# Patient Record
Sex: Female | Born: 1960 | Race: White | Hispanic: No | Smoking: Former smoker
Health system: Southern US, Community
[De-identification: ages and names within clinical notes are randomized; demographics above are authoritative.]

## PROBLEM LIST (undated history)

## (undated) DIAGNOSIS — E039 Hypothyroidism, unspecified: Secondary | ICD-10-CM

## (undated) DIAGNOSIS — C6990 Malignant neoplasm of unspecified site of unspecified eye: Secondary | ICD-10-CM

## (undated) DIAGNOSIS — Z801 Family history of malignant neoplasm of trachea, bronchus and lung: Secondary | ICD-10-CM

## (undated) DIAGNOSIS — F32A Depression, unspecified: Secondary | ICD-10-CM

## (undated) DIAGNOSIS — E785 Hyperlipidemia, unspecified: Secondary | ICD-10-CM

## (undated) DIAGNOSIS — Z803 Family history of malignant neoplasm of breast: Secondary | ICD-10-CM

## (undated) DIAGNOSIS — F419 Anxiety disorder, unspecified: Secondary | ICD-10-CM

## (undated) DIAGNOSIS — K219 Gastro-esophageal reflux disease without esophagitis: Secondary | ICD-10-CM

## (undated) DIAGNOSIS — Z8041 Family history of malignant neoplasm of ovary: Secondary | ICD-10-CM

## (undated) DIAGNOSIS — M1990 Unspecified osteoarthritis, unspecified site: Secondary | ICD-10-CM

## (undated) DIAGNOSIS — J449 Chronic obstructive pulmonary disease, unspecified: Secondary | ICD-10-CM

## (undated) DIAGNOSIS — T7840XA Allergy, unspecified, initial encounter: Secondary | ICD-10-CM

## (undated) DIAGNOSIS — R0600 Dyspnea, unspecified: Secondary | ICD-10-CM

## (undated) DIAGNOSIS — D649 Anemia, unspecified: Secondary | ICD-10-CM

## (undated) DIAGNOSIS — F329 Major depressive disorder, single episode, unspecified: Secondary | ICD-10-CM

## (undated) DIAGNOSIS — C801 Malignant (primary) neoplasm, unspecified: Secondary | ICD-10-CM

## (undated) DIAGNOSIS — Z808 Family history of malignant neoplasm of other organs or systems: Secondary | ICD-10-CM

## (undated) HISTORY — DX: Hyperlipidemia, unspecified: E78.5

## (undated) HISTORY — PX: OTHER SURGICAL HISTORY: SHX169

## (undated) HISTORY — DX: Family history of malignant neoplasm of other organs or systems: Z80.8

## (undated) HISTORY — DX: Malignant neoplasm of unspecified site of unspecified eye: C69.90

## (undated) HISTORY — DX: Gastro-esophageal reflux disease without esophagitis: K21.9

## (undated) HISTORY — DX: Malignant (primary) neoplasm, unspecified: C80.1

## (undated) HISTORY — PX: VAGINAL HYSTERECTOMY: SUR661

## (undated) HISTORY — DX: Family history of malignant neoplasm of ovary: Z80.41

## (undated) HISTORY — PX: BILATERAL CARPAL TUNNEL RELEASE: SHX6508

## (undated) HISTORY — DX: Family history of malignant neoplasm of breast: Z80.3

## (undated) HISTORY — DX: Family history of malignant neoplasm of trachea, bronchus and lung: Z80.1

## (undated) HISTORY — DX: Chronic obstructive pulmonary disease, unspecified: J44.9

## (undated) HISTORY — DX: Anxiety disorder, unspecified: F41.9

## (undated) HISTORY — DX: Anemia, unspecified: D64.9

## (undated) HISTORY — DX: Depression, unspecified: F32.A

## (undated) HISTORY — PX: COLPOSCOPY: SHX161

## (undated) HISTORY — DX: Allergy, unspecified, initial encounter: T78.40XA

## (undated) HISTORY — DX: Major depressive disorder, single episode, unspecified: F32.9

## (undated) HISTORY — DX: Hypothyroidism, unspecified: E03.9

## (undated) HISTORY — DX: Unspecified osteoarthritis, unspecified site: M19.90

---

## 2000-06-18 ENCOUNTER — Other Ambulatory Visit: Admission: RE | Admit: 2000-06-18 | Discharge: 2000-06-18 | Payer: Self-pay | Admitting: Obstetrics and Gynecology

## 2000-07-18 ENCOUNTER — Ambulatory Visit (HOSPITAL_COMMUNITY): Admission: RE | Admit: 2000-07-18 | Discharge: 2000-07-18 | Payer: Self-pay | Admitting: Gastroenterology

## 2001-04-11 ENCOUNTER — Encounter: Payer: Self-pay | Admitting: Family Medicine

## 2001-04-11 ENCOUNTER — Ambulatory Visit (HOSPITAL_COMMUNITY): Admission: RE | Admit: 2001-04-11 | Discharge: 2001-04-11 | Payer: Self-pay | Admitting: Family Medicine

## 2001-08-19 ENCOUNTER — Encounter: Payer: Self-pay | Admitting: Family Medicine

## 2001-08-19 ENCOUNTER — Other Ambulatory Visit: Admission: RE | Admit: 2001-08-19 | Discharge: 2001-08-19 | Payer: Self-pay | Admitting: Family Medicine

## 2001-08-19 ENCOUNTER — Ambulatory Visit (HOSPITAL_COMMUNITY): Admission: RE | Admit: 2001-08-19 | Discharge: 2001-08-19 | Payer: Self-pay | Admitting: Family Medicine

## 2003-07-06 ENCOUNTER — Ambulatory Visit (HOSPITAL_COMMUNITY): Admission: RE | Admit: 2003-07-06 | Discharge: 2003-07-06 | Payer: Self-pay | Admitting: Family Medicine

## 2003-07-06 ENCOUNTER — Encounter: Payer: Self-pay | Admitting: Family Medicine

## 2003-09-30 ENCOUNTER — Ambulatory Visit (HOSPITAL_COMMUNITY): Admission: RE | Admit: 2003-09-30 | Discharge: 2003-09-30 | Payer: Self-pay | Admitting: Family Medicine

## 2003-10-30 ENCOUNTER — Ambulatory Visit (HOSPITAL_COMMUNITY): Admission: RE | Admit: 2003-10-30 | Discharge: 2003-10-30 | Payer: Self-pay | Admitting: Family Medicine

## 2004-04-22 ENCOUNTER — Ambulatory Visit (HOSPITAL_BASED_OUTPATIENT_CLINIC_OR_DEPARTMENT_OTHER): Admission: RE | Admit: 2004-04-22 | Discharge: 2004-04-22 | Payer: Self-pay | Admitting: Orthopedic Surgery

## 2004-05-20 ENCOUNTER — Ambulatory Visit (HOSPITAL_BASED_OUTPATIENT_CLINIC_OR_DEPARTMENT_OTHER): Admission: RE | Admit: 2004-05-20 | Discharge: 2004-05-20 | Payer: Self-pay | Admitting: Orthopedic Surgery

## 2005-07-19 ENCOUNTER — Other Ambulatory Visit: Admission: RE | Admit: 2005-07-19 | Discharge: 2005-07-19 | Payer: Self-pay | Admitting: Obstetrics and Gynecology

## 2005-11-14 ENCOUNTER — Ambulatory Visit (HOSPITAL_COMMUNITY): Admission: RE | Admit: 2005-11-14 | Discharge: 2005-11-14 | Payer: Self-pay | Admitting: Family Medicine

## 2006-01-09 ENCOUNTER — Ambulatory Visit (HOSPITAL_COMMUNITY): Admission: RE | Admit: 2006-01-09 | Discharge: 2006-01-09 | Payer: Self-pay | Admitting: Ophthalmology

## 2010-10-16 ENCOUNTER — Encounter: Payer: Self-pay | Admitting: Pulmonary Disease

## 2012-04-18 ENCOUNTER — Other Ambulatory Visit (HOSPITAL_COMMUNITY): Payer: Self-pay | Admitting: Physician Assistant

## 2012-04-18 DIAGNOSIS — Z1231 Encounter for screening mammogram for malignant neoplasm of breast: Secondary | ICD-10-CM

## 2012-04-23 ENCOUNTER — Ambulatory Visit (HOSPITAL_COMMUNITY)
Admission: RE | Admit: 2012-04-23 | Discharge: 2012-04-23 | Disposition: A | Discharge status: Home / Self Care | Payer: Self-pay | Source: Ambulatory Visit | Attending: Physician Assistant | Admitting: Physician Assistant

## 2012-04-23 DIAGNOSIS — Z1231 Encounter for screening mammogram for malignant neoplasm of breast: Secondary | ICD-10-CM

## 2012-04-30 ENCOUNTER — Other Ambulatory Visit: Payer: Self-pay | Admitting: Physician Assistant

## 2012-04-30 DIAGNOSIS — R928 Other abnormal and inconclusive findings on diagnostic imaging of breast: Secondary | ICD-10-CM

## 2012-05-08 ENCOUNTER — Other Ambulatory Visit (HOSPITAL_COMMUNITY): Payer: Self-pay | Admitting: *Deleted

## 2012-05-08 DIAGNOSIS — R928 Other abnormal and inconclusive findings on diagnostic imaging of breast: Secondary | ICD-10-CM

## 2012-05-15 ENCOUNTER — Other Ambulatory Visit (HOSPITAL_COMMUNITY): Payer: Self-pay | Admitting: *Deleted

## 2012-05-15 ENCOUNTER — Encounter (HOSPITAL_COMMUNITY): Payer: Self-pay

## 2012-05-15 ENCOUNTER — Ambulatory Visit (HOSPITAL_COMMUNITY)
Admission: RE | Admit: 2012-05-15 | Discharge: 2012-05-15 | Disposition: A | Discharge status: Home / Self Care | Payer: PRIVATE HEALTH INSURANCE | Source: Ambulatory Visit | Attending: *Deleted | Admitting: *Deleted

## 2012-05-15 DIAGNOSIS — R928 Other abnormal and inconclusive findings on diagnostic imaging of breast: Secondary | ICD-10-CM

## 2013-01-22 ENCOUNTER — Other Ambulatory Visit (HOSPITAL_COMMUNITY): Payer: Self-pay | Admitting: Physician Assistant

## 2013-01-22 DIAGNOSIS — N838 Other noninflammatory disorders of ovary, fallopian tube and broad ligament: Secondary | ICD-10-CM

## 2013-01-24 ENCOUNTER — Ambulatory Visit (HOSPITAL_COMMUNITY): Payer: Self-pay

## 2013-01-24 ENCOUNTER — Ambulatory Visit (HOSPITAL_COMMUNITY)
Admission: RE | Admit: 2013-01-24 | Discharge: 2013-01-24 | Disposition: A | Discharge status: Home / Self Care | Payer: Self-pay | Source: Ambulatory Visit | Attending: Physician Assistant | Admitting: Physician Assistant

## 2013-01-24 ENCOUNTER — Other Ambulatory Visit (HOSPITAL_COMMUNITY): Payer: Self-pay | Admitting: Physician Assistant

## 2013-01-24 DIAGNOSIS — N83209 Unspecified ovarian cyst, unspecified side: Secondary | ICD-10-CM | POA: Insufficient documentation

## 2013-01-24 DIAGNOSIS — N838 Other noninflammatory disorders of ovary, fallopian tube and broad ligament: Secondary | ICD-10-CM

## 2013-01-24 DIAGNOSIS — M25559 Pain in unspecified hip: Secondary | ICD-10-CM | POA: Insufficient documentation

## 2013-07-10 ENCOUNTER — Other Ambulatory Visit (HOSPITAL_COMMUNITY): Payer: Self-pay | Admitting: Physician Assistant

## 2013-07-10 DIAGNOSIS — R109 Unspecified abdominal pain: Secondary | ICD-10-CM

## 2013-07-15 ENCOUNTER — Ambulatory Visit (HOSPITAL_COMMUNITY)
Admission: RE | Admit: 2013-07-15 | Discharge: 2013-07-15 | Disposition: A | Discharge status: Home / Self Care | Payer: PRIVATE HEALTH INSURANCE | Source: Ambulatory Visit | Attending: Physician Assistant | Admitting: Physician Assistant

## 2013-07-15 ENCOUNTER — Other Ambulatory Visit (HOSPITAL_COMMUNITY): Payer: Self-pay | Admitting: Physician Assistant

## 2013-07-15 DIAGNOSIS — R109 Unspecified abdominal pain: Secondary | ICD-10-CM

## 2013-07-15 DIAGNOSIS — R52 Pain, unspecified: Secondary | ICD-10-CM

## 2013-07-15 DIAGNOSIS — M51379 Other intervertebral disc degeneration, lumbosacral region without mention of lumbar back pain or lower extremity pain: Secondary | ICD-10-CM | POA: Insufficient documentation

## 2013-07-15 DIAGNOSIS — M5137 Other intervertebral disc degeneration, lumbosacral region: Secondary | ICD-10-CM | POA: Insufficient documentation

## 2013-07-15 DIAGNOSIS — M545 Low back pain, unspecified: Secondary | ICD-10-CM | POA: Insufficient documentation

## 2013-08-12 ENCOUNTER — Other Ambulatory Visit (HOSPITAL_COMMUNITY): Payer: Self-pay | Admitting: Physician Assistant

## 2013-08-12 DIAGNOSIS — Z139 Encounter for screening, unspecified: Secondary | ICD-10-CM

## 2013-08-15 ENCOUNTER — Ambulatory Visit (HOSPITAL_COMMUNITY)
Admission: RE | Admit: 2013-08-15 | Discharge: 2013-08-15 | Disposition: A | Discharge status: Home / Self Care | Payer: PRIVATE HEALTH INSURANCE | Source: Ambulatory Visit | Attending: Physician Assistant | Admitting: Physician Assistant

## 2013-08-15 DIAGNOSIS — Z139 Encounter for screening, unspecified: Secondary | ICD-10-CM

## 2013-08-19 ENCOUNTER — Ambulatory Visit (HOSPITAL_COMMUNITY): Payer: Self-pay

## 2013-08-20 ENCOUNTER — Other Ambulatory Visit: Payer: Self-pay | Admitting: Physician Assistant

## 2013-08-20 DIAGNOSIS — R928 Other abnormal and inconclusive findings on diagnostic imaging of breast: Secondary | ICD-10-CM

## 2013-09-08 ENCOUNTER — Other Ambulatory Visit (HOSPITAL_COMMUNITY): Payer: Self-pay | Admitting: *Deleted

## 2013-09-08 DIAGNOSIS — R928 Other abnormal and inconclusive findings on diagnostic imaging of breast: Secondary | ICD-10-CM

## 2013-09-10 ENCOUNTER — Encounter (HOSPITAL_COMMUNITY): Payer: Self-pay

## 2013-09-24 ENCOUNTER — Ambulatory Visit (HOSPITAL_COMMUNITY)
Admission: RE | Admit: 2013-09-24 | Discharge: 2013-09-24 | Disposition: A | Discharge status: Home / Self Care | Payer: PRIVATE HEALTH INSURANCE | Source: Ambulatory Visit | Attending: *Deleted | Admitting: *Deleted

## 2013-09-24 ENCOUNTER — Other Ambulatory Visit (HOSPITAL_COMMUNITY): Payer: Self-pay | Admitting: *Deleted

## 2013-09-24 ENCOUNTER — Encounter (HOSPITAL_COMMUNITY): Payer: Self-pay

## 2013-09-24 DIAGNOSIS — R928 Other abnormal and inconclusive findings on diagnostic imaging of breast: Secondary | ICD-10-CM

## 2013-12-25 ENCOUNTER — Ambulatory Visit: Payer: Self-pay | Admitting: Cardiology

## 2014-01-19 ENCOUNTER — Ambulatory Visit: Payer: Self-pay | Admitting: Cardiology

## 2014-01-20 ENCOUNTER — Ambulatory Visit (INDEPENDENT_AMBULATORY_CARE_PROVIDER_SITE_OTHER): Payer: Self-pay | Admitting: Cardiology

## 2014-01-20 ENCOUNTER — Encounter: Payer: Self-pay | Admitting: Cardiology

## 2014-01-20 VITALS — BP 103/55 | HR 75 | Ht 62.0 in | Wt 127.0 lb

## 2014-01-20 DIAGNOSIS — R0602 Shortness of breath: Secondary | ICD-10-CM

## 2014-01-20 DIAGNOSIS — E785 Hyperlipidemia, unspecified: Secondary | ICD-10-CM | POA: Insufficient documentation

## 2014-01-20 DIAGNOSIS — R079 Chest pain, unspecified: Secondary | ICD-10-CM

## 2014-01-20 DIAGNOSIS — E039 Hypothyroidism, unspecified: Secondary | ICD-10-CM | POA: Insufficient documentation

## 2014-01-20 NOTE — Patient Instructions (Signed)
Your physician recommends that you schedule a follow-up appointment in to be determined after echo test     Your physician has requested that you have an echocardiogram. Echocardiography is a painless test that uses sound waves to create images of your heart. It provides your doctor with information about the size and shape of your heart and how well your heart's chambers and valves are working. This procedure takes approximately one hour. There are no restrictions for this procedure.   Your physician recommends that you continue on your current medications as directed. Please refer to the Current Medication list given to you today.  Thank you for choosing [facility] !

## 2014-01-20 NOTE — Progress Notes (Signed)
Clinical Summary Kathleen Erickson is a 53 y.o.female seen today as a new patient, she is referred for chest pain.   1. Chest pain - started 1 month ago. Sharp pain midchest, 7/10. Can occur any time but more often with activity. +palpitations. Dizzy. Nothing makes better worst. Lasts for few minutes. Occurs daily, can occur up to 3 times a day.  - +DOE at 1 block, which is fairly new. + LE edema, no orthopnea.   CAD risk factors: HL, mother with "heart troubles" in 57s, father MI 72s. Maternal aunt with heart troubles. + tobacco x 40 years.    PMH 1. Hyperlipidemia 2. Hypothyroidism   Allergies  Allergen Reactions  . Morphine And Related     Hives   . Penicillins     hives     Current Outpatient Prescriptions  Medication Sig Dispense Refill  . bisacodyl (DULCOLAX) 5 MG EC tablet Take 5 mg by mouth daily as needed for moderate constipation.      . Calcium 600-200 MG-UNIT per tablet Take 1 tablet by mouth daily.      Marland Kitchen levothyroxine (SYNTHROID, LEVOTHROID) 25 MCG tablet Take 25 mcg by mouth daily before breakfast.      . Multiple Vitamins-Minerals (MULTIVITAMIN WITH MINERALS) tablet Take 1 tablet by mouth daily.      . Omega-3 Fatty Acids (FISH OIL) 1000 MG CAPS Take 1,200 mg by mouth daily.      . simvastatin (ZOCOR) 20 MG tablet Take 20 mg by mouth daily.       No current facility-administered medications for this visit.     No past surgical history on file.   Allergies  Allergen Reactions  . Morphine And Related     Hives   . Penicillins     hives      No family history on file.   Social History [name] reports that she has been smoking.  She does not have any smokeless tobacco history on file. [name] has no alcohol history on file.   Review of Systems CONSTITUTIONAL: No weight loss, fever, chills, weakness or fatigue.  HEENT: Eyes: No visual loss, blurred vision, double vision or yellow sclerae.No hearing loss, sneezing, congestion, runny nose  or sore throat.  SKIN: No rash or itching.  CARDIOVASCULAR: per HPI RESPIRATORY: No cough or sputum.  GASTROINTESTINAL: No anorexia, nausea, vomiting or diarrhea. No abdominal pain or blood.  GENITOURINARY: No burning on urination, no polyuria NEUROLOGICAL: No headache, dizziness, syncope, paralysis, ataxia, numbness or tingling in the extremities. No change in bowel or bladder control.  MUSCULOSKELETAL: No muscle, back pain, joint pain or stiffness.  LYMPHATICS: No enlarged nodes. No history of splenectomy.  PSYCHIATRIC: No history of depression or anxiety.  ENDOCRINOLOGIC: No reports of sweating, cold or heat intolerance. No polyuria or polydipsia.  Marland Kitchen   Physical Examination Filed Vitals:   01/20/14 1446  BP: 103/55  Pulse: 75   Filed Weights   01/20/14 1446  Weight: 127 lb (57.607 kg)    Gen: resting comfortably, no acute distress HEENT: no scleral icterus, pupils equal round and reactive, no palptable cervical adenopathy,  CV: RRR, no m/r/g, no JVD, no carotid bruits Resp: Clear to auscultation bilaterally GI: abdomen is soft, non-tender, non-distended, normal bowel sounds, no hepatosplenomegaly MSK: extremities are warm, no edema.  Skin: warm, no rash Neuro:  no focal deficits Psych: appropriate affect   Diagnostic Studies EKG NSR    Assessment and Plan   1. Chest  pain - describes history of intermittent chest pain with worsening symptoms of DOE and LE edema - will obtain echo to evaluate for possible LV dysfunction to explain her symptoms. Pending results, she will likely need an ischemic evaluation as well   F/u pending results of echo.   Arnoldo Lenis, M.D., F.A.C.C.

## 2014-01-23 ENCOUNTER — Ambulatory Visit (HOSPITAL_COMMUNITY)
Admission: RE | Admit: 2014-01-23 | Discharge: 2014-01-23 | Disposition: A | Discharge status: Home / Self Care | Payer: PRIVATE HEALTH INSURANCE | Source: Ambulatory Visit | Attending: Cardiology | Admitting: Cardiology

## 2014-01-23 DIAGNOSIS — R0602 Shortness of breath: Secondary | ICD-10-CM | POA: Insufficient documentation

## 2014-01-23 DIAGNOSIS — E785 Hyperlipidemia, unspecified: Secondary | ICD-10-CM | POA: Insufficient documentation

## 2014-01-23 DIAGNOSIS — I059 Rheumatic mitral valve disease, unspecified: Secondary | ICD-10-CM

## 2014-01-23 DIAGNOSIS — F17200 Nicotine dependence, unspecified, uncomplicated: Secondary | ICD-10-CM | POA: Insufficient documentation

## 2014-01-23 DIAGNOSIS — R079 Chest pain, unspecified: Secondary | ICD-10-CM | POA: Insufficient documentation

## 2014-01-23 NOTE — Progress Notes (Signed)
*  PRELIMINARY RESULTS* Echocardiogram 2D Echocardiogram has been performed.  Kathleen Erickson 01/23/2014, 11:25 AM

## 2014-01-26 ENCOUNTER — Telehealth: Payer: Self-pay | Admitting: *Deleted

## 2014-01-26 ENCOUNTER — Encounter: Payer: Self-pay | Admitting: *Deleted

## 2014-01-26 DIAGNOSIS — R079 Chest pain, unspecified: Secondary | ICD-10-CM

## 2014-01-26 NOTE — Telephone Encounter (Signed)
Message copied by Truett Mainland on Mon Jan 26, 2014  3:11 PM ------      Message from: U[place] F      Created: Mon Jan 26, 2014  9:12 AM       Normal echo overall. Patient is going to need a stress test. Please ask if patient is able to run on a treadmill, if so order a stress echo. If not able to run on a treadmill will need a Lexiscan. No meds need to held for either study.            Zandra Abts MD ------

## 2014-01-26 NOTE — Telephone Encounter (Signed)
Pt made aware to get a stress echo. Tried to call pt back to let her know of appointment with no answer. Appointment made for Friday Jan 30, 2014 @ 9:30 am, pt needs to register at front desk of [facility] at 8:30 am. Durene Cal letter in mail

## 2014-01-30 ENCOUNTER — Ambulatory Visit (HOSPITAL_COMMUNITY)
Admission: RE | Admit: 2014-01-30 | Discharge: 2014-01-30 | Disposition: A | Discharge status: Home / Self Care | Payer: PRIVATE HEALTH INSURANCE | Source: Ambulatory Visit | Attending: Cardiology | Admitting: Cardiology

## 2014-01-30 ENCOUNTER — Encounter (HOSPITAL_COMMUNITY): Payer: Self-pay

## 2014-01-30 DIAGNOSIS — R079 Chest pain, unspecified: Secondary | ICD-10-CM | POA: Insufficient documentation

## 2014-01-30 DIAGNOSIS — R072 Precordial pain: Secondary | ICD-10-CM

## 2014-01-30 DIAGNOSIS — R0609 Other forms of dyspnea: Secondary | ICD-10-CM | POA: Insufficient documentation

## 2014-01-30 DIAGNOSIS — F17200 Nicotine dependence, unspecified, uncomplicated: Secondary | ICD-10-CM | POA: Insufficient documentation

## 2014-01-30 DIAGNOSIS — E785 Hyperlipidemia, unspecified: Secondary | ICD-10-CM | POA: Insufficient documentation

## 2014-01-30 DIAGNOSIS — R0989 Other specified symptoms and signs involving the circulatory and respiratory systems: Secondary | ICD-10-CM | POA: Insufficient documentation

## 2014-01-30 NOTE — Progress Notes (Signed)
  Echocardiogram Echocardiogram Stress Test has been performed.  Kathleen Erickson 01/30/2014, 9:50 AM

## 2014-01-30 NOTE — Progress Notes (Signed)
Stress Lab Nurses Notes - [facility] 09/30/567 Reason for doing test: Chest Pain and Dyspnea Type of test: Stress Echo Nurse performing test: Gerrit Halls, RN Nuclear Medicine Tech: Not Applicable Echo Tech: Jamison Neighbor MD performing test: Myles Gip Family MD: Royce Macadamia PA Test explained and consent signed: yes IV started: No IV started Symptoms: SOB & R hip discomfort Treatment/Intervention: None Reason test stopped: fatigue and reached target HR After recovery IV was: NA Patient to return to [facility]. Med at : NA Patient discharged: Home Patient's Condition upon discharge was: stable Comments: During test peak BP 165/49 & HR 150.  Recovery BP 106/61 & HR 76.  Symptoms resolved in recovery. Donnajean Lopes

## 2014-04-02 ENCOUNTER — Other Ambulatory Visit (HOSPITAL_COMMUNITY): Payer: Self-pay | Admitting: *Deleted

## 2014-04-02 DIAGNOSIS — R928 Other abnormal and inconclusive findings on diagnostic imaging of breast: Secondary | ICD-10-CM

## 2014-04-21 ENCOUNTER — Ambulatory Visit (HOSPITAL_COMMUNITY)
Admission: RE | Admit: 2014-04-21 | Discharge: 2014-04-21 | Disposition: A | Discharge status: Home / Self Care | Payer: PRIVATE HEALTH INSURANCE | Source: Ambulatory Visit | Attending: *Deleted | Admitting: *Deleted

## 2014-04-21 DIAGNOSIS — R928 Other abnormal and inconclusive findings on diagnostic imaging of breast: Secondary | ICD-10-CM | POA: Insufficient documentation

## 2014-04-27 ENCOUNTER — Encounter: Payer: Self-pay | Admitting: Internal Medicine

## 2014-05-29 ENCOUNTER — Encounter: Payer: Self-pay | Admitting: Gastroenterology

## 2014-05-29 ENCOUNTER — Ambulatory Visit (INDEPENDENT_AMBULATORY_CARE_PROVIDER_SITE_OTHER): Payer: PRIVATE HEALTH INSURANCE | Admitting: Gastroenterology

## 2014-05-29 VITALS — BP 106/61 | HR 62 | Temp 97.0°F | Ht 61.0 in | Wt 126.4 lb

## 2014-05-29 DIAGNOSIS — D649 Anemia, unspecified: Secondary | ICD-10-CM

## 2014-05-29 DIAGNOSIS — K219 Gastro-esophageal reflux disease without esophagitis: Secondary | ICD-10-CM | POA: Insufficient documentation

## 2014-05-29 DIAGNOSIS — K625 Hemorrhage of anus and rectum: Secondary | ICD-10-CM

## 2014-05-29 DIAGNOSIS — K5900 Constipation, unspecified: Secondary | ICD-10-CM

## 2014-05-29 DIAGNOSIS — D509 Iron deficiency anemia, unspecified: Secondary | ICD-10-CM | POA: Insufficient documentation

## 2014-05-29 DIAGNOSIS — R1013 Epigastric pain: Secondary | ICD-10-CM

## 2014-05-29 MED ORDER — PEG 3350-KCL-NA BICARB-NACL 420 G PO SOLR: 4000.0000 mL | ORAL | Status: DC

## 2014-05-29 NOTE — Patient Instructions (Signed)
1. Colonoscopy and upper endoscopy with Dr. Rourk. See separate instructions. 

## 2014-05-29 NOTE — Assessment & Plan Note (Signed)
Chronic constipation on chronic Dulcolax. Intermittent rectal bleeding possibly benign anorectal source. Anemia, more recent labs requested. She is due for colonoscopy at this time. I have discussed the risks, alternatives, benefits with regards to but not limited to the risk of reaction to medication, bleeding, infection, perforation and the patient is agreeable to proceed. Written consent to be obtained.  Difficultly with management of symptoms without capabilities of affording medications. Will await EGD/TCS findings. Then can make further recommendations for medication. Patient is interested in [facility] banding in the future if appropriate.

## 2014-05-29 NOTE — Progress Notes (Signed)
Primary Care Physician:  Montey Hora  Primary Gastroenterologist:  Garfield Cornea, MD   Chief Complaint  Patient presents with  . Abdominal Pain    HPI:  Kathleen Erickson is a 53 y.o. female here at the request of Soyla Dryer, PA-C with [facility] for further evaluation of abdominal pain, possible gastritis.  Patient is somewhat of a poor historian. Some recent onset heartburn. Stays sore in epigastrium. Recently placed on pantoprazole which seemed to help some of her burning. Unable to continue to afford it therefore brought Nexium OTC which she has been on 20 mg daily without significant improvement of symptoms. No N/V. Rare dysphagia. Mid-abdominal pain, uncomfortable, rolling. Symptoms worse postprandially. Denies any weight loss. Chronically constipated. Has been on Dulcolax for years. Takes 2 daily. BM were watery when first started Celexa. Now having more constipation again. Occasional brbpr with small clots. Hemorrhoids? No melena. Remote colonoscopy about 15 years ago. Does not recall the findings. Anemia off/on "all my life". Was taking NSAIDs or hip/arthritis, Aleve. No aspirin powders.    Current Outpatient Prescriptions  Medication Sig Dispense Refill  . bisacodyl (DULCOLAX) 5 MG EC tablet Take 5 mg by mouth daily as needed for moderate constipation.      . Calcium 600-200 MG-UNIT per tablet Take 1 tablet by mouth daily.      . citalopram (CELEXA) 20 MG tablet Take 20 mg by mouth daily.      Marland Kitchen esomeprazole (NEXIUM) 20 MG capsule Take 20 mg by mouth daily at 12 noon.      Marland Kitchen levothyroxine (SYNTHROID, LEVOTHROID) 25 MCG tablet Take 25 mcg by mouth daily before breakfast.      . Multiple Vitamins-Minerals (MULTIVITAMIN WITH MINERALS) tablet Take 1 tablet by mouth daily.      . Omega-3 Fatty Acids (FISH OIL) 1000 MG CAPS Take 1,200 mg by mouth daily.      . simvastatin (ZOCOR) 20 MG tablet Take 20 mg by mouth daily.       No current  facility-administered medications for this visit.    Allergies as of 05/29/2014 - Review Complete 05/29/2014  Allergen Reaction Noted  . Morphine and related  01/20/2014  . Penicillins  01/20/2014    Past Medical History  Diagnosis Date  . Hypothyroidism   . Anemia   . Depression   . Hyperlipidemia     Past Surgical History  Procedure Laterality Date  . Vaginal hysterectomy    . Cesarean section    . Colposcopy    . Carpel tunnel release      bilaterally    Family History  Problem Relation Age of Onset  . Colon cancer Neg Hx   . Ulcers Neg Hx   . Cancer Other     ulcer, aunt  . Lung cancer Cousin   . Throat cancer Cousin   . Uterine cancer Mother   . Heart disease Father     age 25, family questions autopsy    History   Social History  . Marital Status: Married    Spouse Name: N/A    Number of Children: 2  . Years of Education: N/A   Occupational History  . Not on file.   Social History Main Topics  . Smoking status: Current Every Day Smoker  . Smokeless tobacco: Not on file  . Alcohol Use: No  . Drug Use: No  . Sexual Activity: Not on file   Other Topics Concern  . Not  on file   Social History Narrative  . No narrative on file      ROS:  General: Negative for anorexia, weight loss, fever, chills, fatigue, weakness. Eyes: Negative for vision changes.  ENT: Negative for hoarseness, difficulty swallowing , nasal congestion. CV: Negative for chest pain, angina, palpitations, dyspnea on exertion, peripheral edema.  Respiratory: Negative for dyspnea at rest, dyspnea on exertion, cough, sputum, wheezing.  GI: See history of present illness. GU:  Negative for dysuria, hematuria, urinary incontinence, urinary frequency, nocturnal urination.  MS: Negative for  low back pain. Chronic hip pain Derm: Negative for rash or itching.  Neuro: Negative for weakness, abnormal sensation, seizure, frequent headaches, memory loss, confusion.  Psych: Negative for  anxiety, depression, suicidal ideation, hallucinations.  Endo: Negative for unusual weight change.  Heme: Negative for bruising or bleeding. Allergy: Negative for rash or hives.    Physical Examination:  BP 106/61  Pulse 62  Temp(Src) 97 F (36.1 C) (Oral)  Ht 5\' 1"  (1.549 m)  Wt 126 lb 6.4 oz (57.335 kg)  BMI 23.90 kg/m2   General: Well-nourished, well-developed in no acute distress.  Head: Normocephalic, atraumatic.   Eyes: Conjunctiva pink, no icterus. Mouth: Oropharyngeal mucosa moist and pink , no lesions erythema or exudate. Neck: Supple without thyromegaly, masses, or lymphadenopathy.  Lungs: Clear to auscultation bilaterally.  Heart: Regular rate and rhythm, no murmurs rubs or gallops.  Abdomen: Bowel sounds are normal, epigastric tenderness, nondistended, no hepatosplenomegaly or masses, no abdominal bruits or    hernia , no rebound or guarding.   Rectal: Not performed Extremities: No lower extremity edema. No clubbing or deformities.  Neuro: Alert and oriented x 4 , grossly normal neurologically.  Skin: Warm and dry, no rash or jaundice.   Psych: Alert and cooperative, normal mood and affect.  Labs: 02/28/14: Hgb 10.6  Imaging Studies: No results found.

## 2014-05-29 NOTE — Assessment & Plan Note (Signed)
53 year old lady with several month history of new onset heartburn, dyspepsia, epigastric pain in the setting of Aleve. Initially some improvement on pantoprazole but she had to switch to Nexium over-the-counter due to finances. Continues to have significant abdominal pain particularly in the epigastrium. History of anemia with hemoglobin 10.6 a few months ago. Have requested more current labs for review.  Plan on upper endoscopy for diagnostic purposes. Recommend avoiding NSAIDs for now. Continue Nexium OTC as before.  I have discussed the risks, alternatives, benefits with regards to but not limited to the risk of reaction to medication, bleeding, infection, perforation and the patient is agreeable to proceed. Written consent to be obtained.

## 2014-05-29 NOTE — Progress Notes (Signed)
CC TO PCP °

## 2014-06-03 ENCOUNTER — Encounter (HOSPITAL_COMMUNITY): Payer: Self-pay | Admitting: Pharmacy Technician

## 2014-06-22 ENCOUNTER — Encounter (HOSPITAL_COMMUNITY)
Admission: RE | Disposition: A | Discharge status: Home / Self Care | Payer: Self-pay | Source: Ambulatory Visit | Attending: Internal Medicine

## 2014-06-22 ENCOUNTER — Ambulatory Visit (HOSPITAL_COMMUNITY)
Admission: RE | Admit: 2014-06-22 | Discharge: 2014-06-22 | Disposition: A | Discharge status: Home / Self Care | Payer: Self-pay | Source: Ambulatory Visit | Attending: Internal Medicine | Admitting: Internal Medicine

## 2014-06-22 ENCOUNTER — Encounter (HOSPITAL_COMMUNITY): Payer: Self-pay

## 2014-06-22 DIAGNOSIS — K3189 Other diseases of stomach and duodenum: Secondary | ICD-10-CM | POA: Insufficient documentation

## 2014-06-22 DIAGNOSIS — K257 Chronic gastric ulcer without hemorrhage or perforation: Secondary | ICD-10-CM

## 2014-06-22 DIAGNOSIS — F3289 Other specified depressive episodes: Secondary | ICD-10-CM | POA: Insufficient documentation

## 2014-06-22 DIAGNOSIS — E039 Hypothyroidism, unspecified: Secondary | ICD-10-CM | POA: Insufficient documentation

## 2014-06-22 DIAGNOSIS — E785 Hyperlipidemia, unspecified: Secondary | ICD-10-CM | POA: Insufficient documentation

## 2014-06-22 DIAGNOSIS — K625 Hemorrhage of anus and rectum: Secondary | ICD-10-CM

## 2014-06-22 DIAGNOSIS — K5900 Constipation, unspecified: Secondary | ICD-10-CM

## 2014-06-22 DIAGNOSIS — Z79899 Other long term (current) drug therapy: Secondary | ICD-10-CM | POA: Insufficient documentation

## 2014-06-22 DIAGNOSIS — K921 Melena: Secondary | ICD-10-CM | POA: Insufficient documentation

## 2014-06-22 DIAGNOSIS — K644 Residual hemorrhoidal skin tags: Secondary | ICD-10-CM | POA: Insufficient documentation

## 2014-06-22 DIAGNOSIS — K643 Fourth degree hemorrhoids: Secondary | ICD-10-CM

## 2014-06-22 DIAGNOSIS — D649 Anemia, unspecified: Secondary | ICD-10-CM | POA: Insufficient documentation

## 2014-06-22 DIAGNOSIS — K648 Other hemorrhoids: Secondary | ICD-10-CM | POA: Insufficient documentation

## 2014-06-22 DIAGNOSIS — K259 Gastric ulcer, unspecified as acute or chronic, without hemorrhage or perforation: Secondary | ICD-10-CM | POA: Insufficient documentation

## 2014-06-22 DIAGNOSIS — K219 Gastro-esophageal reflux disease without esophagitis: Secondary | ICD-10-CM

## 2014-06-22 DIAGNOSIS — K649 Unspecified hemorrhoids: Secondary | ICD-10-CM

## 2014-06-22 DIAGNOSIS — R1013 Epigastric pain: Secondary | ICD-10-CM

## 2014-06-22 DIAGNOSIS — F329 Major depressive disorder, single episode, unspecified: Secondary | ICD-10-CM | POA: Insufficient documentation

## 2014-06-22 HISTORY — PX: COLONOSCOPY: SHX5424

## 2014-06-22 HISTORY — PX: ESOPHAGOGASTRODUODENOSCOPY: SHX5428

## 2014-06-22 SURGERY — COLONOSCOPY
Anesthesia: Moderate Sedation

## 2014-06-22 MED ORDER — ONDANSETRON HCL 4 MG/2ML IJ SOLN
INTRAMUSCULAR | Status: DC | PRN
Start: 2014-06-22 — End: 2014-06-22
  Administered 2014-06-22: 4 mg via INTRAVENOUS

## 2014-06-22 MED ORDER — MEPERIDINE HCL 100 MG/ML IJ SOLN
INTRAMUSCULAR | Status: DC | PRN
  Administered 2014-06-22: 25 mg via INTRAVENOUS
  Administered 2014-06-22: 50 mg
  Administered 2014-06-22: 25 mg via INTRAVENOUS

## 2014-06-22 MED ORDER — MIDAZOLAM HCL 5 MG/5ML IJ SOLN
INTRAMUSCULAR | Status: AC
  Filled 2014-06-22: qty 10

## 2014-06-22 MED ORDER — LIDOCAINE VISCOUS 2 % MT SOLN
OROMUCOSAL | Status: DC | PRN
  Administered 2014-06-22 (×2): 2 mL via OROMUCOSAL

## 2014-06-22 MED ORDER — MIDAZOLAM HCL 5 MG/5ML IJ SOLN
INTRAMUSCULAR | Status: DC | PRN
  Administered 2014-06-22: 2 mg via INTRAVENOUS
  Administered 2014-06-22 (×3): 1 mg via INTRAVENOUS

## 2014-06-22 MED ORDER — SODIUM CHLORIDE 0.9 % IV SOLN
INTRAVENOUS | Status: DC
  Administered 2014-06-22: 10:00:00 via INTRAVENOUS

## 2014-06-22 MED ORDER — ONDANSETRON HCL 4 MG/2ML IJ SOLN
INTRAMUSCULAR | Status: AC
  Filled 2014-06-22: qty 2

## 2014-06-22 MED ORDER — LIDOCAINE VISCOUS 2 % MT SOLN
OROMUCOSAL | Status: AC
  Filled 2014-06-22: qty 15

## 2014-06-22 MED ORDER — STERILE WATER FOR IRRIGATION IR SOLN
Status: DC | PRN
  Administered 2014-06-22: 12:00:00

## 2014-06-22 MED ORDER — MEPERIDINE HCL 100 MG/ML IJ SOLN
INTRAMUSCULAR | Status: AC
  Filled 2014-06-22: qty 2

## 2014-06-22 NOTE — Interval H&P Note (Signed)
History and Physical Interval Note:  6/65/9935 70:17 AM  Kathleen Erickson  has presented today for surgery, with the diagnosis of RECTAL BLEED, ANEMIA, EPIGASTRIC PAIN, GERD, CONSTIPATION  The various methods of treatment have been discussed with the patient and family. After consideration of risks, benefits and other options for treatment, the patient has consented to  Procedure(s) with comments: COLONOSCOPY (N/A) - 1115 ESOPHAGOGASTRODUODENOSCOPY (EGD) (N/A) as a surgical intervention .  The patient's history has been reviewed, patient examined, no change in status, stable for surgery.  I have reviewed the patient's chart and labs.  Questions were answered to the patient's satisfaction.     Tram Wrenn  No change

## 2014-06-22 NOTE — Op Note (Signed)
[facility] [address] [place], [address]   COLONOSCOPY PROCEDURE REPORT  PATIENT: Kathleen Erickson, Kathleen Erickson  MR#: [id number] BIRTHDATE: Jan 04, 1961 , 70  yrs. old GENDER: female ENDOSCOPIST: R.  Garfield Cornea, MD FACP [facility] REFERRED YJ:EHUDJSH McElroy, PA-C PROCEDURE DATE:  06/22/2014 PROCEDURE:   Colonoscopy, diagnostic INDICATIONS:hematochezia. MEDICATIONS: Versed and Demerol in incremental doses.  Zofran 4 mg IV ASA CLASS:       Class II  CONSENT: The risks, benefits, alternatives and imponderables including but not limited to bleeding, perforation as well as the possibility of a missed lesion have been reviewed.  The potential for biopsy, lesion removal, etc. have also been discussed. Questions have been answered.  All parties agreeable.  Please see the history and physical in the medical record for more information.  DESCRIPTION OF PROCEDURE:   After the risks benefits and alternatives of the procedure were thoroughly explained, informed consent was obtained.  The digital rectal exam      The [id number] ([id number])  endoscope was introduced through the anus and advanced to the   . No adverse events experienced.   The quality of the prep was adequate.  The instrument was then slowly withdrawn as the colon was fully examined.    Retroflexed views revealed internal Grade III hemorrhoids. Aside from hemorrhoids, normal-appearing colonic, rectal and distal 10 cm of terminal ileal mucosa.  Withdrawal time=6 minutes 0 seconds.  The scope was withdrawn and the procedure completed. COMPLICATIONS: There were no immediate complications.  ENDOSCOPIC IMPRESSION: External and internal hemorrhoids-grade 4; otherwise normal rectum, total colon and terminal ileum.  RECOMMENDATIONS: Treat constipation -- begin Linzess 145 daily.  patient with grade 4 hemorrhoids.  She would not be a good banding candidate.  Office visit with [place] in one month.  See EGD report.  eSigned:  R.  Garfield Cornea, MD FACP [facility] 06/22/2014 1:32 PM   cc:

## 2014-06-22 NOTE — Discharge Instructions (Signed)
Colonoscopy Discharge Instructions  Read the instructions outlined below and refer to this sheet in the next few weeks. These discharge instructions provide you with general information on caring for yourself after you leave the hospital. Your doctor may also give you specific instructions. While your treatment has been planned according to the most current medical practices available, unavoidable complications occasionally occur. If you have any problems or questions after discharge, call Dr. Gala Romney at [phone]. ACTIVITY  You may resume your regular activity, but move at a slower pace for the next 24 hours.   Take frequent rest periods for the next 24 hours.   Walking will help get rid of the air and reduce the bloated feeling in your belly (abdomen).   No driving for 24 hours (because of the medicine (anesthesia) used during the test).    Do not sign any important legal documents or operate any machinery for 24 hours (because of the anesthesia used during the test).  NUTRITION  Drink plenty of fluids.   You may resume your normal diet as instructed by your doctor.   Begin with a light meal and progress to your normal diet. Heavy or fried foods are harder to digest and may make you feel sick to your stomach (nauseated).   Avoid alcoholic beverages for 24 hours or as instructed.  MEDICATIONS  You may resume your normal medications unless your doctor tells you otherwise.  WHAT YOU CAN EXPECT TODAY  Some feelings of bloating in the abdomen.   Passage of more gas than usual.   Spotting of blood in your stool or on the toilet paper.  IF YOU HAD POLYPS REMOVED DURING THE COLONOSCOPY:  No aspirin products for 7 days or as instructed.   No alcohol for 7 days or as instructed.   Eat a soft diet for the next 24 hours.  FINDING OUT THE RESULTS OF YOUR TEST Not all test results are available during your visit. If your test results are not back during the visit, make an appointment  with your caregiver to find out the results. Do not assume everything is normal if you have not heard from your caregiver or the medical facility. It is important for you to follow up on all of your test results.  SEEK IMMEDIATE MEDICAL ATTENTION IF:  You have more than a spotting of blood in your stool.   Your belly is swollen (abdominal distention).   You are nauseated or vomiting.   You have a temperature over 101.  You have abdominal pain or discomfort that is severe or gets worse throughout the day. EGD Discharge instructions Please read the instructions outlined below and refer to this sheet in the next few weeks. These discharge instructions provide you with general information on caring for yourself after you leave the hospital. Your doctor may also give you specific instructions. While your treatment has been planned according to the most current medical practices available, unavoidable complications occasionally occur. If you have any problems or questions after discharge, please call your doctor. ACTIVITY You may resume your regular activity but move at a slower pace for the next 24 hours.  Take frequent rest periods for the next 24 hours.  Walking will help expel (get rid of) the air and reduce the bloated feeling in your abdomen.  No driving for 24 hours (because of the anesthesia (medicine) used during the test).  You may shower.  Do not sign any important legal documents or operate any machinery for 24  hours (because of the anesthesia used during the test).  NUTRITION Drink plenty of fluids.  You may resume your normal diet.  Begin with a light meal and progress to your normal diet.  Avoid alcoholic beverages for 24 hours or as instructed by your caregiver.  MEDICATIONS You may resume your normal medications unless your caregiver tells you otherwise.  WHAT YOU CAN EXPECT TODAY You may experience abdominal discomfort such as a feeling of fullness or gas pains.   FOLLOW-UP Your doctor will discuss the results of your test with you.  SEEK IMMEDIATE MEDICAL ATTENTION IF ANY OF THE FOLLOWING OCCUR: Excessive nausea (feeling sick to your stomach) and/or vomiting.  Severe abdominal pain and distention (swelling).  Trouble swallowing.  Temperature over 101 F (37.8 C).  Rectal bleeding or vomiting of blood.   Peptic ulcer disease, hemorrhoids and constipation information provided.  Avoid all nonsteroidals  Protonix 40 mg twice daily. Begin Linzess 145 daily for constipation  Further recommendations to follow pending review of pathology report  Office visit with [place] in one month.  Peptic Ulcer A peptic ulcer is a sore in the lining of your esophagus (esophageal ulcer), stomach (gastric ulcer), or in the first part of your small intestine (duodenal ulcer). The ulcer causes erosion into the deeper tissue. CAUSES  Normally, the lining of the stomach and the small intestine protects itself from the acid that digests food. The protective lining can be damaged by:  An infection caused by a bacterium called Helicobacter pylori (H. pylori).  Regular use of nonsteroidal anti-inflammatory drugs (NSAIDs), such as ibuprofen or aspirin.  Smoking tobacco. Other risk factors include being older than 71, drinking alcohol excessively, and having a family history of ulcer disease.  SYMPTOMS   Burning pain or gnawing in the area between the chest and the belly button.  Heartburn.  Nausea and vomiting.  Bloating. The pain can be worse on an empty stomach and at night. If the ulcer results in bleeding, it can cause:  Black, tarry stools.  Vomiting of bright red blood.  Vomiting of coffee-ground-looking materials. DIAGNOSIS  A diagnosis is usually made based upon your history and an exam. Other tests and procedures may be performed to find the cause of the ulcer. Finding a cause will help determine the best treatment. Tests and procedures may  include:  Blood tests, stool tests, or breath tests to check for the bacterium H. pylori.  An upper gastrointestinal (GI) series of the esophagus, stomach, and small intestine.  An endoscopy to examine the esophagus, stomach, and small intestine.  A biopsy. TREATMENT  Treatment may include:  Eliminating the cause of the ulcer, such as smoking, NSAIDs, or alcohol.  Medicines to reduce the amount of acid in your digestive tract.  Antibiotic medicines if the ulcer is caused by the H. pylori bacterium.  An upper endoscopy to treat a bleeding ulcer.  Surgery if the bleeding is severe or if the ulcer created a hole somewhere in the digestive system. HOME CARE INSTRUCTIONS   Avoid tobacco, alcohol, and caffeine. Smoking can increase the acid in the stomach, and continued smoking will impair the healing of ulcers.  Avoid foods and drinks that seem to cause discomfort or aggravate your ulcer.  Only take medicines as directed by your caregiver. Do not substitute over-the-counter medicines for prescription medicines without talking to your caregiver.  Keep any follow-up appointments and tests as directed. SEEK MEDICAL CARE IF:   Your do not improve within 7 days of  starting treatment.  You have ongoing indigestion or heartburn. SEEK IMMEDIATE MEDICAL CARE IF:   You have sudden, sharp, or persistent abdominal pain.  You have bloody or dark black, tarry stools.  You vomit blood or vomit that looks like coffee grounds.  You become light-headed, weak, or feel faint.  You become sweaty or clammy. MAKE SURE YOU:   Understand these instructions.  Will watch your condition.  Will get help right away if you are not doing well or get worse. Document Released: 09/08/2000 Document Revised: 01/26/2014 Document Reviewed: 04/10/2012 [facility] Patient Information 2015 [place], [place]. This information is not intended to replace advice given to you by your health care provider. Make sure you  discuss any questions you have with your health care provider.  Hemorrhoids Hemorrhoids are swollen veins around the rectum or anus. There are two types of hemorrhoids:   Internal hemorrhoids. These occur in the veins just inside the rectum. They may poke through to the outside and become irritated and painful.  External hemorrhoids. These occur in the veins outside the anus and can be felt as a painful swelling or hard lump near the anus. CAUSES  Pregnancy.   Obesity.   Constipation or diarrhea.   Straining to have a bowel movement.   Sitting for long periods on the toilet.  Heavy lifting or other activity that caused you to strain.  Anal intercourse. SYMPTOMS   Pain.   Anal itching or irritation.   Rectal bleeding.   Fecal leakage.   Anal swelling.   One or more lumps around the anus.  DIAGNOSIS  Your caregiver may be able to diagnose hemorrhoids by visual examination. Other examinations or tests that may be performed include:   Examination of the rectal area with a gloved hand (digital rectal exam).   Examination of anal canal using a small tube (scope).   A blood test if you have lost a significant amount of blood.  A test to look inside the colon (sigmoidoscopy or colonoscopy). TREATMENT Most hemorrhoids can be treated at home. However, if symptoms do not seem to be getting better or if you have a lot of rectal bleeding, your caregiver may perform a procedure to help make the hemorrhoids get smaller or remove them completely. Possible treatments include:   Placing a rubber band at the base of the hemorrhoid to cut off the circulation (rubber band ligation).   Injecting a chemical to shrink the hemorrhoid (sclerotherapy).   Using a tool to burn the hemorrhoid (infrared light therapy).   Surgically removing the hemorrhoid (hemorrhoidectomy).   Stapling the hemorrhoid to block blood flow to the tissue (hemorrhoid stapling).  HOME CARE  INSTRUCTIONS   Eat foods with fiber, such as whole grains, beans, nuts, fruits, and vegetables. Ask your doctor about taking products with added fiber in them (fibersupplements).  Increase fluid intake. Drink enough water and fluids to keep your urine clear or pale yellow.   Exercise regularly.   Go to the bathroom when you have the urge to have a bowel movement. Do not wait.   Avoid straining to have bowel movements.   Keep the anal area dry and clean. Use wet toilet paper or moist towelettes after a bowel movement.   Medicated creams and suppositories may be used or applied as directed.   Only take over-the-counter or prescription medicines as directed by your caregiver.   Take warm sitz baths for 15-20 minutes, 3-4 times a day to ease pain and discomfort.  Place ice packs on the hemorrhoids if they are tender and swollen. Using ice packs between sitz baths may be helpful.   Put ice in a plastic bag.   Place a towel between your skin and the bag.   Leave the ice on for 15-20 minutes, 3-4 times a day.   Do not use a donut-shaped pillow or sit on the toilet for long periods. This increases blood pooling and pain.  SEEK MEDICAL CARE IF:  You have increasing pain and swelling that is not controlled by treatment or medicine.  You have uncontrolled bleeding.  You have difficulty or you are unable to have a bowel movement.  You have pain or inflammation outside the area of the hemorrhoids. MAKE SURE YOU:  Understand these instructions.  Will watch your condition.  Will get help right away if you are not doing well or get worse. Document Released: 09/08/2000 Document Revised: 08/28/2012 Document Reviewed: 07/16/2012 [facility] Patient Information 2015 [place], [place]. This information is not intended to replace advice given to you by your health care provider. Make sure you discuss any questions you have with your health care provider. Constipation Constipation  is when a person has fewer than three bowel movements a week, has difficulty having a bowel movement, or has stools that are dry, hard, or larger than normal. As people grow older, constipation is more common. If you try to fix constipation with medicines that make you have a bowel movement (laxatives), the problem may get worse. Long-term laxative use may cause the muscles of the colon to become weak. A low-fiber diet, not taking in enough fluids, and taking certain medicines may make constipation worse.  CAUSES   Certain medicines, such as antidepressants, pain medicine, iron supplements, antacids, and water pills.   Certain diseases, such as diabetes, irritable bowel syndrome (IBS), thyroid disease, or depression.   Not drinking enough water.   Not eating enough fiber-rich foods.   Stress or travel.   Lack of physical activity or exercise.   Ignoring the urge to have a bowel movement.   Using laxatives too much.  SIGNS AND SYMPTOMS   Having fewer than three bowel movements a week.   Straining to have a bowel movement.   Having stools that are hard, dry, or larger than normal.   Feeling full or bloated.   Pain in the lower abdomen.   Not feeling relief after having a bowel movement.  DIAGNOSIS  Your health care provider will take a medical history and perform a physical exam. Further testing may be done for severe constipation. Some tests may include:  A barium enema X-ray to examine your rectum, colon, and, sometimes, your small intestine.   A sigmoidoscopy to examine your lower colon.   A colonoscopy to examine your entire colon. TREATMENT  Treatment will depend on the severity of your constipation and what is causing it. Some dietary treatments include drinking more fluids and eating more fiber-rich foods. Lifestyle treatments may include regular exercise. If these diet and lifestyle recommendations do not help, your health care provider may recommend  taking over-the-counter laxative medicines to help you have bowel movements. Prescription medicines may be prescribed if over-the-counter medicines do not work.  HOME CARE INSTRUCTIONS   Eat foods that have a lot of fiber, such as fruits, vegetables, whole grains, and beans.  Limit foods high in fat and processed sugars, such as french fries, hamburgers, cookies, candies, and soda.   A fiber supplement may be added to your  diet if you cannot get enough fiber from foods.   Drink enough fluids to keep your urine clear or pale yellow.   Exercise regularly or as directed by your health care provider.   Go to the restroom when you have the urge to go. Do not hold it.   Only take over-the-counter or prescription medicines as directed by your health care provider. Do not take other medicines for constipation without talking to your health care provider first.  [place] IF:   You have bright red blood in your stool.   Your constipation lasts for more than 4 days or gets worse.   You have abdominal or rectal pain.   You have thin, pencil-like stools.   You have unexplained weight loss. MAKE SURE YOU:   Understand these instructions.  Will watch your condition.  Will get help right away if you are not doing well or get worse. Document Released: 06/09/2004 Document Revised: 09/16/2013 Document Reviewed: 06/23/2013 [facility] Patient Information 2015 [place], [place]. This information is not intended to replace advice given to you by your health care provider. Make sure you discuss any questions you have with your health care provider.

## 2014-06-22 NOTE — Op Note (Signed)
[facility] [address] [place], [address]   ENDOSCOPY PROCEDURE REPORT  PATIENT: Kathleen, Erickson  MR#: [id number] BIRTHDATE: [birth year] , 68  yrs. old GENDER: female ENDOSCOPIST: R.  Garfield Cornea, MD FACP [facility] REFERRED BY:  Soyla Dryer, PA-C PROCEDURE DATE:  06/22/2014 PROCEDURE:  EGD w/ biopsy INDICATIONS:  dyspepsia. MEDICATIONS: Versed 3 mg IV and Demerol 75 mg IV in divided doses. Xylocaine gel orally.  Zofran 4 mg IV. ASA CLASS:      Class II  CONSENT: The risks, benefits, limitations, alternatives and imponderables have been discussed.  The potential for biopsy, esophogeal dilation, etc. have also been reviewed.  Questions have been answered.  All parties agreeable.  Please see the history and physical in the medical record for more information.  DESCRIPTION OF PROCEDURE: After the risks benefits and alternatives of the procedure were thoroughly explained, informed consent was obtained.  The EG-[id number] ([id number]) endoscope was introduced through the mouth and advanced to the    , limited by Without limitations. The instrument was slowly withdrawn as the mucosa was fully examined.      ESOPHAGUS: The mucosa of the esophagus appeared normal.  STOMACH: A single non-bleeding ulcer ranging between 3-5 mm in size was found.       Patent pylorus. Normal first and second portion of the duodenum. No infiltrating process observed.  The scope was then withdrawn from the patient and the procedure completed.  The antral ulcer was biopsied.  COMPLICATIONS: There were no immediate complications.  ENDOSCOPIC IMPRESSION: 1.   The mucosa of the esophagus appeared normal 2.   Single ulcer ranging between 3-5 mm in size was found   Biopsy performed.  RECOMMENDATIONS: Await biopsy results    See colonoscopy report.  REPEAT EXAM:  eSigned:  R. Garfield Cornea, MD FACP [facility] 06/22/2014 12:19 PM    CC:  PATIENT NAME:  Kathleen, Erickson MR#: [id number]

## 2014-06-22 NOTE — H&P (View-Only) (Signed)
Primary Care Physician:  Montey Hora  Primary Gastroenterologist:  Garfield Cornea, MD   Chief Complaint  Patient presents with  . Abdominal Pain    HPI:  Kathleen Erickson is a 53 y.o. female here at the request of Soyla Dryer, PA-C with [facility] for further evaluation of abdominal pain, possible gastritis.  Patient is somewhat of a poor historian. Some recent onset heartburn. Stays sore in epigastrium. Recently placed on pantoprazole which seemed to help some of her burning. Unable to continue to afford it therefore brought Nexium OTC which she has been on 20 mg daily without significant improvement of symptoms. No N/V. Rare dysphagia. Mid-abdominal pain, uncomfortable, rolling. Symptoms worse postprandially. Denies any weight loss. Chronically constipated. Has been on Dulcolax for years. Takes 2 daily. BM were watery when first started Celexa. Now having more constipation again. Occasional brbpr with small clots. Hemorrhoids? No melena. Remote colonoscopy about 15 years ago. Does not recall the findings. Anemia off/on "all my life". Was taking NSAIDs or hip/arthritis, Aleve. No aspirin powders.    Current Outpatient Prescriptions  Medication Sig Dispense Refill  . bisacodyl (DULCOLAX) 5 MG EC tablet Take 5 mg by mouth daily as needed for moderate constipation.      . Calcium 600-200 MG-UNIT per tablet Take 1 tablet by mouth daily.      . citalopram (CELEXA) 20 MG tablet Take 20 mg by mouth daily.      Marland Kitchen esomeprazole (NEXIUM) 20 MG capsule Take 20 mg by mouth daily at 12 noon.      Marland Kitchen levothyroxine (SYNTHROID, LEVOTHROID) 25 MCG tablet Take 25 mcg by mouth daily before breakfast.      . Multiple Vitamins-Minerals (MULTIVITAMIN WITH MINERALS) tablet Take 1 tablet by mouth daily.      . Omega-3 Fatty Acids (FISH OIL) 1000 MG CAPS Take 1,200 mg by mouth daily.      . simvastatin (ZOCOR) 20 MG tablet Take 20 mg by mouth daily.       No current  facility-administered medications for this visit.    Allergies as of 05/29/2014 - Review Complete 05/29/2014  Allergen Reaction Noted  . Morphine and related  01/20/2014  . Penicillins  01/20/2014    Past Medical History  Diagnosis Date  . Hypothyroidism   . Anemia   . Depression   . Hyperlipidemia     Past Surgical History  Procedure Laterality Date  . Vaginal hysterectomy    . Cesarean section    . Colposcopy    . Carpel tunnel release      bilaterally    Family History  Problem Relation Age of Onset  . Colon cancer Neg Hx   . Ulcers Neg Hx   . Cancer Other     ulcer, aunt  . Lung cancer Cousin   . Throat cancer Cousin   . Uterine cancer Mother   . Heart disease Father     age 58, family questions autopsy    History   Social History  . Marital Status: Married    Spouse Name: N/A    Number of Children: 2  . Years of Education: N/A   Occupational History  . Not on file.   Social History Main Topics  . Smoking status: Current Every Day Smoker  . Smokeless tobacco: Not on file  . Alcohol Use: No  . Drug Use: No  . Sexual Activity: Not on file   Other Topics Concern  . Not  on file   Social History Narrative  . No narrative on file      ROS:  General: Negative for anorexia, weight loss, fever, chills, fatigue, weakness. Eyes: Negative for vision changes.  ENT: Negative for hoarseness, difficulty swallowing , nasal congestion. CV: Negative for chest pain, angina, palpitations, dyspnea on exertion, peripheral edema.  Respiratory: Negative for dyspnea at rest, dyspnea on exertion, cough, sputum, wheezing.  GI: See history of present illness. GU:  Negative for dysuria, hematuria, urinary incontinence, urinary frequency, nocturnal urination.  MS: Negative for  low back pain. Chronic hip pain Derm: Negative for rash or itching.  Neuro: Negative for weakness, abnormal sensation, seizure, frequent headaches, memory loss, confusion.  Psych: Negative for  anxiety, depression, suicidal ideation, hallucinations.  Endo: Negative for unusual weight change.  Heme: Negative for bruising or bleeding. Allergy: Negative for rash or hives.    Physical Examination:  BP 106/61  Pulse 62  Temp(Src) 97 F (36.1 C) (Oral)  Ht 5\' 1"  (1.549 m)  Wt 126 lb 6.4 oz (57.335 kg)  BMI 23.90 kg/m2   General: Well-nourished, well-developed in no acute distress.  Head: Normocephalic, atraumatic.   Eyes: Conjunctiva pink, no icterus. Mouth: Oropharyngeal mucosa moist and pink , no lesions erythema or exudate. Neck: Supple without thyromegaly, masses, or lymphadenopathy.  Lungs: Clear to auscultation bilaterally.  Heart: Regular rate and rhythm, no murmurs rubs or gallops.  Abdomen: Bowel sounds are normal, epigastric tenderness, nondistended, no hepatosplenomegaly or masses, no abdominal bruits or    hernia , no rebound or guarding.   Rectal: Not performed Extremities: No lower extremity edema. No clubbing or deformities.  Neuro: Alert and oriented x 4 , grossly normal neurologically.  Skin: Warm and dry, no rash or jaundice.   Psych: Alert and cooperative, normal mood and affect.  Labs: 02/28/14: Hgb 10.6  Imaging Studies: No results found.

## 2014-06-24 ENCOUNTER — Encounter: Payer: Self-pay | Admitting: Internal Medicine

## 2014-06-24 ENCOUNTER — Telehealth: Payer: Self-pay | Admitting: Internal Medicine

## 2014-06-24 ENCOUNTER — Encounter (HOSPITAL_COMMUNITY): Payer: Self-pay | Admitting: Internal Medicine

## 2014-06-24 NOTE — Telephone Encounter (Signed)
Pt said that she had called yesterday for the nurse and she has called again today. She had a procedure by RMR recently and he wrote her 2 prescriptions (Linzess and Protonix). Patient does not have insurance and said both medicines are too expensive for her (for both it would cost over $400). Can she get samples or is there anything else cheaper for her to use. Please advise and call her back at [phone]

## 2014-06-25 ENCOUNTER — Telehealth: Payer: Self-pay

## 2014-06-25 NOTE — Telephone Encounter (Signed)
I would give her Dexilant samples, one daily. #20 Patient assistant forms.

## 2014-06-25 NOTE — Telephone Encounter (Signed)
RMR gave rx for protonix bid and the patient said it was going to cost her $300.00. We have savings cards but they are only for $70.00 off and that will not help the pt. There is no patient assistance for protonix that I can find. Can we change her to something else that she can try to get assistance for?

## 2014-06-25 NOTE — Telephone Encounter (Signed)
Letter from: Daneil Dolin Reason for Letter: Results Review  Send letter to patient.  Send copy of letter with path to referring provider and PCP.   Patient needs office visit with extender in 3 months to set up a repeat EGD

## 2014-06-25 NOTE — Telephone Encounter (Signed)
Gave pt #3 boxes of Linzess 171mcg. She filled out pt assistance while she was here, they are on LSL cart, needs rx and signature.  Checking to see if I can find her some assistance with protonix.

## 2014-06-25 NOTE — Telephone Encounter (Signed)
Patient assistance papers and dexilant samples are at the front desk.  Kathleen Erickson, I called pt and told her that we would be working on getting her some type of medication that has assistance. Will you call and tell her that it is ready for her to pick up. Thanks.

## 2014-06-25 NOTE — Telephone Encounter (Signed)
Pt would like some samples of Linzess and any coupon/vouchers we may have. She is interested in the patient assistance and I told her that we would mail that out to her. She will be coming by today to pick up samples.

## 2014-06-26 NOTE — Telephone Encounter (Signed)
Patient already scheduled Oct, 2015 for post procedure f/u.  Nic'd again for 3 months.

## 2014-07-01 ENCOUNTER — Other Ambulatory Visit: Payer: Self-pay | Admitting: Gastroenterology

## 2014-07-01 MED ORDER — DEXLANSOPRAZOLE 60 MG PO CPDR: 60.0000 mg | DELAYED_RELEASE_CAPSULE | Freq: Every day | ORAL | Status: DC

## 2014-07-21 ENCOUNTER — Encounter: Payer: Self-pay | Admitting: Gastroenterology

## 2014-07-21 ENCOUNTER — Ambulatory Visit (INDEPENDENT_AMBULATORY_CARE_PROVIDER_SITE_OTHER): Payer: Self-pay | Admitting: Gastroenterology

## 2014-07-21 ENCOUNTER — Encounter (INDEPENDENT_AMBULATORY_CARE_PROVIDER_SITE_OTHER): Payer: Self-pay

## 2014-07-21 VITALS — BP 108/56 | HR 66 | Temp 98.6°F | Ht 61.0 in | Wt 128.2 lb

## 2014-07-21 DIAGNOSIS — K5900 Constipation, unspecified: Secondary | ICD-10-CM

## 2014-07-21 DIAGNOSIS — K279 Peptic ulcer, site unspecified, unspecified as acute or chronic, without hemorrhage or perforation: Secondary | ICD-10-CM

## 2014-07-21 MED ORDER — LINACLOTIDE 290 MCG PO CAPS: 290.0000 ug | ORAL_CAPSULE | Freq: Every day | ORAL | Status: DC

## 2014-07-21 NOTE — Progress Notes (Signed)
Referring Provider: Montey Hora Primary Care Physician:  Montey Hora Primary GI: Dr. Gala Romney   Chief Complaint  Patient presents with  . Follow-up    constipation    HPI:   Presents in follow-up after TCS/EGD. EGD with single gastric ulcer, negative H.pylori. Colonoscopy with external and internal hemorrhoids otherwise normal. Couldn't afford Linzess, waiting on patient assistance. Linzess 145 mcg with some improvement but had BM every few days. Taking a fiber laxative without much improvement. Taking Protonix BID. Drank a sip of coke the other day and felt awful.   Past Medical History  Diagnosis Date  . Hypothyroidism   . Anemia   . Depression   . Hyperlipidemia     Past Surgical History  Procedure Laterality Date  . Vaginal hysterectomy    . Cesarean section    . Colposcopy    . Carpel tunnel release      bilaterally  . Colonoscopy N/A 06/22/2014    Dr. Rourk:External and internal hemorrhoids-grade 4; otherwise normal rectum total colon and terminal ileum.  . Esophagogastroduodenoscopy N/A 06/22/2014    Dr. Gala Romney:The mucosa of the esophagus appeared normal  Single gastric ulcer ranging between 3-5 mm in size was found   Biopsy with negative H.pylori.     Current Outpatient Prescriptions  Medication Sig Dispense Refill  . bisacodyl (DULCOLAX) 5 MG EC tablet Take 5 mg by mouth daily as needed for moderate constipation.      . Calcium 600-200 MG-UNIT per tablet Take 1 tablet by mouth daily.      . citalopram (CELEXA) 20 MG tablet Take 20 mg by mouth daily.      Marland Kitchen levothyroxine (SYNTHROID, LEVOTHROID) 25 MCG tablet Take 25 mcg by mouth daily before breakfast.      . Multiple Vitamins-Minerals (MULTIVITAMIN WITH MINERALS) tablet Take 1 tablet by mouth daily.      . Omega-3 Fatty Acids (FISH OIL) 1000 MG CAPS Take 1,200 mg by mouth daily.      . pantoprazole (PROTONIX) 40 MG tablet Take 40 mg by mouth 2 (two) times daily before a meal.      .  simvastatin (ZOCOR) 20 MG tablet Take 20 mg by mouth daily.      . Linaclotide (LINZESS) 290 MCG CAPS capsule Take 1 capsule (290 mcg total) by mouth daily. Take 30 minutes prior to breakfast.  30 capsule  5   No current facility-administered medications for this visit.    Allergies as of 07/21/2014 - Review Complete 07/21/2014  Allergen Reaction Noted  . Morphine and related  01/20/2014  . Penicillins  01/20/2014    Family History  Problem Relation Age of Onset  . Colon cancer Neg Hx   . Ulcers Neg Hx   . Cancer Other     ulcer, aunt  . Lung cancer Cousin   . Throat cancer Cousin   . Uterine cancer Mother   . Heart disease Father     age 60, family questions autopsy    History   Social History  . Marital Status: Married    Spouse Name: N/A    Number of Children: 2  . Years of Education: N/A   Social History Main Topics  . Smoking status: Current Every Day Smoker  . Smokeless tobacco: None     Comment: Smokes 5 cigarettes daily  . Alcohol Use: No  . Drug Use: No  . Sexual Activity: None   Other Topics Concern  .  None   Social History Narrative  . None    Review of Systems: As mentioned in HPI.   Physical Exam: BP 108/56  Pulse 66  Temp(Src) 98.6 F (37 C) (Oral)  Ht 5\' 1"  (1.549 m)  Wt 128 lb 3.2 oz (58.151 kg)  BMI 24.24 kg/m2 General:   Alert and oriented. No distress noted. Pleasant and cooperative.  Head:  Normocephalic and atraumatic. Eyes:  Conjuctiva clear without scleral icterus. Mouth:  Oral mucosa pink and moist.  Abdomen:  +BS, soft, mild TTP LUQ, epigastric  and non-distended. No rebound or guarding. No HSM or masses noted. Msk:  Symmetrical without gross deformities. Normal posture. Extremities:  Without edema. Neurologic:  Alert and  oriented x4;  grossly normal neurologically. Skin:  Intact without significant lesions or rashes. Psych:  Alert and cooperative. Normal mood and affect.

## 2014-07-21 NOTE — Patient Instructions (Signed)
Start taking Linzess 1 capsule each morning, 30 minutes before breakfast. I have increased the dosage and provided a voucher for a 30 day supply.  Continue Protonix twice a day for the next 2 months.   We would like to see you back late December to set up a repeat upper endoscopy to make sure the ulcer is healed.

## 2014-07-23 ENCOUNTER — Encounter: Payer: Self-pay | Admitting: Gastroenterology

## 2014-07-23 DIAGNOSIS — K279 Peptic ulcer, site unspecified, unspecified as acute or chronic, without hemorrhage or perforation: Secondary | ICD-10-CM | POA: Insufficient documentation

## 2014-07-23 MED ORDER — LINACLOTIDE 290 MCG PO CAPS: 290.0000 ug | ORAL_CAPSULE | Freq: Every day | ORAL | Status: DC

## 2014-07-23 NOTE — Addendum Note (Signed)
Addended by: Orvil Feil on: 07/23/2014 02:12 PM   Modules accepted: Orders

## 2014-07-23 NOTE — Progress Notes (Signed)
cc'ed to pcp °

## 2014-07-23 NOTE — Assessment & Plan Note (Signed)
Moderate improvement with Linzess 145 mcg daily. Will trial Linzess 290 mcg daily. Patient assistance forms to be completed due to lack of insurance.

## 2014-07-23 NOTE — Assessment & Plan Note (Signed)
On EGD Sept 2015. Continue PPI BID. Return in late December to set up surveillance.

## 2014-08-04 ENCOUNTER — Ambulatory Visit (HOSPITAL_COMMUNITY)
Admission: RE | Admit: 2014-08-04 | Discharge: 2014-08-04 | Disposition: A | Discharge status: Home / Self Care | Payer: Self-pay | Source: Ambulatory Visit | Attending: Physician Assistant | Admitting: Physician Assistant

## 2014-08-04 ENCOUNTER — Other Ambulatory Visit (HOSPITAL_COMMUNITY): Payer: Self-pay | Admitting: Physician Assistant

## 2014-08-04 DIAGNOSIS — F17200 Nicotine dependence, unspecified, uncomplicated: Secondary | ICD-10-CM

## 2014-08-04 DIAGNOSIS — R071 Chest pain on breathing: Secondary | ICD-10-CM | POA: Insufficient documentation

## 2014-08-04 DIAGNOSIS — F17210 Nicotine dependence, cigarettes, uncomplicated: Secondary | ICD-10-CM | POA: Insufficient documentation

## 2014-08-05 ENCOUNTER — Encounter: Payer: Self-pay | Admitting: Internal Medicine

## 2014-10-15 ENCOUNTER — Other Ambulatory Visit (HOSPITAL_COMMUNITY): Payer: Self-pay | Admitting: Physician Assistant

## 2014-10-15 ENCOUNTER — Ambulatory Visit (HOSPITAL_COMMUNITY)
Admission: RE | Admit: 2014-10-15 | Discharge: 2014-10-15 | Disposition: A | Discharge status: Home / Self Care | Payer: Self-pay | Source: Ambulatory Visit | Attending: Physician Assistant | Admitting: Physician Assistant

## 2014-10-15 DIAGNOSIS — M5134 Other intervertebral disc degeneration, thoracic region: Secondary | ICD-10-CM | POA: Insufficient documentation

## 2014-10-15 DIAGNOSIS — M545 Low back pain: Secondary | ICD-10-CM

## 2014-10-15 DIAGNOSIS — M546 Pain in thoracic spine: Secondary | ICD-10-CM | POA: Insufficient documentation

## 2014-10-15 DIAGNOSIS — M47896 Other spondylosis, lumbar region: Secondary | ICD-10-CM | POA: Insufficient documentation

## 2014-10-20 ENCOUNTER — Other Ambulatory Visit: Payer: Self-pay

## 2014-10-20 ENCOUNTER — Ambulatory Visit (INDEPENDENT_AMBULATORY_CARE_PROVIDER_SITE_OTHER): Payer: Self-pay | Admitting: Gastroenterology

## 2014-10-20 ENCOUNTER — Encounter: Payer: Self-pay | Admitting: Gastroenterology

## 2014-10-20 VITALS — BP 111/60 | HR 75 | Temp 97.6°F | Ht 65.0 in | Wt 133.8 lb

## 2014-10-20 DIAGNOSIS — K279 Peptic ulcer, site unspecified, unspecified as acute or chronic, without hemorrhage or perforation: Secondary | ICD-10-CM

## 2014-10-20 DIAGNOSIS — K5900 Constipation, unspecified: Secondary | ICD-10-CM

## 2014-10-20 MED ORDER — PANTOPRAZOLE SODIUM 40 MG PO TBEC: 40.0000 mg | DELAYED_RELEASE_TABLET | Freq: Two times a day (BID) | ORAL | Status: DC

## 2014-10-20 NOTE — Patient Instructions (Signed)
I sent Protonix to the [facility]. You will need to call them to set up the delivery from [facility] to [facility].   We have scheduled you for a repeat upper endoscopy with Dr. Gala Romney in the near future to ensure ulcer healing.

## 2014-10-20 NOTE — Progress Notes (Signed)
Referring Provider: Montey Hora Primary Care Physician:  Montey Hora Primary GI: Dr. Gala Romney   Chief Complaint  Patient presents with  . EGD    HPI:   Kathleen Erickson is a 54 year old female presenting today in routine follow-up to set up EGD for surveillance. EGD Sept 2015 with single gastric ulcer, negative H.pylori. Colonoscopy with external and internal hemorrhoids otherwise normal. Couldn't afford Linzess, waiting on patient assistance. Linzess 145 mcg with some improvement but had BM every few days. Taking a fiber laxative without much improvement. Taking Protonix BID. Drank a sip of coke the other day and felt awful.   Past Medical History  Diagnosis Date  . Hypothyroidism   . Anemia   . Depression   . Hyperlipidemia     Past Surgical History  Procedure Laterality Date  . Vaginal hysterectomy    . Cesarean section    . Colposcopy    . Carpel tunnel release      bilaterally  . Colonoscopy N/A 06/22/2014    Dr. Rourk:External and internal hemorrhoids-grade 4; otherwise normal rectum total colon and terminal ileum.  . Esophagogastroduodenoscopy N/A 06/22/2014    Dr. Gala Romney:The mucosa of the esophagus appeared normal  Single gastric ulcer ranging between 3-5 mm in size was found   Biopsy with negative H.pylori.     Current Outpatient Prescriptions  Medication Sig Dispense Refill  . bisacodyl (DULCOLAX) 5 MG EC tablet Take 5 mg by mouth daily as needed for moderate constipation.    . Calcium 600-200 MG-UNIT per tablet Take 1 tablet by mouth daily.    . citalopram (CELEXA) 20 MG tablet Take 20 mg by mouth daily.    Marland Kitchen levothyroxine (SYNTHROID, LEVOTHROID) 25 MCG tablet Take 25 mcg by mouth daily before breakfast.    . Linaclotide (LINZESS) 290 MCG CAPS capsule Take 1 capsule (290 mcg total) by mouth daily. Take 30 minutes prior to breakfast. 30 capsule 5  . Multiple Vitamins-Minerals (MULTIVITAMIN WITH MINERALS) tablet Take 1 tablet by mouth  daily.    . Omega-3 Fatty Acids (FISH OIL) 1000 MG CAPS Take 1,200 mg by mouth daily.    . pantoprazole (PROTONIX) 40 MG tablet Take 40 mg by mouth 2 (two) times daily before a meal.    . simvastatin (ZOCOR) 20 MG tablet Take 20 mg by mouth daily.     No current facility-administered medications for this visit.    Allergies as of 10/20/2014 - Review Complete 10/20/2014  Allergen Reaction Noted  . Morphine and related  01/20/2014  . Penicillins  01/20/2014    Family History  Problem Relation Age of Onset  . Colon cancer Neg Hx   . Ulcers Neg Hx   . Cancer Other     ulcer, aunt  . Lung cancer Cousin   . Throat cancer Cousin   . Uterine cancer Mother   . Heart disease Father     age 21, family questions autopsy    History   Social History  . Marital Status: Married    Spouse Name: N/A    Number of Children: 2  . Years of Education: N/A   Social History Main Topics  . Smoking status: Current Every Day Smoker  . Smokeless tobacco: None     Comment: Smokes 5 cigarettes daily  . Alcohol Use: No  . Drug Use: No  . Sexual Activity: None   Other Topics Concern  . None   Social History  Narrative    Review of Systems: As mentioned in HPI.   Physical Exam: BP 111/60 mmHg  Pulse 75  Temp(Src) 97.6 F (36.4 C) (Oral)  Ht 5\' 5"  (1.651 m)  Wt 133 lb 12.8 oz (60.691 kg)  BMI 22.27 kg/m2 General:   Alert and oriented. No distress noted. Pleasant and cooperative.  Head:  Normocephalic and atraumatic. Eyes:  Conjuctiva clear without scleral icterus. Mouth:  Oral mucosa pink and moist.  Abdomen:  +BS, soft, mild TTP LUQ, epigastric  and non-distended. No rebound or guarding. No HSM or masses noted. Msk:  Symmetrical without gross deformities. Normal posture. Extremities:  Without edema. Neurologic:  Alert and  oriented x4;  grossly normal neurologically. Skin:  Intact without significant lesions or rashes. Psych:  Alert and cooperative. Normal mood and affect.

## 2014-10-20 NOTE — Progress Notes (Signed)
Referring Provider: Montey Hora Primary Care Physician:  Montey Hora  Chief Complaint  Patient presents with  . EGD    HPI:   Kathleen Erickson is a 54 y.o. female presenting today with a history of   Ran out of Linzess. Back on dulcolax.     Past Medical History  Diagnosis Date  . Hypothyroidism   . Anemia   . Depression   . Hyperlipidemia     Past Surgical History  Procedure Laterality Date  . Vaginal hysterectomy    . Cesarean section    . Colposcopy    . Carpel tunnel release      bilaterally  . Colonoscopy N/A 06/22/2014    Dr. Rourk:External and internal hemorrhoids-grade 4; otherwise normal rectum total colon and terminal ileum.  . Esophagogastroduodenoscopy N/A 06/22/2014    Dr. Gala Romney:The mucosa of the esophagus appeared normal  Single gastric ulcer ranging between 3-5 mm in size was found   Biopsy with negative H.pylori.     Current Outpatient Prescriptions  Medication Sig Dispense Refill  . bisacodyl (DULCOLAX) 5 MG EC tablet Take 5 mg by mouth daily as needed for moderate constipation.    . Calcium 600-200 MG-UNIT per tablet Take 1 tablet by mouth daily.    . citalopram (CELEXA) 20 MG tablet Take 20 mg by mouth daily.    Marland Kitchen levothyroxine (SYNTHROID, LEVOTHROID) 25 MCG tablet Take 25 mcg by mouth daily before breakfast.    . Multiple Vitamins-Minerals (MULTIVITAMIN WITH MINERALS) tablet Take 1 tablet by mouth daily.    . Omega-3 Fatty Acids (FISH OIL) 1000 MG CAPS Take 1,200 mg by mouth daily.    . simvastatin (ZOCOR) 20 MG tablet Take 20 mg by mouth daily.     No current facility-administered medications for this visit.    Allergies as of 10/20/2014 - Review Complete 10/20/2014  Allergen Reaction Noted  . Morphine and related  01/20/2014  . Penicillins  01/20/2014    Family History  Problem Relation Age of Onset  . Colon cancer Neg Hx   . Ulcers Neg Hx   . Cancer Other     ulcer, aunt  . Lung cancer Cousin   . Throat  cancer Cousin   . Uterine cancer Mother   . Heart disease Father     age 9, family questions autopsy    History   Social History  . Marital Status: Married    Spouse Name: N/A    Number of Children: 2  . Years of Education: N/A   Social History Main Topics  . Smoking status: Current Every Day Smoker  . Smokeless tobacco: None     Comment: Smokes 5 cigarettes daily  . Alcohol Use: No  . Drug Use: No  . Sexual Activity: None   Other Topics Concern  . None   Social History Narrative    Review of Systems: Gen: Denies fever, chills, anorexia. Denies fatigue, weakness, weight loss.  CV: Denies chest pain, palpitations, syncope, peripheral edema, and claudication. Resp: Denies dyspnea at rest, cough, wheezing, coughing up blood, and pleurisy. GI: Denies vomiting blood, jaundice, and fecal incontinence.   Denies dysphagia or odynophagia. Derm: Denies rash, itching, dry skin Psych: Denies depression, anxiety, memory loss, confusion. No homicidal or suicidal ideation.  Heme: Denies bruising, bleeding, and enlarged lymph nodes.  Physical Exam: BP 111/60 mmHg  Pulse 75  Temp(Src) 97.6 F (36.4 C) (Oral)  Ht 5\' 5"  (1.651 m)  Wt  133 lb 12.8 oz (60.691 kg)  BMI 22.27 kg/m2 General:   Alert and oriented. No distress noted. Pleasant and cooperative.  Head:  Normocephalic and atraumatic. Eyes:  Conjuctiva clear without scleral icterus. Mouth:  Oral mucosa pink and moist. Good dentition. No lesions. Neck:  Supple, without mass or thyromegaly. Heart:  S1, S2 present without murmurs, rubs, or gallops. Regular rate and rhythm. Abdomen:  +BS, soft, non-tender and non-distended. No rebound or guarding. No HSM or masses noted. Msk:  Symmetrical without gross deformities. Normal posture. Pulses:  2+ DP noted bilaterally Extremities:  Without edema. Neurologic:  Alert and  oriented x4;  grossly normal neurologically. Skin:  Intact without significant lesions or rashes. Cervical Nodes:   No significant cervical adenopathy. Psych:  Alert and cooperative. Normal mood and affect.

## 2014-10-27 ENCOUNTER — Other Ambulatory Visit (HOSPITAL_COMMUNITY): Payer: Self-pay | Admitting: *Deleted

## 2014-10-27 DIAGNOSIS — R922 Inconclusive mammogram: Secondary | ICD-10-CM

## 2014-10-27 DIAGNOSIS — Z09 Encounter for follow-up examination after completed treatment for conditions other than malignant neoplasm: Secondary | ICD-10-CM

## 2014-10-27 NOTE — Assessment & Plan Note (Signed)
Needs Linzess 290 mcg daily. Patient assistance started due to lack of insurance.

## 2014-10-27 NOTE — Assessment & Plan Note (Signed)
54 year old female with PUD noted on EGD in Sept 2015, now due for surveillance to document ulcer healing. Negative H.pylori. Overall, doing well from a GI standpoint.   Proceed with upper endoscopy in the near future with Dr. Gala Romney. The risks, benefits, and alternatives have been discussed in detail with patient. They have stated understanding and desire to proceed.  Continue [facility] BID[facility]

## 2014-10-28 ENCOUNTER — Encounter (HOSPITAL_COMMUNITY)
Admission: RE | Disposition: A | Discharge status: Home / Self Care | Payer: Self-pay | Source: Ambulatory Visit | Attending: Internal Medicine

## 2014-10-28 ENCOUNTER — Ambulatory Visit (HOSPITAL_COMMUNITY)
Admission: RE | Admit: 2014-10-28 | Discharge: 2014-10-28 | Disposition: A | Discharge status: Home / Self Care | Payer: Self-pay | Source: Ambulatory Visit | Attending: Internal Medicine | Admitting: Internal Medicine

## 2014-10-28 ENCOUNTER — Encounter (HOSPITAL_COMMUNITY): Payer: Self-pay | Admitting: *Deleted

## 2014-10-28 DIAGNOSIS — Z09 Encounter for follow-up examination after completed treatment for conditions other than malignant neoplasm: Secondary | ICD-10-CM | POA: Insufficient documentation

## 2014-10-28 DIAGNOSIS — K449 Diaphragmatic hernia without obstruction or gangrene: Secondary | ICD-10-CM | POA: Insufficient documentation

## 2014-10-28 DIAGNOSIS — F17210 Nicotine dependence, cigarettes, uncomplicated: Secondary | ICD-10-CM | POA: Insufficient documentation

## 2014-10-28 DIAGNOSIS — Z8711 Personal history of peptic ulcer disease: Secondary | ICD-10-CM | POA: Insufficient documentation

## 2014-10-28 DIAGNOSIS — E785 Hyperlipidemia, unspecified: Secondary | ICD-10-CM | POA: Insufficient documentation

## 2014-10-28 DIAGNOSIS — E039 Hypothyroidism, unspecified: Secondary | ICD-10-CM | POA: Insufficient documentation

## 2014-10-28 DIAGNOSIS — F329 Major depressive disorder, single episode, unspecified: Secondary | ICD-10-CM | POA: Insufficient documentation

## 2014-10-28 DIAGNOSIS — K279 Peptic ulcer, site unspecified, unspecified as acute or chronic, without hemorrhage or perforation: Secondary | ICD-10-CM

## 2014-10-28 HISTORY — PX: ESOPHAGOGASTRODUODENOSCOPY: SHX5428

## 2014-10-28 SURGERY — EGD (ESOPHAGOGASTRODUODENOSCOPY)
Anesthesia: Moderate Sedation

## 2014-10-28 MED ORDER — MEPERIDINE HCL 100 MG/ML IJ SOLN
INTRAMUSCULAR | Status: DC | PRN
  Administered 2014-10-28: 25 mg via INTRAVENOUS
  Administered 2014-10-28: 50 mg via INTRAVENOUS

## 2014-10-28 MED ORDER — SODIUM CHLORIDE 0.9 % IV SOLN
INTRAVENOUS | Status: DC
  Administered 2014-10-28: 13:00:00 via INTRAVENOUS

## 2014-10-28 MED ORDER — MEPERIDINE HCL 100 MG/ML IJ SOLN
INTRAMUSCULAR | Status: AC
  Filled 2014-10-28: qty 2

## 2014-10-28 MED ORDER — ONDANSETRON HCL 4 MG/2ML IJ SOLN
INTRAMUSCULAR | Status: AC
  Filled 2014-10-28: qty 2

## 2014-10-28 MED ORDER — MIDAZOLAM HCL 5 MG/5ML IJ SOLN
INTRAMUSCULAR | Status: DC | PRN
  Administered 2014-10-28 (×2): 2 mg via INTRAVENOUS

## 2014-10-28 MED ORDER — MIDAZOLAM HCL 5 MG/5ML IJ SOLN
INTRAMUSCULAR | Status: AC
  Filled 2014-10-28: qty 10

## 2014-10-28 MED ORDER — LIDOCAINE VISCOUS 2 % MT SOLN
OROMUCOSAL | Status: DC | PRN
  Administered 2014-10-28: 3 mL via OROMUCOSAL

## 2014-10-28 MED ORDER — ONDANSETRON HCL 4 MG/2ML IJ SOLN
INTRAMUSCULAR | Status: DC | PRN
  Administered 2014-10-28: 4 mg via INTRAVENOUS

## 2014-10-28 MED ORDER — STERILE WATER FOR IRRIGATION IR SOLN
Status: DC | PRN
  Administered 2014-10-28: 14:00:00

## 2014-10-28 MED ORDER — LIDOCAINE VISCOUS 2 % MT SOLN
OROMUCOSAL | Status: AC
  Filled 2014-10-28: qty 15

## 2014-10-28 NOTE — Op Note (Signed)
[facility] [address] [place], [address]   ENDOSCOPY PROCEDURE REPORT  PATIENT: [name], Kathleen Erickson  MR#: [id number] BIRTHDATE: [birth year] , 55  yrs. old GENDER: female ENDOSCOPIST: R.  Garfield Cornea, MD FACP [facility] REFERRED BY:  Soyla Dryer, PA-C PROCEDURE DATE:  November 23, 2014 PROCEDURE:  EGD, diagnostic INDICATIONS:  Follow-up gastric ulcer. MEDICATIONS: Versed 4 mg IV and Demerol 75 mg IV in divided doses. Xylocaine gel orally.  Zofran 4 mg IV. ASA CLASS:      Class II  CONSENT: The risks, benefits, limitations, alternatives and imponderables have been discussed.  The potential for biopsy, esophogeal dilation, etc. have also been reviewed.  Questions have been answered.  All parties agreeable.  Please see the history and physical in the medical record for more information.  DESCRIPTION OF PROCEDURE: After the risks benefits and alternatives of the procedure were thoroughly explained, informed consent was obtained.  The EG-[id number] ([id number]) endoscope was introduced through the mouth and advanced to the second portion of the duodenum , limited by Without limitations. The instrument was slowly withdrawn as the mucosa was fully examined.    Normal-appearing esophagus (patulous EG junction) .  Stomach empty. Small hiatal hernia.  Gastric mucosa appeared normal?"previously noted gastric ulcer completely healed.  Pylorus appeared Normal first and second portion of the duodenum.  Retroflexed views revealed as previously described.     The scope was then withdrawn from the patient and the procedure completed.  COMPLICATIONS: There were no immediate complications.  ENDOSCOPIC IMPRESSION: Patulous EG junction; small hiatal hernia; otherwise normal EGD?"previously noted gastric ulcer completely healed.  RECOMMENDATIONS: Continue to avoid nonsteroidal agents. May decrease Protonix to 40 mg once daily. Office visit in 3 months to reassess constipation.  REPEAT  EXAM:  eSigned:  R. Garfield Cornea, MD Rosalita Chessman [facility] 2014-11-23 2:16 PM    CC:  CPT CODES: ICD CODES:  The ICD and CPT codes recommended by this software are interpretations from the data that the clinical staff has captured with the software.  The verification of the translation of this report to the ICD and CPT codes and modifiers is the sole responsibility of the health care institution and practicing physician where this report was generated.  [employer] will not be held responsible for the validity of the ICD and CPT codes included on this report.  AMA assumes no liability for data contained or not contained herein. CPT is a Designer, television/film set of the [employer].

## 2014-10-28 NOTE — Interval H&P Note (Signed)
History and Physical Interval Note:  05/30/8015 5:53 PM  Kathleen Erickson  has presented today for surgery, with the diagnosis of Peptic Ulcer Disease  The various methods of treatment have been discussed with the patient and family. After consideration of risks, benefits and other options for treatment, the patient has consented to  Procedure(s) with comments: ESOPHAGOGASTRODUODENOSCOPY (EGD) (N/A) - 230 as a surgical intervention .  The patient's history has been reviewed, patient examined, no change in status, stable for surgery.  I have reviewed the patient's chart and labs.  Questions were answered to the patient's satisfaction.     Rossie Scarfone  No change. EGD per plan.The risks, benefits, limitations, alternatives and imponderables have been reviewed with the patient. Potential for esophageal dilation, biopsy, etc. have also been reviewed.  Questions have been answered. All parties agreeable.

## 2014-10-28 NOTE — H&P (View-Only) (Signed)
Referring Provider: Montey Hora Primary Care Physician:  Montey Hora  Chief Complaint  Patient presents with  . EGD    HPI:   Kathleen Erickson is a 54 y.o. female presenting today with a history of   Ran out of Linzess. Back on dulcolax.     Past Medical History  Diagnosis Date  . Hypothyroidism   . Anemia   . Depression   . Hyperlipidemia     Past Surgical History  Procedure Laterality Date  . Vaginal hysterectomy    . Cesarean section    . Colposcopy    . Carpel tunnel release      bilaterally  . Colonoscopy N/A 06/22/2014    Dr. Rourk:External and internal hemorrhoids-grade 4; otherwise normal rectum total colon and terminal ileum.  . Esophagogastroduodenoscopy N/A 06/22/2014    Dr. Gala Romney:The mucosa of the esophagus appeared normal  Single gastric ulcer ranging between 3-5 mm in size was found   Biopsy with negative H.pylori.     Current Outpatient Prescriptions  Medication Sig Dispense Refill  . bisacodyl (DULCOLAX) 5 MG EC tablet Take 5 mg by mouth daily as needed for moderate constipation.    . Calcium 600-200 MG-UNIT per tablet Take 1 tablet by mouth daily.    . citalopram (CELEXA) 20 MG tablet Take 20 mg by mouth daily.    Marland Kitchen levothyroxine (SYNTHROID, LEVOTHROID) 25 MCG tablet Take 25 mcg by mouth daily before breakfast.    . Multiple Vitamins-Minerals (MULTIVITAMIN WITH MINERALS) tablet Take 1 tablet by mouth daily.    . Omega-3 Fatty Acids (FISH OIL) 1000 MG CAPS Take 1,200 mg by mouth daily.    . simvastatin (ZOCOR) 20 MG tablet Take 20 mg by mouth daily.     No current facility-administered medications for this visit.    Allergies as of 10/20/2014 - Review Complete 10/20/2014  Allergen Reaction Noted  . Morphine and related  01/20/2014  . Penicillins  01/20/2014    Family History  Problem Relation Age of Onset  . Colon cancer Neg Hx   . Ulcers Neg Hx   . Cancer Other     ulcer, aunt  . Lung cancer Cousin   . Throat  cancer Cousin   . Uterine cancer Mother   . Heart disease Father     age 29, family questions autopsy    History   Social History  . Marital Status: Married    Spouse Name: N/A    Number of Children: 2  . Years of Education: N/A   Social History Main Topics  . Smoking status: Current Every Day Smoker  . Smokeless tobacco: None     Comment: Smokes 5 cigarettes daily  . Alcohol Use: No  . Drug Use: No  . Sexual Activity: None   Other Topics Concern  . None   Social History Narrative    Review of Systems: Gen: Denies fever, chills, anorexia. Denies fatigue, weakness, weight loss.  CV: Denies chest pain, palpitations, syncope, peripheral edema, and claudication. Resp: Denies dyspnea at rest, cough, wheezing, coughing up blood, and pleurisy. GI: Denies vomiting blood, jaundice, and fecal incontinence.   Denies dysphagia or odynophagia. Derm: Denies rash, itching, dry skin Psych: Denies depression, anxiety, memory loss, confusion. No homicidal or suicidal ideation.  Heme: Denies bruising, bleeding, and enlarged lymph nodes.  Physical Exam: BP 111/60 mmHg  Pulse 75  Temp(Src) 97.6 F (36.4 C) (Oral)  Ht 5\' 5"  (1.651 m)  Wt  133 lb 12.8 oz (60.691 kg)  BMI 22.27 kg/m2 General:   Alert and oriented. No distress noted. Pleasant and cooperative.  Head:  Normocephalic and atraumatic. Eyes:  Conjuctiva clear without scleral icterus. Mouth:  Oral mucosa pink and moist. Good dentition. No lesions. Neck:  Supple, without mass or thyromegaly. Heart:  S1, S2 present without murmurs, rubs, or gallops. Regular rate and rhythm. Abdomen:  +BS, soft, non-tender and non-distended. No rebound or guarding. No HSM or masses noted. Msk:  Symmetrical without gross deformities. Normal posture. Pulses:  2+ DP noted bilaterally Extremities:  Without edema. Neurologic:  Alert and  oriented x4;  grossly normal neurologically. Skin:  Intact without significant lesions or rashes. Cervical Nodes:   No significant cervical adenopathy. Psych:  Alert and cooperative. Normal mood and affect.

## 2014-10-28 NOTE — Discharge Instructions (Addendum)
EGD Discharge instructions Please read the instructions outlined below and refer to this sheet in the next few weeks. These discharge instructions provide you with general information on caring for yourself after you leave the hospital. Your doctor may also give you specific instructions. While your treatment has been planned according to the most current medical practices available, unavoidable complications occasionally occur. If you have any problems or questions after discharge, please call your doctor. ACTIVITY  You may resume your regular activity but move at a slower pace for the next 24 hours.   Take frequent rest periods for the next 24 hours.   Walking will help expel (get rid of) the air and reduce the bloated feeling in your abdomen.   No driving for 24 hours (because of the anesthesia (medicine) used during the test).   You may shower.   Do not sign any important legal documents or operate any machinery for 24 hours (because of the anesthesia used during the test).  NUTRITION  Drink plenty of fluids.   You may resume your normal diet.   Begin with a light meal and progress to your normal diet.   Avoid alcoholic beverages for 24 hours or as instructed by your caregiver.  MEDICATIONS  You may resume your normal medications unless your caregiver tells you otherwise.  WHAT YOU CAN EXPECT TODAY  You may experience abdominal discomfort such as a feeling of fullness or gas pains.  FOLLOW-UP  Your doctor will discuss the results of your test with you.  SEEK IMMEDIATE MEDICAL ATTENTION IF ANY OF THE FOLLOWING OCCUR:  Excessive nausea (feeling sick to your stomach) and/or vomiting.   Severe abdominal pain and distention (swelling).   Trouble swallowing.   Temperature over 101 F (37.8 C).  Rectal bleeding or vomiting of blood.    May decrease Protonix to 40 mg once daily  Continue to avoid nonsteroidal agents like Advil and high-dose aspirin.  Office visit with  [place] in 3 months

## 2014-10-29 ENCOUNTER — Encounter (HOSPITAL_COMMUNITY): Payer: Self-pay | Admitting: Internal Medicine

## 2014-11-10 ENCOUNTER — Ambulatory Visit (HOSPITAL_COMMUNITY)
Admission: RE | Admit: 2014-11-10 | Discharge: 2014-11-10 | Disposition: A | Discharge status: Home / Self Care | Payer: PRIVATE HEALTH INSURANCE | Source: Ambulatory Visit | Attending: *Deleted | Admitting: *Deleted

## 2014-11-10 DIAGNOSIS — R922 Inconclusive mammogram: Secondary | ICD-10-CM

## 2014-11-10 DIAGNOSIS — Z09 Encounter for follow-up examination after completed treatment for conditions other than malignant neoplasm: Secondary | ICD-10-CM

## 2014-11-10 NOTE — Progress Notes (Signed)
cc'ed to pcp °

## 2014-11-17 ENCOUNTER — Telehealth: Payer: Self-pay | Admitting: General Practice

## 2014-11-17 NOTE — Telephone Encounter (Signed)
I spoke with Kathleen Erickson and made her aware that moving forward if we have a patient that needs assistance with their medications.  She will need to send the referral to our office and we will sign and send the Rx only for the medications we manage.  She voiced understanding and the call was disconnected.

## 2014-11-17 NOTE — Telephone Encounter (Signed)
I received a call from Shara Blazing, LPN from the [facility].  [facility] to see Isabelle Course) at the health department who is an advocate to receive assitance for her medications.   After speaking with Joelene Millin, I'm going to confirm with Garnette Gunner that we will only sign for medications we manage.  Moving forward [facility] will [facility] all [facility] and they will switch everything over to the [facility].  If there is a need for [place] to sign for the medications we manage it will come from the [facility].  Elizebeth Koller, executive director for the [facility] will be calling the [employer] concerning this issue.

## 2014-11-17 NOTE — Telephone Encounter (Signed)
Correction:  Garnette Gunner works for the [employer]

## 2014-12-22 ENCOUNTER — Encounter: Payer: Self-pay | Admitting: Gastroenterology

## 2014-12-22 ENCOUNTER — Ambulatory Visit (INDEPENDENT_AMBULATORY_CARE_PROVIDER_SITE_OTHER): Payer: Self-pay | Admitting: Gastroenterology

## 2014-12-22 VITALS — BP 108/70 | HR 79 | Temp 98.0°F | Ht 61.0 in | Wt 133.4 lb

## 2014-12-22 DIAGNOSIS — K5900 Constipation, unspecified: Secondary | ICD-10-CM

## 2014-12-22 DIAGNOSIS — Z8711 Personal history of peptic ulcer disease: Secondary | ICD-10-CM

## 2014-12-22 NOTE — Progress Notes (Signed)
Referring Provider: Soyla Dryer, PA-C Primary Care Physician:  Montey Hora Primary GI: Dr. Gala Romney   Chief Complaint  Patient presents with  . Follow-up    HPI:   Kathleen Erickson is a 54 y.o. female presenting today with a history of PUD, with recent EGD for surveillance noting healed gastric ulcer.   Protonix BID. Good control of GERD symptoms. No dysphagia. Back on dulcolax. Linzess 290 mcg worked well. Abdominal cramping/discomfort with constipation. Bad gas, cramps since off Linzess. Issues with getting Linzess through pharmacy. Wants to get back on Linzess.   Past Medical History  Diagnosis Date  . Hypothyroidism   . Anemia   . Depression   . Hyperlipidemia     Past Surgical History  Procedure Laterality Date  . Vaginal hysterectomy    . Cesarean section    . Colposcopy    . Carpel tunnel release      bilaterally  . Colonoscopy N/A 06/22/2014    Dr. Rourk:External and internal hemorrhoids-grade 4; otherwise normal rectum total colon and terminal ileum.  . Esophagogastroduodenoscopy N/A 06/22/2014    Dr. Gala Romney:The mucosa of the esophagus appeared normal  Single gastric ulcer ranging between 3-5 mm in size was found   Biopsy with negative H.pylori.   . Esophagogastroduodenoscopy N/A 10/28/2014    Dr. Gala Romney: patulous EG junction, small hiatal hernia, previous noted gastric ulcer healed    Current Outpatient Prescriptions  Medication Sig Dispense Refill  . bisacodyl (DULCOLAX) 5 MG EC tablet Take 5 mg by mouth daily as needed for moderate constipation.    . Calcium 600-200 MG-UNIT per tablet Take 1 tablet by mouth daily.    . citalopram (CELEXA) 20 MG tablet Take 20 mg by mouth daily.    Marland Kitchen levothyroxine (SYNTHROID, LEVOTHROID) 25 MCG tablet Take 25 mcg by mouth daily before breakfast.    . Multiple Vitamins-Minerals (MULTIVITAMIN WITH MINERALS) tablet Take 1 tablet by mouth daily.    . Omega-3 Fatty Acids (FISH OIL) 1000 MG CAPS Take 1,200 mg by mouth  daily.    . pantoprazole (PROTONIX) 40 MG tablet Take 1 tablet (40 mg total) by mouth 2 (two) times daily before a meal. 60 tablet 3  . simvastatin (ZOCOR) 20 MG tablet Take 20 mg by mouth daily.     No current facility-administered medications for this visit.    Allergies as of 12/22/2014 - Review Complete 12/22/2014  Allergen Reaction Noted  . Morphine and related  01/20/2014  . Penicillins  01/20/2014    Family History  Problem Relation Age of Onset  . Colon cancer Neg Hx   . Ulcers Neg Hx   . Cancer Other     ulcer, aunt  . Lung cancer Cousin   . Throat cancer Cousin   . Uterine cancer Mother   . Heart disease Father     age 50, family questions autopsy    History   Social History  . Marital Status: Married    Spouse Name: N/A  . Number of Children: 2  . Years of Education: N/A   Social History Main Topics  . Smoking status: Current Every Day Smoker  . Smokeless tobacco: Not on file     Comment: Smokes 5 cigarettes daily  . Alcohol Use: No  . Drug Use: No  . Sexual Activity: Not on file   Other Topics Concern  . None   Social History Narrative    Review of Systems: Negative unless mentioned  in HPI  Physical Exam: BP 108/70 mmHg  Pulse 79  Temp(Src) 98 F (36.7 C)  Ht 5\' 1"  (1.549 m)  Wt 133 lb 6.4 oz (60.51 kg)  BMI 25.22 kg/m2 General:   Alert and oriented. No distress noted. Pleasant and cooperative.  Head:  Normocephalic and atraumatic. Eyes:  Conjuctiva clear without scleral icterus. Mouth:  Oral mucosa pink and moist. Good dentition. No lesions. Abdomen:  +BS, soft, non-tender and non-distended. No rebound or guarding. No HSM or masses noted. Msk:  Symmetrical without gross deformities. Normal posture. Extremities:  Without edema. Neurologic:  Alert and  oriented x4;  grossly normal neurologically. Psych:  Alert and cooperative. Normal mood and affect.

## 2014-12-22 NOTE — Patient Instructions (Signed)
Continue Protonix once to twice a day.   Continue Linzess 1 capsule each morning, 30 minutes before breakfast. We have provided samples. Almyra Free has already faxed the license that was needed, but she is checking on this again.   We will see you in 1 year!

## 2014-12-31 ENCOUNTER — Encounter: Payer: Self-pay | Admitting: Gastroenterology

## 2014-12-31 NOTE — Assessment & Plan Note (Signed)
Did well on Linzess 290 mcg daily but had issues with coverage. Will provide samples and attempt to obtain this again for her. Return in 1 year or sooner if needed.

## 2014-12-31 NOTE — Assessment & Plan Note (Signed)
54 year old female with history of PUD, recent EGD now with resolution. Avoidance of NSAIDs recommended, continuing Protonix once to twice a day if needed, but once daily indefinitely at a minimum. Will have her return in 1 year or sooner if needed.

## 2015-01-01 NOTE — Progress Notes (Signed)
cc'ed to pcp °

## 2015-01-26 ENCOUNTER — Ambulatory Visit: Payer: Self-pay | Admitting: Gastroenterology

## 2015-04-06 ENCOUNTER — Other Ambulatory Visit: Payer: Self-pay

## 2015-04-07 MED ORDER — LINACLOTIDE 290 MCG PO CAPS: 290.0000 ug | ORAL_CAPSULE | Freq: Every day | ORAL | Status: DC

## 2015-04-14 ENCOUNTER — Telehealth: Payer: Self-pay | Admitting: Internal Medicine

## 2015-04-14 NOTE — Telephone Encounter (Signed)
I spoke with the pt assistance company- medication is going to be delivered tomorrow 04/15/15 but it will be delivered to the health department. Pt assistance was ordinally done thru the health department and they had [place] sign the forms so the pt could get it. I called and informed the pt. She will call them tomorrow.

## 2015-04-14 NOTE — Telephone Encounter (Signed)
Pt called the office asking if her Linzess was sent here. I told her no that she needed to call the patient assistance people and ask them where they sent it and it should be mailed to her house. She said that we have gotten her Linzess before and when she calls patient assistance they tell her to have our office call them. I told patient that I would let the nurse know what she said. Please advise.

## 2015-04-29 ENCOUNTER — Other Ambulatory Visit (HOSPITAL_COMMUNITY): Payer: Self-pay | Admitting: Physician Assistant

## 2015-04-29 DIAGNOSIS — Z8542 Personal history of malignant neoplasm of other parts of uterus: Secondary | ICD-10-CM

## 2015-05-07 ENCOUNTER — Ambulatory Visit (HOSPITAL_COMMUNITY)
Admission: RE | Admit: 2015-05-07 | Discharge: 2015-05-07 | Disposition: A | Discharge status: Home / Self Care | Payer: Self-pay | Source: Ambulatory Visit | Attending: Physician Assistant | Admitting: Physician Assistant

## 2015-05-07 DIAGNOSIS — Z8542 Personal history of malignant neoplasm of other parts of uterus: Secondary | ICD-10-CM | POA: Insufficient documentation

## 2015-05-07 DIAGNOSIS — N8320 Unspecified ovarian cysts: Secondary | ICD-10-CM | POA: Insufficient documentation

## 2015-05-07 DIAGNOSIS — Z08 Encounter for follow-up examination after completed treatment for malignant neoplasm: Secondary | ICD-10-CM | POA: Insufficient documentation

## 2015-07-01 ENCOUNTER — Ambulatory Visit: Payer: Self-pay | Admitting: Physician Assistant

## 2015-07-01 ENCOUNTER — Encounter: Payer: Self-pay | Admitting: Physician Assistant

## 2015-07-01 VITALS — BP 122/64 | HR 73 | Temp 97.7°F | Ht 60.25 in | Wt 135.2 lb

## 2015-07-01 DIAGNOSIS — E785 Hyperlipidemia, unspecified: Secondary | ICD-10-CM

## 2015-07-01 DIAGNOSIS — F17210 Nicotine dependence, cigarettes, uncomplicated: Secondary | ICD-10-CM

## 2015-07-01 DIAGNOSIS — K219 Gastro-esophageal reflux disease without esophagitis: Secondary | ICD-10-CM

## 2015-07-01 DIAGNOSIS — F419 Anxiety disorder, unspecified: Secondary | ICD-10-CM

## 2015-07-01 DIAGNOSIS — E039 Hypothyroidism, unspecified: Secondary | ICD-10-CM

## 2015-07-01 NOTE — Progress Notes (Signed)
   BP 122/64 mmHg  Pulse 73  Temp(Src) 97.7 F (36.5 C)  Ht 5' 0.25" (1.53 m)  Wt 135 lb 4 oz (61.349 kg)  BMI 26.21 kg/m2  SpO2 96%   Subjective:    Patient ID: Kathleen Erickson, female    DOB: [birth year], 54 y.o.   MRN: [id number]  HPI: Kathleen Erickson is a 54 y.o. female presenting on 07/01/2015 for Leg Pain   HPI   Pt is past due for routine f/u.  She was scheduled to come in for appt in august.  Pt states mood good on pristiq.  (anxiety)  Pt thought she was due for mammo so we reviewed her feb 2016 mammo which rec recheck 12 mo  vytorin started feb 2016 and will be due for recheck end of oct.  Will recheck h/h for anemia end of this month when check lipids/LFTs  Pt thought it was time for her to return to GI. Reviewed OV from April 2016 which rec f/u 1 year   Pt states Leg pain now started about 1 mo ago.  Hurts bilat. On post thighs and lower ant shins.  Hurts more when she is sitting.  Doesn't hurt much with walking    Relevant past medical, surgical, family and social history reviewed and updated as indicated. Interim medical history since our last visit reviewed. Allergies and medications reviewed and updated.  Review of Systems  Per HPI unless specifically indicated above     Objective:    BP 122/64 mmHg  Pulse 73  Temp(Src) 97.7 F (36.5 C)  Ht 5' 0.25" (1.53 m)  Wt 135 lb 4 oz (61.349 kg)  BMI 26.21 kg/m2  SpO2 96%  Wt Readings from Last 3 Encounters:  07/01/15 135 lb 4 oz (61.349 kg)  12/22/14 133 lb 6.4 oz (60.51 kg)  10/28/14 133 lb (60.328 kg)    Physical Exam  Constitutional: She is oriented to person, place, and time. She appears well-developed and well-nourished.  HENT:  Head: Normocephalic and atraumatic.  Neck: Neck supple.  Cardiovascular: Normal rate and regular rhythm.   Pulmonary/Chest: Effort normal and breath sounds normal.  Abdominal: Soft. Bowel sounds are normal. She exhibits no mass. There is no tenderness.   Musculoskeletal: She exhibits no edema.  Lymphadenopathy:    She has no cervical adenopathy.  Neurological: She is alert and oriented to person, place, and time.  Skin: Skin is warm and dry.  Psychiatric: She has a normal mood and affect. Her behavior is normal.  Vitals reviewed.   No results found for this or any previous visit.    Assessment & Plan:   Encounter Diagnoses  Name Primary?  Marland Kitchen Anxiety disorder, unspecified anxiety disorder type Yes  . Hyperlipidemia   . Hypothyroidism, unspecified hypothyroidism type   . Gastroesophageal reflux disease, esophagitis presence not specified   . Cigarette nicotine dependence, uncomplicated     -get labs -counseled on smoking cessation -f/u 3 mo. rto sooner prn

## 2015-09-08 ENCOUNTER — Other Ambulatory Visit: Payer: Self-pay | Admitting: Physician Assistant

## 2015-09-21 ENCOUNTER — Other Ambulatory Visit: Payer: Self-pay | Admitting: Physician Assistant

## 2015-09-21 MED ORDER — OMEPRAZOLE 20 MG PO CPDR: 20.0000 mg | DELAYED_RELEASE_CAPSULE | Freq: Every day | ORAL | Status: DC

## 2015-09-21 MED ORDER — DESVENLAFAXINE SUCCINATE ER 50 MG PO TB24: 50.0000 mg | ORAL_TABLET | Freq: Every day | ORAL | Status: DC

## 2015-09-21 MED ORDER — EZETIMIBE-SIMVASTATIN 10-10 MG PO TABS: 1.0000 | ORAL_TABLET | Freq: Every day | ORAL | Status: DC

## 2015-09-21 MED ORDER — LEVOTHYROXINE SODIUM 25 MCG PO TABS
25.0000 ug | ORAL_TABLET | Freq: Every day | ORAL | Status: DC
Start: 2015-09-21 — End: 2016-09-13

## 2015-11-03 ENCOUNTER — Encounter: Payer: Self-pay | Admitting: Physician Assistant

## 2015-11-03 ENCOUNTER — Ambulatory Visit: Payer: Self-pay | Admitting: Physician Assistant

## 2015-11-03 VITALS — BP 120/58 | HR 80 | Temp 97.7°F | Ht 61.0 in | Wt 140.2 lb

## 2015-11-03 DIAGNOSIS — E785 Hyperlipidemia, unspecified: Secondary | ICD-10-CM

## 2015-11-03 DIAGNOSIS — D649 Anemia, unspecified: Secondary | ICD-10-CM

## 2015-11-03 DIAGNOSIS — F4323 Adjustment disorder with mixed anxiety and depressed mood: Secondary | ICD-10-CM

## 2015-11-03 DIAGNOSIS — E039 Hypothyroidism, unspecified: Secondary | ICD-10-CM

## 2015-11-03 DIAGNOSIS — B354 Tinea corporis: Secondary | ICD-10-CM

## 2015-11-03 DIAGNOSIS — R1084 Generalized abdominal pain: Secondary | ICD-10-CM

## 2015-11-03 NOTE — Progress Notes (Signed)
BP 120/58 mmHg  Pulse 80  Temp(Src) 97.7 F (36.5 C)  Ht 5\' 1"  (1.549 m)  Wt 140 lb 3.2 oz (63.594 kg)  BMI 26.50 kg/m2  SpO2 98%   Subjective:    Patient ID: Kathleen Erickson, female    DOB: 05-26-1961, 55 y.o.   MRN: [id number]  HPI: Kathleen Erickson is a 55 y.o. female presenting on 11/03/2015 for Follow-up   HPI Pt states she is still doing well on pristiq.  Pt c/o always feeling bloated and sore (abdomen).  She feels full too easily- eats 1/2 sandwich and feels like she has eaten a huge meal.  Pt feels like she has gained a lot of wt.  Chart review- new pt appt on  02/14/12 she weighed 130 pounds.   Pt had pelvic US august 2016.  Pt has had hysterectomy and has been seen by GI.  Pt c/o ringworm not better with antifungal cream or terbinafine PO x 2 wk  Relevant past medical, surgical, family and social history reviewed and updated as indicated. Interim medical history since our last visit reviewed. Allergies and medications reviewed and updated.  Current outpatient prescriptions:  .  Calcium 600-200 MG-UNIT per tablet, Take 1 tablet by mouth daily., Disp: , Rfl:  .  desvenlafaxine (PRISTIQ) 50 MG 24 hr tablet, Take 1 tablet (50 mg total) by mouth daily., Disp: 90 tablet, Rfl: 1 .  ezetimibe-simvastatin (VYTORIN) 10-10 MG tablet, Take 1 tablet by mouth at bedtime., Disp: 90 tablet, Rfl: 1 .  levothyroxine (LEVOTHROID) 25 MCG tablet, Take 1 tablet (25 mcg total) by mouth daily before breakfast., Disp: 90 tablet, Rfl: 2 .  Linaclotide (LINZESS) 290 MCG CAPS capsule, Take 1 capsule (290 mcg total) by mouth daily., Disp: 30 capsule, Rfl: 11 .  Multiple Vitamins-Minerals (MULTIVITAMIN WITH MINERALS) tablet, Take 1 tablet by mouth daily., Disp: , Rfl:  .  Omega-3 Fatty Acids (FISH OIL) 1000 MG CAPS, Take 1,200 mg by mouth daily., Disp: , Rfl:  .  omeprazole (PRILOSEC) 20 MG capsule, Take 1 capsule (20 mg total) by mouth daily., Disp: 180 capsule, Rfl: 2 .  B Complex-C (SUPER B COMPLEX  PO), Take by mouth daily. Reported on 11/03/2015, Disp: , Rfl:  .  IRON PO, Take by mouth daily. Reported on 11/03/2015, Disp: , Rfl:   Review of Systems  Constitutional: Positive for diaphoresis and appetite change. Negative for fever, chills, fatigue and unexpected weight change.  HENT: Positive for dental problem and ear pain. Negative for congestion, drooling, facial swelling, hearing loss, mouth sores, sneezing, sore throat, trouble swallowing and voice change.   Eyes: Negative for pain, discharge, redness, itching and visual disturbance.  Respiratory: Negative for cough, choking, shortness of breath and wheezing.   Cardiovascular: Negative for chest pain, palpitations and leg swelling.  Gastrointestinal: Positive for constipation. Negative for vomiting, abdominal pain, diarrhea and blood in stool.  Endocrine: Positive for polydipsia. Negative for cold intolerance and heat intolerance.  Genitourinary: Negative for dysuria, hematuria and decreased urine volume.  Musculoskeletal: Positive for back pain and arthralgias. Negative for gait problem.  Skin: Negative for rash.  Allergic/Immunologic: Negative for environmental allergies.  Neurological: Negative for seizures, syncope, light-headedness and headaches.  Hematological: Negative for adenopathy.  Psychiatric/Behavioral: Positive for dysphoric mood. Negative for suicidal ideas and agitation. The patient is nervous/anxious.     Per HPI unless specifically indicated above     Objective:    BP 120/58 mmHg  Pulse 80  Temp(Src) 97.7 F (  36.5 C)  Ht 5\' 1"  (1.549 m)  Wt 140 lb 3.2 oz (63.594 kg)  BMI 26.50 kg/m2  SpO2 98%  Wt Readings from Last 3 Encounters:  11/03/15 140 lb 3.2 oz (63.594 kg)  07/01/15 135 lb 4 oz (61.349 kg)  12/22/14 133 lb 6.4 oz (60.51 kg)    Physical Exam  Constitutional: She is oriented to person, place, and time. She appears well-developed and well-nourished.  HENT:  Head: Normocephalic and atraumatic.   Right Ear: Hearing, tympanic membrane, external ear and ear canal normal.  Left Ear: Hearing, tympanic membrane, external ear and ear canal normal.  Neck: Neck supple.  Cardiovascular: Normal rate and regular rhythm.   Pulmonary/Chest: Effort normal and breath sounds normal.  Abdominal: Soft. Bowel sounds are normal. She exhibits no mass. There is no hepatosplenomegaly. There is generalized tenderness. There is no rebound and no CVA tenderness.  Musculoskeletal: She exhibits no edema.  Lymphadenopathy:    She has no cervical adenopathy.  Neurological: She is alert and oriented to person, place, and time.  Skin: Skin is warm and dry.  rashy patches c/w tinea on bilateral UE  Psychiatric: She has a normal mood and affect. Her behavior is normal.  Vitals reviewed. tinea BUE  No results found for this or any previous visit.    Assessment & Plan:   Encounter Diagnoses  Name Primary?  . Generalized abdominal pain Yes  . Hypothyroidism, unspecified hypothyroidism type   . Hyperlipidemia   . Anemia, unspecified anemia type   . Tinea corporis   . Adjustment disorder with mixed anxiety and depressed mood    -Check fasting labs tomorrow. -Discussed getting ct abd/pelvis. -Pt given [facility] discount app- pt to notify office when approved -F/u 6 wk to recheck abd pain/order ct ab -Refer to derm for rash

## 2015-11-04 LAB — COMPLETE METABOLIC PANEL WITH GFR
ALT: 16 U/L (ref 6–29)
AST: 19 U/L (ref 10–35)
Albumin: 4.2 g/dL (ref 3.6–5.1)
Alkaline Phosphatase: 69 U/L (ref 33–130)
BUN: 18 mg/dL (ref 7–25)
CHLORIDE: 106 mmol/L (ref 98–110)
CO2: 26 mmol/L (ref 20–31)
Calcium: 9 mg/dL (ref 8.6–10.4)
Creat: 0.57 mg/dL (ref 0.50–1.05)
GFR, Est African American: >89 mL/min (ref >=60)
GFR, Est Non African American: >89 mL/min (ref >=60)
GLUCOSE: 83 mg/dL (ref 65–99)
POTASSIUM: 4.3 mmol/L (ref 3.5–5.3)
SODIUM: 140 mmol/L (ref 135–146)
Total Bilirubin: 0.5 mg/dL (ref 0.2–1.2)
Total Protein: 6.6 g/dL (ref 6.1–8.1)

## 2015-11-04 LAB — CBC WITH DIFFERENTIAL/PLATELET
BASOS ABS: 0.1 10*3/uL (ref 0.0–0.1)
BASOS PCT: 2 % — AB (ref 0–1)
EOS ABS: 0.1 10*3/uL (ref 0.0–0.7)
Eosinophils Relative: 2 % (ref 0–5)
HCT: 35.4 % — ABNORMAL LOW (ref 36.0–46.0)
Hemoglobin: 10.7 g/dL — ABNORMAL LOW (ref 12.0–15.0)
LYMPHS ABS: 1.5 10*3/uL (ref 0.7–4.0)
Lymphocytes Relative: 36 % (ref 12–46)
MCH: 19.6 pg — ABNORMAL LOW (ref 26.0–34.0)
MCHC: 30.2 g/dL (ref 30.0–36.0)
MCV: 64.7 fL — AB (ref 78.0–100.0)
MPV: 10 fL (ref 8.6–12.4)
Monocytes Absolute: 0.4 10*3/uL (ref 0.1–1.0)
Monocytes Relative: 10 % (ref 3–12)
NEUTROS ABS: 2.1 10*3/uL (ref 1.7–7.7)
NEUTROS PCT: 50 % (ref 43–77)
PLATELETS: 278 10*3/uL (ref 150–400)
RBC: 5.47 MIL/uL — ABNORMAL HIGH (ref 3.87–5.11)
RDW: 16.4 % — ABNORMAL HIGH (ref 11.5–15.5)
WBC: 4.1 10*3/uL (ref 4.0–10.5)

## 2015-11-04 LAB — LIPID PANEL
CHOL/HDL RATIO: 2.9 ratio (ref <=5.0)
Cholesterol: 157 mg/dL (ref 125–200)
HDL: 54 mg/dL (ref >=46)
LDL Cholesterol: 88 mg/dL (ref <130)
Triglycerides: 76 mg/dL (ref <150)
VLDL: 15 mg/dL (ref <30)

## 2015-11-04 LAB — TSH: TSH: 4.1 m[IU]/L

## 2015-11-04 LAB — LIPASE: Lipase: 17 U/L (ref 7–60)

## 2015-11-04 LAB — AMYLASE: AMYLASE: 29 U/L (ref 0–105)

## 2015-11-07 DIAGNOSIS — F4323 Adjustment disorder with mixed anxiety and depressed mood: Secondary | ICD-10-CM | POA: Insufficient documentation

## 2015-11-11 ENCOUNTER — Encounter: Payer: Self-pay | Admitting: Internal Medicine

## 2015-12-15 ENCOUNTER — Ambulatory Visit: Payer: Self-pay | Admitting: Physician Assistant

## 2015-12-16 ENCOUNTER — Ambulatory Visit: Payer: Self-pay | Admitting: Physician Assistant

## 2015-12-21 ENCOUNTER — Encounter: Payer: Self-pay | Admitting: Physician Assistant

## 2015-12-27 ENCOUNTER — Ambulatory Visit: Payer: Self-pay | Admitting: Physician Assistant

## 2015-12-27 ENCOUNTER — Encounter: Payer: Self-pay | Admitting: Physician Assistant

## 2015-12-27 VITALS — BP 110/58 | HR 84 | Temp 97.5°F | Ht 61.0 in | Wt 138.5 lb

## 2015-12-27 DIAGNOSIS — F329 Major depressive disorder, single episode, unspecified: Secondary | ICD-10-CM

## 2015-12-27 DIAGNOSIS — F419 Anxiety disorder, unspecified: Secondary | ICD-10-CM

## 2015-12-27 DIAGNOSIS — R21 Rash and other nonspecific skin eruption: Secondary | ICD-10-CM

## 2015-12-27 DIAGNOSIS — F32A Depression, unspecified: Secondary | ICD-10-CM

## 2015-12-27 DIAGNOSIS — G8929 Other chronic pain: Secondary | ICD-10-CM

## 2015-12-27 DIAGNOSIS — D649 Anemia, unspecified: Secondary | ICD-10-CM

## 2015-12-27 DIAGNOSIS — K219 Gastro-esophageal reflux disease without esophagitis: Secondary | ICD-10-CM

## 2015-12-27 DIAGNOSIS — E785 Hyperlipidemia, unspecified: Secondary | ICD-10-CM

## 2015-12-27 DIAGNOSIS — R1084 Generalized abdominal pain: Secondary | ICD-10-CM

## 2015-12-27 DIAGNOSIS — E039 Hypothyroidism, unspecified: Secondary | ICD-10-CM

## 2015-12-27 MED ORDER — FLUOCINONIDE 0.05 % EX OINT: 1.0000 "application " | TOPICAL_OINTMENT | Freq: Two times a day (BID) | CUTANEOUS | Status: DC

## 2015-12-27 NOTE — Progress Notes (Signed)
BP 110/58 mmHg  Pulse 84  Temp(Src) 97.5 F (36.4 C)  Ht 5' 1" (1.549 m)  Wt 138 lb 8 oz (62.823 kg)  BMI 26.18 kg/m2  SpO2 97%   Subjective:    Patient ID: Kathleen Erickson, female    DOB: 07-29-1961, 55 y.o.   MRN: [id number]  HPI: Kathleen Erickson is a 55 y.o. female presenting on 12/27/2015 for Follow-up   HPI Pt turned in her [employer] discount application she says about 2 months ago.  Pt went to derm.  States was given rx for ointment but she couldn't get it b/c it was about $280.     Relevant past medical, surgical, family and social history reviewed and updated as indicated. Interim medical history since our last visit reviewed. Allergies and medications reviewed and updated.   Current outpatient prescriptions:  .  Calcium 600-200 MG-UNIT per tablet, Take 1 tablet by mouth daily., Disp: , Rfl:  .  desvenlafaxine (PRISTIQ) 50 MG 24 hr tablet, Take 1 tablet (50 mg total) by mouth daily., Disp: 90 tablet, Rfl: 1 .  ezetimibe-simvastatin (VYTORIN) 10-10 MG tablet, Take 1 tablet by mouth at bedtime., Disp: 90 tablet, Rfl: 1 .  IRON PO, Take 27 mg by mouth. One every other day, Disp: , Rfl:  .  levothyroxine (LEVOTHROID) 25 MCG tablet, Take 1 tablet (25 mcg total) by mouth daily before breakfast., Disp: 90 tablet, Rfl: 2 .  Linaclotide (LINZESS) 290 MCG CAPS capsule, Take 1 capsule (290 mcg total) by mouth daily., Disp: 30 capsule, Rfl: 11 .  Multiple Vitamins-Minerals (MULTIVITAMIN WITH MINERALS) tablet, Take 1 tablet by mouth daily., Disp: , Rfl:  .  Omega-3 Fatty Acids (FISH OIL) 1000 MG CAPS, Take 1,200 mg by mouth daily., Disp: , Rfl:  .  omeprazole (PRILOSEC) 20 MG capsule, Take 1 capsule (20 mg total) by mouth daily. (Patient taking differently: Take 20 mg by mouth 2 (two) times daily. ), Disp: 180 capsule, Rfl: 2   Review of Systems  Per HPI unless specifically indicated above     Objective:    BP 110/58 mmHg  Pulse 84  Temp(Src) 97.5 F (36.4 C)  Ht 5' 1" (1.549  m)  Wt 138 lb 8 oz (62.823 kg)  BMI 26.18 kg/m2  SpO2 97%  Wt Readings from Last 3 Encounters:  12/27/15 138 lb 8 oz (62.823 kg)  11/03/15 140 lb 3.2 oz (63.594 kg)  07/01/15 135 lb 4 oz (61.349 kg)    Physical Exam  Constitutional: She is oriented to person, place, and time. She appears well-developed and well-nourished.  HENT:  Head: Normocephalic and atraumatic.  Neck: Neck supple.  Cardiovascular: Normal rate and regular rhythm.   Pulmonary/Chest: Effort normal and breath sounds normal.  Abdominal: Soft. Bowel sounds are normal. She exhibits no distension and no mass. There is no hepatosplenomegaly. There is generalized tenderness. There is no rigidity, no rebound and no guarding.  Musculoskeletal: She exhibits no edema.  Lymphadenopathy:    She has no cervical adenopathy.  Neurological: She is alert and oriented to person, place, and time.  Skin: Skin is warm and dry.  Psychiatric: She has a normal mood and affect. Her behavior is normal.  Vitals reviewed.   Results for orders placed or performed in visit on 11/03/15  CBC w/Diff/Platelet  Result Value Ref Range   WBC 4.1 4.0 - 10.5 K/uL   RBC 5.47 (H) 3.87 - 5.11 MIL/uL   Hemoglobin 10.7 (L) 12.0 - 15.0 g/dL  HCT 35.4 (L) 36.0 - 46.0 %   MCV 64.7 (L) 78.0 - 100.0 fL   MCH 19.6 (L) 26.0 - 34.0 pg   MCHC 30.2 30.0 - 36.0 g/dL   RDW 16.4 (H) 11.5 - 15.5 %   Platelets 278 150 - 400 K/uL   MPV 10.0 8.6 - 12.4 fL   Neutrophils Relative % 50 43 - 77 %   Neutro Abs 2.1 1.7 - 7.7 K/uL   Lymphocytes Relative 36 12 - 46 %   Lymphs Abs 1.5 0.7 - 4.0 K/uL   Monocytes Relative 10 3 - 12 %   Monocytes Absolute 0.4 0.1 - 1.0 K/uL   Eosinophils Relative 2 0 - 5 %   Eosinophils Absolute 0.1 0.0 - 0.7 K/uL   Basophils Relative 2 (H) 0 - 1 %   Basophils Absolute 0.1 0.0 - 0.1 K/uL   Smear Review Criteria for review not met   Amylase  Result Value Ref Range   Amylase 29 0 - 105 U/L  Lipase  Result Value Ref Range   Lipase 17 7  - 60 U/L  COMPLETE METABOLIC PANEL WITH GFR  Result Value Ref Range   Sodium 140 135 - 146 mmol/L   Potassium 4.3 3.5 - 5.3 mmol/L   Chloride 106 98 - 110 mmol/L   CO2 26 20 - 31 mmol/L   Glucose, Bld 83 65 - 99 mg/dL   BUN 18 7 - 25 mg/dL   Creat 0.57 0.50 - 1.05 mg/dL   Total Bilirubin 0.5 0.2 - 1.2 mg/dL   Alkaline Phosphatase 69 33 - 130 U/L   AST 19 10 - 35 U/L   ALT 16 6 - 29 U/L   Total Protein 6.6 6.1 - 8.1 g/dL   Albumin 4.2 3.6 - 5.1 g/dL   Calcium 9.0 8.6 - 10.4 mg/dL   GFR, Est African American >89 >=60 mL/min   GFR, Est Non African American >89 >=60 mL/min  Lipid Profile  Result Value Ref Range   Cholesterol 157 125 - 200 mg/dL   Triglycerides 76 <150 mg/dL   HDL 54 >=46 mg/dL   Total CHOL/HDL Ratio 2.9 <=5.0 Ratio   VLDL 15 <30 mg/dL   LDL Cholesterol 88 <130 mg/dL  TSH  Result Value Ref Range   TSH 4.10 mIU/L      Assessment & Plan:   Encounter Diagnoses  Name Primary?  . Rash and nonspecific skin eruption Yes  . Generalized abdominal pain   . Anemia, unspecified anemia type   . Hypothyroidism, unspecified hypothyroidism type   . Hyperlipidemia   . Gastroesophageal reflux disease, esophagitis presence not specified   . Chronic pain   . Anxiety   . Depression     -reviewed labs with pt   -We will check on [facility] discount application and order ct abd/pelvis -gave pt rx for steroid ointment in same class as that given by derm but considerably less expensive.  With coupon will cost her about $22 -f/u 1 month to review ct/review abd pain  

## 2015-12-28 DIAGNOSIS — F329 Major depressive disorder, single episode, unspecified: Secondary | ICD-10-CM | POA: Insufficient documentation

## 2015-12-28 DIAGNOSIS — G8929 Other chronic pain: Secondary | ICD-10-CM | POA: Insufficient documentation

## 2015-12-28 DIAGNOSIS — F419 Anxiety disorder, unspecified: Secondary | ICD-10-CM | POA: Insufficient documentation

## 2015-12-28 DIAGNOSIS — F32A Depression, unspecified: Secondary | ICD-10-CM | POA: Insufficient documentation

## 2016-01-07 ENCOUNTER — Ambulatory Visit (HOSPITAL_COMMUNITY)
Admission: RE | Admit: 2016-01-07 | Discharge: 2016-01-07 | Disposition: A | Discharge status: Home / Self Care | Payer: Self-pay | Source: Ambulatory Visit | Attending: Physician Assistant | Admitting: Physician Assistant

## 2016-01-07 DIAGNOSIS — R1084 Generalized abdominal pain: Secondary | ICD-10-CM | POA: Insufficient documentation

## 2016-01-07 MED ORDER — IOPAMIDOL (ISOVUE-300) INJECTION 61%
100.0000 mL | Freq: Once | INTRAVENOUS | Status: AC | PRN
  Administered 2016-01-07: 100 mL via INTRAVENOUS

## 2016-01-12 ENCOUNTER — Other Ambulatory Visit (HOSPITAL_COMMUNITY): Payer: Self-pay

## 2016-01-24 ENCOUNTER — Ambulatory Visit: Payer: Self-pay | Admitting: Physician Assistant

## 2016-01-24 ENCOUNTER — Encounter: Payer: Self-pay | Admitting: Physician Assistant

## 2016-01-24 VITALS — BP 100/60 | HR 76 | Temp 97.7°F | Ht 61.0 in | Wt 139.8 lb

## 2016-01-24 DIAGNOSIS — F17210 Nicotine dependence, cigarettes, uncomplicated: Secondary | ICD-10-CM

## 2016-01-24 DIAGNOSIS — R1084 Generalized abdominal pain: Secondary | ICD-10-CM

## 2016-01-24 DIAGNOSIS — E039 Hypothyroidism, unspecified: Secondary | ICD-10-CM

## 2016-01-24 DIAGNOSIS — D649 Anemia, unspecified: Secondary | ICD-10-CM

## 2016-01-24 DIAGNOSIS — F32A Depression, unspecified: Secondary | ICD-10-CM

## 2016-01-24 DIAGNOSIS — K219 Gastro-esophageal reflux disease without esophagitis: Secondary | ICD-10-CM

## 2016-01-24 DIAGNOSIS — F419 Anxiety disorder, unspecified: Secondary | ICD-10-CM

## 2016-01-24 DIAGNOSIS — F329 Major depressive disorder, single episode, unspecified: Secondary | ICD-10-CM

## 2016-01-24 DIAGNOSIS — E785 Hyperlipidemia, unspecified: Secondary | ICD-10-CM

## 2016-01-24 MED ORDER — OMEPRAZOLE 20 MG PO CPDR: 20.0000 mg | DELAYED_RELEASE_CAPSULE | Freq: Two times a day (BID) | ORAL | Status: DC

## 2016-01-24 NOTE — Progress Notes (Signed)
BP 100/60 mmHg  Pulse 76  Temp(Src) 97.7 F (36.5 C)  Ht 5\' 1"  (1.549 m)  Wt 139 lb 12.8 oz (63.413 kg)  BMI 26.43 kg/m2  SpO2 96%   Subjective:    Patient ID: Kathleen Erickson, female    DOB: [birth year], 55 y.o.   MRN: [id number]  HPI: Kathleen Erickson is a 55 y.o. female presenting on 01/24/2016 for Abdominal Pain   HPI   Pt states she has been out of her linzess since Friday.  Reviewed results of CT scan.  Pt states her abdominal pain is about the same as it always is.   Pt states pristiq works well for her anxiety and depression  Relevant past medical, surgical, family and social history reviewed and updated as indicated. Interim medical history since our last visit reviewed. Allergies and medications reviewed and updated.   Current outpatient prescriptions:  .  CALCIUM-VITAMIN D PO, Take 1 capsule by mouth daily., Disp: , Rfl:  .  desvenlafaxine (PRISTIQ) 50 MG 24 hr tablet, Take 1 tablet (50 mg total) by mouth daily., Disp: 90 tablet, Rfl: 1 .  ezetimibe-simvastatin (VYTORIN) 10-10 MG tablet, Take 1 tablet by mouth at bedtime., Disp: 90 tablet, Rfl: 1 .  fluocinonide ointment (LIDEX) [id number] %, Apply 1 application topically 2 (two) times daily., Disp: 15 g, Rfl: 0 .  IRON PO, Take 27 mg by mouth. One every other day, Disp: , Rfl:  .  levothyroxine (LEVOTHROID) 25 MCG tablet, Take 1 tablet (25 mcg total) by mouth daily before breakfast., Disp: 90 tablet, Rfl: 2 .  Multiple Vitamins-Minerals (MULTIVITAMIN WITH MINERALS) tablet, Take 1 tablet by mouth daily., Disp: , Rfl:  .  Omega-3 Fatty Acids (FISH OIL) 1000 MG CAPS, Take 1,200 mg by mouth daily., Disp: , Rfl:  .  Linaclotide (LINZESS) 290 MCG CAPS capsule, Take 1 capsule (290 mcg total) by mouth daily. (Patient not taking: Reported on 01/24/2016), Disp: 30 capsule, Rfl: 11 .  omeprazole (PRILOSEC) 20 MG capsule, Take 1 capsule (20 mg total) by mouth daily. (Patient not taking: Reported on 01/24/2016), Disp: 180 capsule, Rfl:  2   Review of Systems  Constitutional: Positive for fatigue. Negative for fever, chills, appetite change and unexpected weight change.  HENT: Positive for sneezing. Negative for congestion, dental problem, drooling, facial swelling, hearing loss, mouth sores, sore throat, trouble swallowing and voice change.   Eyes: Positive for pain. Negative for discharge, redness, itching and visual disturbance.  Respiratory: Negative for cough, choking, shortness of breath and wheezing.   Cardiovascular: Negative for chest pain, palpitations and leg swelling.  Gastrointestinal: Positive for constipation. Negative for vomiting, abdominal pain, diarrhea and blood in stool.  Endocrine: Negative for cold intolerance, heat intolerance and polydipsia.  Genitourinary: Negative for dysuria, hematuria and decreased urine volume.  Musculoskeletal: Positive for back pain and arthralgias. Negative for gait problem.  Skin: Negative for rash.  Allergic/Immunologic: Negative for environmental allergies.  Neurological: Negative for seizures, syncope, light-headedness and headaches.  Hematological: Negative for adenopathy.  Psychiatric/Behavioral: Positive for dysphoric mood and agitation. Negative for suicidal ideas. The patient is nervous/anxious.     Per HPI unless specifically indicated above     Objective:    BP 100/60 mmHg  Pulse 76  Temp(Src) 97.7 F (36.5 C)  Ht 5\' 1"  (1.549 m)  Wt 139 lb 12.8 oz (63.413 kg)  BMI 26.43 kg/m2  SpO2 96%  Wt Readings from Last 3 Encounters:  01/24/16 139 lb 12.8 oz (63.413 kg)  12/27/15 138 lb 8 oz (62.823 kg)  11/03/15 140 lb 3.2 oz (63.594 kg)    Physical Exam  Constitutional: She is oriented to person, place, and time. She appears well-developed and well-nourished.  HENT:  Head: Normocephalic and atraumatic.  Neck: Neck supple.  Cardiovascular: Normal rate and regular rhythm.   Pulmonary/Chest: Effort normal and breath sounds normal.  Abdominal: Soft. Bowel  sounds are normal. She exhibits no mass. There is no hepatosplenomegaly. There is generalized tenderness (mild). There is no rigidity, no rebound and no guarding.  Musculoskeletal: She exhibits no edema.  Lymphadenopathy:    She has no cervical adenopathy.  Neurological: She is alert and oriented to person, place, and time.  Skin: Skin is warm and dry.  Psychiatric: She has a normal mood and affect. Her behavior is normal.  Vitals reviewed.        Assessment & Plan:   Encounter Diagnoses  Name Primary?  Marland Kitchen Anemia, unspecified anemia type Yes  . Generalized abdominal pain   . Anxiety   . Depression   . Hyperlipidemia   . Hypothyroidism, unspecified hypothyroidism type   . Gastroesophageal reflux disease, esophagitis presence not specified   . Cigarette nicotine dependence without complication    -Check h/h today -pt toContact [employer] about [employer] [facility] -discussed with pt that she needs to F/u with GI. When she gets approved for discount, she needs to call their office for appointment -F/u here in 3 months.  RTO sooner prn

## 2016-01-25 LAB — HEMATOCRIT: HEMATOCRIT: 33.4 % — AB (ref 35.0–45.0)

## 2016-01-25 LAB — HEMOGLOBIN: Hemoglobin: 10.4 g/dL — ABNORMAL LOW (ref 11.7–15.5)

## 2016-02-14 ENCOUNTER — Telehealth: Payer: Self-pay

## 2016-02-14 NOTE — Telephone Encounter (Signed)
pts medication (linzess 248mcg) arrived from pt assistance. I have called the pt, NA- LMOM asking her to come by the office to pick it up. I have left the medication at the front desk with a paper for her to sign that she received it.

## 2016-03-09 ENCOUNTER — Encounter: Payer: Self-pay | Admitting: Physician Assistant

## 2016-04-20 ENCOUNTER — Other Ambulatory Visit: Payer: Self-pay | Admitting: Student

## 2016-04-20 DIAGNOSIS — D649 Anemia, unspecified: Secondary | ICD-10-CM

## 2016-04-20 DIAGNOSIS — F419 Anxiety disorder, unspecified: Secondary | ICD-10-CM

## 2016-04-20 DIAGNOSIS — E039 Hypothyroidism, unspecified: Secondary | ICD-10-CM

## 2016-04-20 DIAGNOSIS — E785 Hyperlipidemia, unspecified: Secondary | ICD-10-CM

## 2016-04-21 LAB — LIPID PANEL
CHOL/HDL RATIO: 2.5 ratio (ref <=5.0)
Cholesterol: 158 mg/dL (ref 125–200)
HDL: 63 mg/dL (ref >=46)
LDL Cholesterol: 84 mg/dL (ref <130)
Triglycerides: 56 mg/dL (ref <150)
VLDL: 11 mg/dL (ref <30)

## 2016-04-21 LAB — COMPLETE METABOLIC PANEL WITH GFR
ALBUMIN: 4.2 g/dL (ref 3.6–5.1)
ALT: 15 U/L (ref 6–29)
AST: 20 U/L (ref 10–35)
Alkaline Phosphatase: 75 U/L (ref 33–130)
BILIRUBIN TOTAL: 0.5 mg/dL (ref 0.2–1.2)
BUN: 18 mg/dL (ref 7–25)
CALCIUM: 9.1 mg/dL (ref 8.6–10.4)
CO2: 27 mmol/L (ref 20–31)
CREATININE: 0.62 mg/dL (ref 0.50–1.05)
Chloride: 107 mmol/L (ref 98–110)
GFR, Est Non African American: >89 mL/min (ref >=60)
Glucose, Bld: 94 mg/dL (ref 65–99)
Potassium: 4.1 mmol/L (ref 3.5–5.3)
SODIUM: 140 mmol/L (ref 135–146)
Total Protein: 6.7 g/dL (ref 6.1–8.1)

## 2016-04-21 LAB — TSH: TSH: 4.12 m[IU]/L

## 2016-04-21 LAB — HEMATOCRIT: HCT: 34.4 % — ABNORMAL LOW (ref 35.0–45.0)

## 2016-04-21 LAB — HEMOGLOBIN: Hemoglobin: 10.6 g/dL — ABNORMAL LOW (ref 11.7–15.5)

## 2016-04-25 ENCOUNTER — Ambulatory Visit: Payer: Self-pay | Admitting: Physician Assistant

## 2016-04-25 ENCOUNTER — Encounter: Payer: Self-pay | Admitting: Physician Assistant

## 2016-04-25 VITALS — BP 120/60 | HR 79 | Temp 97.3°F | Ht 61.0 in | Wt 139.2 lb

## 2016-04-25 DIAGNOSIS — E785 Hyperlipidemia, unspecified: Secondary | ICD-10-CM

## 2016-04-25 DIAGNOSIS — F329 Major depressive disorder, single episode, unspecified: Secondary | ICD-10-CM

## 2016-04-25 DIAGNOSIS — F32A Depression, unspecified: Secondary | ICD-10-CM

## 2016-04-25 DIAGNOSIS — E039 Hypothyroidism, unspecified: Secondary | ICD-10-CM

## 2016-04-25 DIAGNOSIS — D649 Anemia, unspecified: Secondary | ICD-10-CM

## 2016-04-25 DIAGNOSIS — K219 Gastro-esophageal reflux disease without esophagitis: Secondary | ICD-10-CM

## 2016-04-25 DIAGNOSIS — F17210 Nicotine dependence, cigarettes, uncomplicated: Secondary | ICD-10-CM

## 2016-04-25 NOTE — Progress Notes (Signed)
BP 120/60 (BP Location: Left Arm, Patient Position: Sitting, Cuff Size: Normal)   Pulse 79   Temp 97.3 F (36.3 C)   Ht 5\' 1"  (1.549 m)   Wt 139 lb 4 oz (63.2 kg)   SpO2 97%   BMI 26.31 kg/m    Subjective:    Patient ID: Kathleen Erickson, female    DOB: 03/22/1961, 55 y.o.   MRN: [id number]  HPI: Kathleen Erickson is a 55 y.o. female presenting on 04/25/2016 for No chief complaint on file.   HPI  Pt has GI appointment this Friday  Pt is still smoking  Pt says she is doing well  Relevant past medical, surgical, family and social history reviewed and updated as indicated. Interim medical history since our last visit reviewed. Allergies and medications reviewed and updated.   Current Outpatient Prescriptions:  .  CALCIUM-VITAMIN D PO, Take 1 capsule by mouth daily., Disp: , Rfl:  .  desvenlafaxine (PRISTIQ) 50 MG 24 hr tablet, Take 1 tablet (50 mg total) by mouth daily., Disp: 90 tablet, Rfl: 1 .  ezetimibe-simvastatin (VYTORIN) 10-10 MG tablet, Take 1 tablet by mouth at bedtime., Disp: 90 tablet, Rfl: 1 .  IRON PO, Take 27 mg by mouth. One every other day, Disp: , Rfl:  .  levothyroxine (LEVOTHROID) 25 MCG tablet, Take 1 tablet (25 mcg total) by mouth daily before breakfast., Disp: 90 tablet, Rfl: 2 .  Linaclotide (LINZESS) 290 MCG CAPS capsule, Take 1 capsule (290 mcg total) by mouth daily., Disp: 30 capsule, Rfl: 11 .  Multiple Vitamins-Minerals (MULTIVITAMIN WITH MINERALS) tablet, Take 1 tablet by mouth daily., Disp: , Rfl:  .  Omega-3 Fatty Acids (FISH OIL) 1000 MG CAPS, Take 1,200 mg by mouth daily., Disp: , Rfl:  .  omeprazole (PRILOSEC) 20 MG capsule, Take 1 capsule (20 mg total) by mouth 2 (two) times daily before a meal., Disp: 180 capsule, Rfl: 3  Review of Systems  Constitutional: Negative for appetite change, chills, diaphoresis, fatigue, fever and unexpected weight change.  HENT: Positive for sore throat. Negative for congestion, drooling, ear pain, facial swelling,  hearing loss, mouth sores, sneezing, trouble swallowing and voice change.   Eyes: Negative for discharge, redness, itching and visual disturbance.  Respiratory: Negative for cough, choking, shortness of breath and wheezing.   Cardiovascular: Negative for chest pain, palpitations and leg swelling.  Gastrointestinal: Negative for abdominal pain, blood in stool, constipation, diarrhea and vomiting.  Endocrine: Negative for cold intolerance, heat intolerance and polydipsia.  Genitourinary: Negative for decreased urine volume, dysuria and hematuria.  Musculoskeletal: Positive for arthralgias and back pain. Negative for gait problem.  Skin: Negative for rash.  Allergic/Immunologic: Negative for environmental allergies.  Neurological: Negative for seizures, syncope, light-headedness and headaches.  Hematological: Negative for adenopathy.  Psychiatric/Behavioral: Positive for dysphoric mood. Negative for agitation and suicidal ideas. The patient is not nervous/anxious.     Per HPI unless specifically indicated above     Objective:    BP 120/60 (BP Location: Left Arm, Patient Position: Sitting, Cuff Size: Normal)   Pulse 79   Temp 97.3 F (36.3 C)   Ht 5\' 1"  (1.549 m)   Wt 139 lb 4 oz (63.2 kg)   SpO2 97%   BMI 26.31 kg/m   Wt Readings from Last 3 Encounters:  04/25/16 139 lb 4 oz (63.2 kg)  01/24/16 139 lb 12.8 oz (63.4 kg)  12/27/15 138 lb 8 oz (62.8 kg)    Physical Exam  Constitutional:  She is oriented to person, place, and time. She appears well-developed and well-nourished.  HENT:  Head: Normocephalic and atraumatic.  Neck: Neck supple.  Cardiovascular: Normal rate and regular rhythm.   Pulmonary/Chest: Effort normal and breath sounds normal.  Abdominal: Soft. Bowel sounds are normal. She exhibits no mass. There is no hepatosplenomegaly. There is no tenderness.  Musculoskeletal: She exhibits no edema.  Lymphadenopathy:    She has no cervical adenopathy.  Neurological: She is  alert and oriented to person, place, and time.  Skin: Skin is warm and dry.  Psychiatric: She has a normal mood and affect. Her behavior is normal.  Vitals reviewed.   Results for orders placed or performed in visit on 04/20/16  Hemoglobin  Result Value Ref Range   Hemoglobin 10.6 (L) 11.7 - 15.5 g/dL  Hematocrit  Result Value Ref Range   HCT 34.4 (L) 35.0 - 45.0 %  TSH  Result Value Ref Range   TSH 4.12 mIU/L  Lipid Profile  Result Value Ref Range   Cholesterol 158 125 - 200 mg/dL   Triglycerides 56 <150 mg/dL   HDL 63 >=46 mg/dL   Total CHOL/HDL Ratio 2.5 <=5.0 Ratio   VLDL 11 <30 mg/dL   LDL Cholesterol 84 <130 mg/dL  COMPLETE METABOLIC PANEL WITH GFR  Result Value Ref Range   Sodium 140 135 - 146 mmol/L   Potassium 4.1 3.5 - 5.3 mmol/L   Chloride 107 98 - 110 mmol/L   CO2 27 20 - 31 mmol/L   Glucose, Bld 94 65 - 99 mg/dL   BUN 18 7 - 25 mg/dL   Creat 0.62 0.50 - 1.05 mg/dL   Total Bilirubin 0.5 0.2 - 1.2 mg/dL   Alkaline Phosphatase 75 33 - 130 U/L   AST 20 10 - 35 U/L   ALT 15 6 - 29 U/L   Total Protein 6.7 6.1 - 8.1 g/dL   Albumin 4.2 3.6 - 5.1 g/dL   Calcium 9.1 8.6 - 10.4 mg/dL   GFR, Est African American >89 >=60 mL/min   GFR, Est Non African American >89 >=60 mL/min      Assessment & Plan:   Encounter Diagnoses  Name Primary?  . Hyperlipidemia Yes  . Hypothyroidism, unspecified hypothyroidism type   . Anemia, unspecified anemia type   . Cigarette nicotine dependence without complication   . Depression   . Gastroesophageal reflux disease, esophagitis presence not specified     -Reviewed labs with pt  -continue current meds -follow up with gastroenterology as scheduled -counseled on smoking cessation -follow up here 3 months.  RTO sooner prn

## 2016-04-28 ENCOUNTER — Ambulatory Visit (INDEPENDENT_AMBULATORY_CARE_PROVIDER_SITE_OTHER): Payer: Self-pay | Admitting: Gastroenterology

## 2016-04-28 ENCOUNTER — Encounter: Payer: Self-pay | Admitting: Gastroenterology

## 2016-04-28 VITALS — BP 115/68 | HR 78 | Temp 98.2°F | Ht 60.0 in | Wt 139.4 lb

## 2016-04-28 DIAGNOSIS — K219 Gastro-esophageal reflux disease without esophagitis: Secondary | ICD-10-CM

## 2016-04-28 DIAGNOSIS — K5909 Other constipation: Secondary | ICD-10-CM

## 2016-04-28 MED ORDER — OMEPRAZOLE 20 MG PO CPDR: 20.0000 mg | DELAYED_RELEASE_CAPSULE | Freq: Two times a day (BID) | ORAL | 3 refills | Status: DC

## 2016-04-28 NOTE — Progress Notes (Signed)
CC'ED TO PCP 

## 2016-04-28 NOTE — Assessment & Plan Note (Signed)
With history of PUD resolved on last EGD. Continue Prilosec BID, as this controls symptoms the best. Refills provided.

## 2016-04-28 NOTE — Assessment & Plan Note (Addendum)
Not ideally managed with Linzess 290 mcg, but I feel this is related to how she is taking the actual medication. Change from evening dosing to each morning, 30 minutes before the first meal of the day. Trial for about a week without food during dosing regimen. If still not ideal bowel movements, trial taking with food as a more novel approach to see if better results. No concerning signs, and colonoscopy fairly recent. Return in 3 months.

## 2016-04-28 NOTE — Progress Notes (Signed)
Referring Provider: Soyla Dryer, PA-C Primary Care Physician:  Soyla Dryer, PA-C  Primary GI: Dr. Gala Romney   Chief Complaint  Patient presents with  . Follow-up    still having some problems with constipation    HPI:   Kathleen Erickson is a 55 y.o. female presenting today with a history of constipation and PUD.   Taking Linzess 290 mcg once each evening at bedtime. Still feels gassy, bloating, sometimes will have watery/clumps in morning. Doesn't feel productive.   Taking Prilosec once a day but stomach felt better when it was twice a day.   Past Medical History:  Diagnosis Date  . Anemia   . Depression   . Hyperlipidemia   . Hypothyroidism     Past Surgical History:  Procedure Laterality Date  . carpel tunnel release     bilaterally  . CESAREAN SECTION    . COLONOSCOPY N/A 06/22/2014   Dr. Rourk:External and internal hemorrhoids-grade 4; otherwise normal rectum total colon and terminal ileum.  . COLPOSCOPY    . ESOPHAGOGASTRODUODENOSCOPY N/A 06/22/2014   Dr. Gala Romney:The mucosa of the esophagus appeared normal  Single gastric ulcer ranging between 3-5 mm in size was found   Biopsy with negative H.pylori.   . ESOPHAGOGASTRODUODENOSCOPY N/A 10/28/2014   Dr. Gala Romney: patulous EG junction, small hiatal hernia, previous noted gastric ulcer healed  . VAGINAL HYSTERECTOMY      Current Outpatient Prescriptions  Medication Sig Dispense Refill  . CALCIUM-VITAMIN D PO Take 1 capsule by mouth daily.    Marland Kitchen desvenlafaxine (PRISTIQ) 50 MG 24 hr tablet Take 1 tablet (50 mg total) by mouth daily. 90 tablet 1  . ezetimibe-simvastatin (VYTORIN) 10-10 MG tablet Take 1 tablet by mouth at bedtime. 90 tablet 1  . IRON PO Take 27 mg by mouth. One every other day    . levothyroxine (LEVOTHROID) 25 MCG tablet Take 1 tablet (25 mcg total) by mouth daily before breakfast. 90 tablet 2  . Linaclotide (LINZESS) 290 MCG CAPS capsule Take 1 capsule (290 mcg total) by mouth daily. 30 capsule 11  .  Multiple Vitamins-Minerals (MULTIVITAMIN WITH MINERALS) tablet Take 1 tablet by mouth daily.    . Omega-3 Fatty Acids (FISH OIL) 1000 MG CAPS Take 1,200 mg by mouth daily.    Marland Kitchen omeprazole (PRILOSEC) 20 MG capsule Take 1 capsule (20 mg total) by mouth 2 (two) times daily before a meal. 180 capsule 3   No current facility-administered medications for this visit.     Allergies as of 04/28/2016 - Review Complete 04/28/2016  Allergen Reaction Noted  . Morphine and related  01/20/2014  . Penicillins  01/20/2014    Family History  Problem Relation Age of Onset  . Uterine cancer Mother   . Heart disease Mother   . Cancer Mother   . Heart disease Father     age 13, family questions autopsy  . Cancer Other     ulcer, aunt  . Lung cancer Cousin   . Throat cancer Cousin   . Colon cancer Neg Hx   . Ulcers Neg Hx     Social History   Social History  . Marital status: Married    Spouse name: N/A  . Number of children: 2  . Years of education: N/A   Social History Main Topics  . Smoking status: Current Every Day Smoker    Packs/day: 0.25    Years: 40.00  . Smokeless tobacco: Never Used  . Alcohol use No  .  Drug use: No  . Sexual activity: Not Asked   Other Topics Concern  . None   Social History Narrative  . None    Review of Systems: Negative unless mentioned at HPI   Physical Exam: BP 115/68   Pulse 78   Temp 98.2 F (36.8 C) (Oral)   Ht 5' (1.524 m)   Wt 139 lb 6.4 oz (63.2 kg)   BMI 27.22 kg/m  General:   Alert and oriented. No distress noted. Pleasant and cooperative.  Head:  Normocephalic and atraumatic. Abdomen:  +BS, soft, non-tender and non-distended. No rebound or guarding. No HSM or masses noted. Msk:  Symmetrical without gross deformities. Normal posture. Neurologic:  Alert and  oriented x4;  grossly normal neurologically. Psych:  Alert and cooperative. Normal mood and affect.

## 2016-04-28 NOTE — Patient Instructions (Addendum)
1. Start off by taking Linzess about 30 minutes before lunch. Do this for about a week and see how you do. If you don't have a good response, then take it WITH your lunch.  2. I have sent in a prescription for Prilosec 20 mg to take twice a day, 30 minutes before breakfast and dinner.

## 2016-06-06 IMAGING — DX DG LUMBAR SPINE COMPLETE 4+V
5 series · 5 of 5 positions shown · non-contrast
Comparison: No priors.

CLINICAL DATA: 53-year-old female with lower back pain laterally,
with radiation into the left leg for the past month.

EXAM:
LUMBAR SPINE - COMPLETE 4+ VIEW

[l-spine ap]
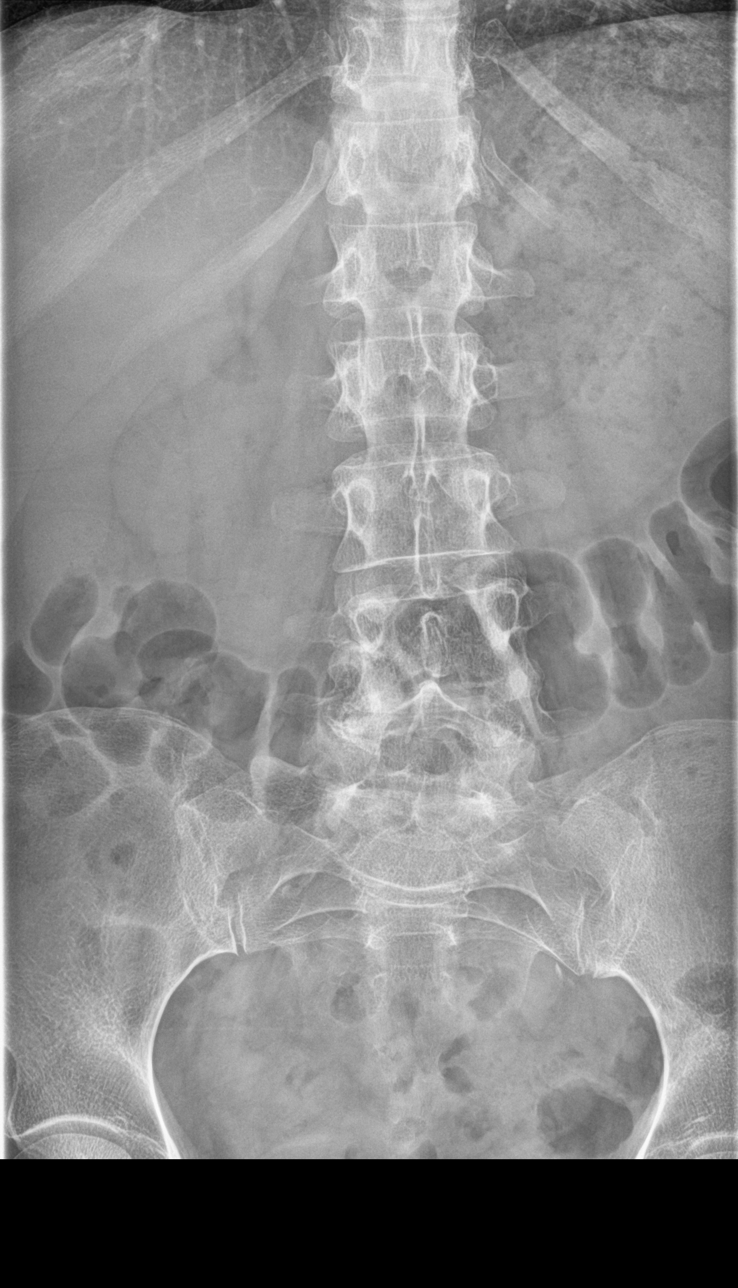

[l-spine obl (1 of 2)]
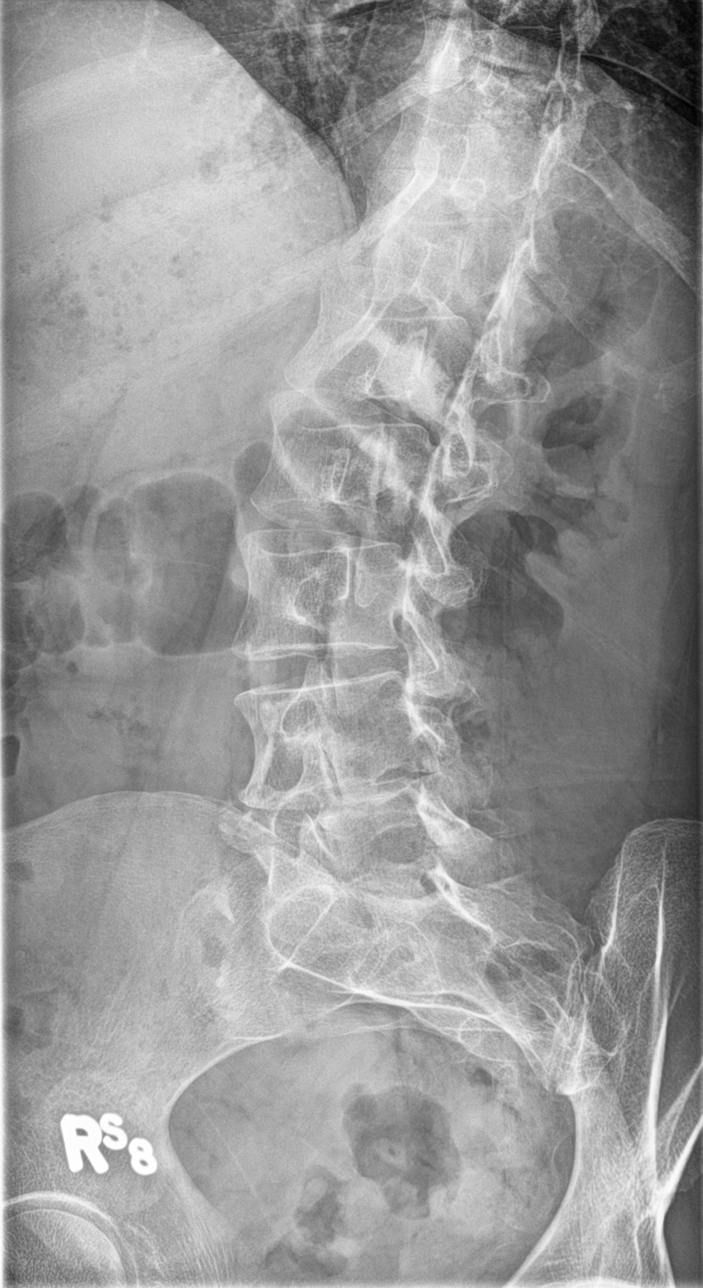

[l-spine obl (2 of 2)]
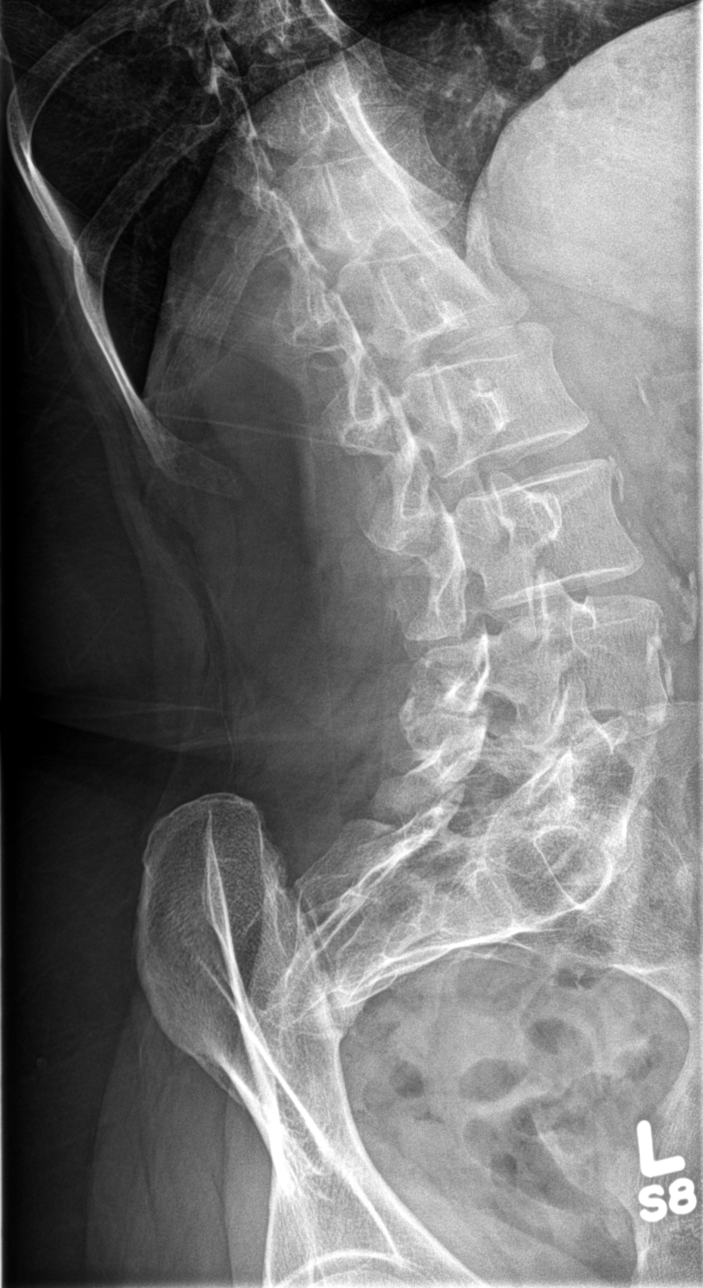

[l-spine lat]
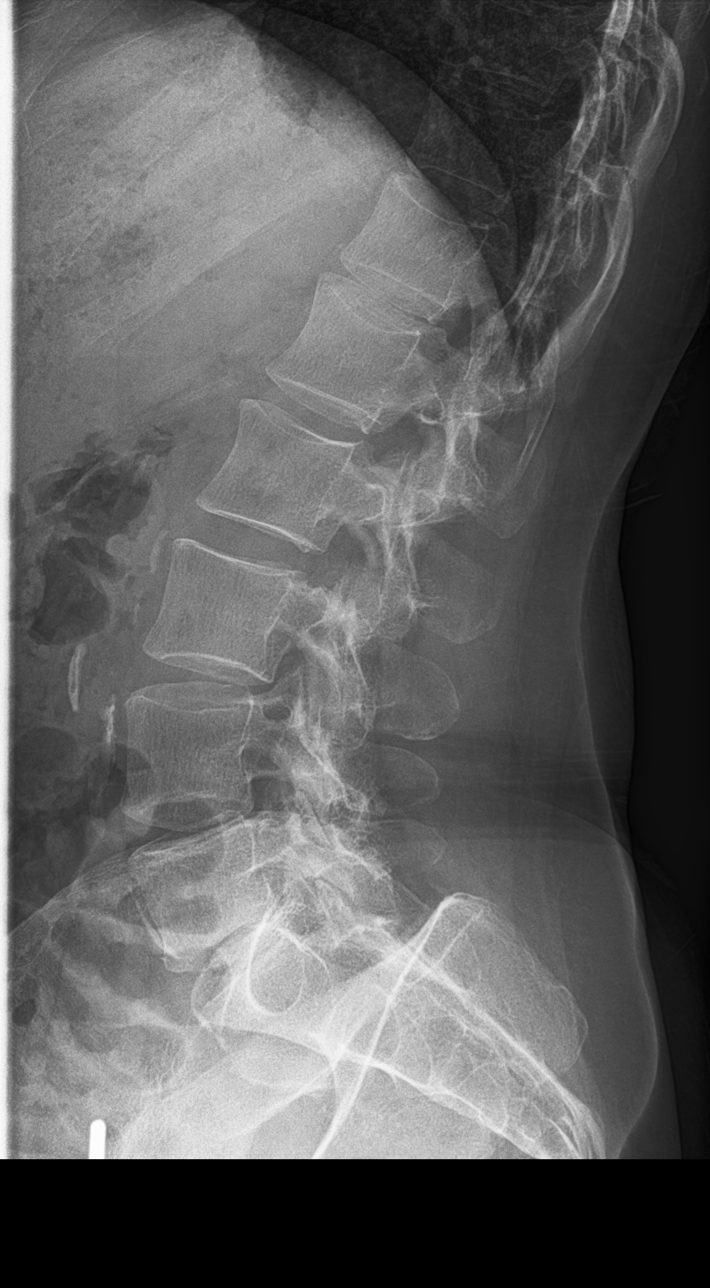

[l-spine spot]
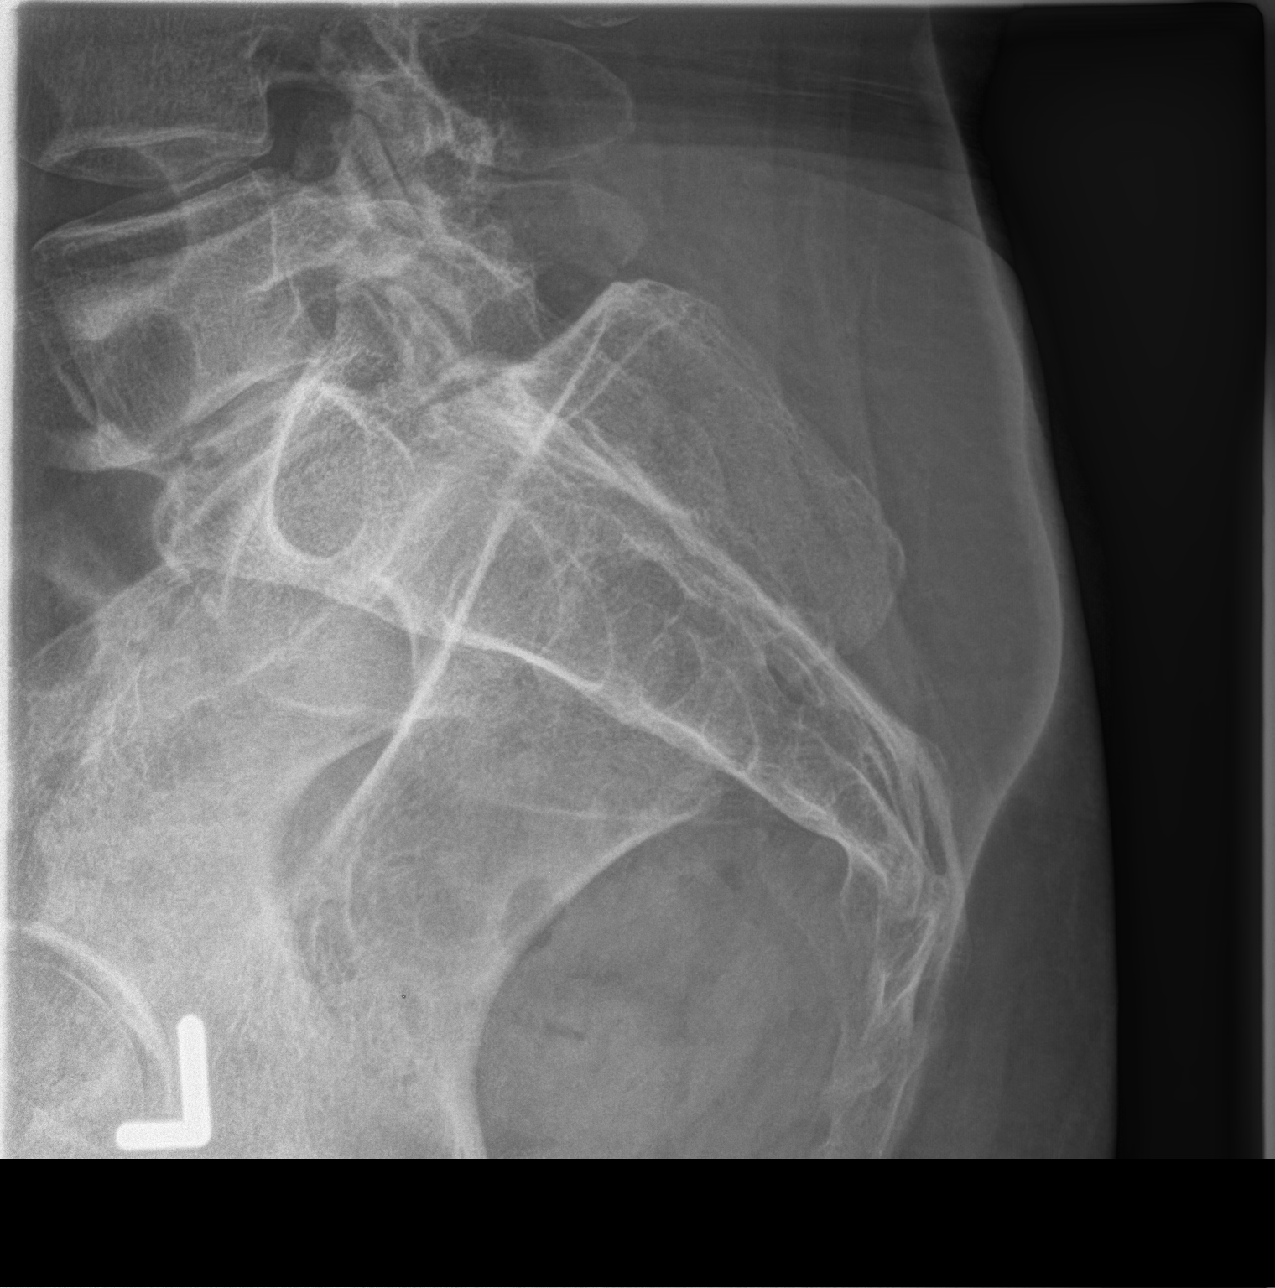

[5 of 5 positions shown; findings below may reference images not displayed]

FINDINGS: Five views of the lumbar spine demonstrate no acute displaced
fracture or compression type fracture. Very mild multilevel
degenerative disc disease, and mild multilevel facet arthropathy,
most severe at L5-S1. No defects of the pars interarticularis are
noted. Atherosclerosis in the thoracic aorta.
IMPRESSION: 1. Mild multilevel degenerative disc disease and lumbar spondylosis.
No acute findings to account for the patient's symptoms.

## 2016-06-12 ENCOUNTER — Other Ambulatory Visit: Payer: Self-pay | Admitting: Physician Assistant

## 2016-07-11 ENCOUNTER — Other Ambulatory Visit: Payer: Self-pay | Admitting: Physician Assistant

## 2016-07-11 MED ORDER — DESVENLAFAXINE SUCCINATE ER 50 MG PO TB24: 50.0000 mg | ORAL_TABLET | Freq: Every day | ORAL | 0 refills | Status: DC

## 2016-07-18 ENCOUNTER — Other Ambulatory Visit: Payer: Self-pay

## 2016-07-18 DIAGNOSIS — E785 Hyperlipidemia, unspecified: Secondary | ICD-10-CM

## 2016-07-18 DIAGNOSIS — D649 Anemia, unspecified: Secondary | ICD-10-CM

## 2016-07-19 ENCOUNTER — Encounter: Payer: Self-pay | Admitting: Physician Assistant

## 2016-07-19 ENCOUNTER — Ambulatory Visit: Payer: Self-pay | Admitting: Physician Assistant

## 2016-07-19 VITALS — BP 114/62 | HR 77 | Temp 97.3°F | Wt 140.8 lb

## 2016-07-19 DIAGNOSIS — F329 Major depressive disorder, single episode, unspecified: Secondary | ICD-10-CM

## 2016-07-19 DIAGNOSIS — J019 Acute sinusitis, unspecified: Secondary | ICD-10-CM

## 2016-07-19 DIAGNOSIS — F32A Depression, unspecified: Secondary | ICD-10-CM

## 2016-07-19 LAB — COMPLETE METABOLIC PANEL WITH GFR
ALT: 13 U/L (ref 6–29)
AST: 18 U/L (ref 10–35)
Albumin: 4.1 g/dL (ref 3.6–5.1)
Alkaline Phosphatase: 66 U/L (ref 33–130)
BUN: 17 mg/dL (ref 7–25)
CALCIUM: 9 mg/dL (ref 8.6–10.4)
CHLORIDE: 108 mmol/L (ref 98–110)
CO2: 27 mmol/L (ref 20–31)
CREATININE: 0.6 mg/dL (ref 0.50–1.05)
Glucose, Bld: 82 mg/dL (ref 65–99)
POTASSIUM: 3.9 mmol/L (ref 3.5–5.3)
Sodium: 142 mmol/L (ref 135–146)
Total Bilirubin: 0.5 mg/dL (ref 0.2–1.2)
Total Protein: 6.6 g/dL (ref 6.1–8.1)

## 2016-07-19 LAB — HEMOGLOBIN: Hemoglobin: 11.2 g/dL — ABNORMAL LOW (ref 11.7–15.5)

## 2016-07-19 LAB — LIPID PANEL
CHOL/HDL RATIO: 2.7 ratio (ref <=5.0)
CHOLESTEROL: 143 mg/dL (ref 125–200)
HDL: 53 mg/dL (ref >=46)
LDL CALC: 78 mg/dL (ref <130)
TRIGLYCERIDES: 61 mg/dL (ref <150)
VLDL: 12 mg/dL (ref <30)

## 2016-07-19 MED ORDER — DESVENLAFAXINE SUCCINATE ER 50 MG PO TB24: 50.0000 mg | ORAL_TABLET | Freq: Every day | ORAL | 3 refills | Status: DC

## 2016-07-19 MED ORDER — AZITHROMYCIN 250 MG PO TABS: ORAL_TABLET | ORAL | 0 refills | Status: AC

## 2016-07-19 MED ORDER — OMEPRAZOLE 20 MG PO CPDR: 20.0000 mg | DELAYED_RELEASE_CAPSULE | Freq: Two times a day (BID) | ORAL | 3 refills | Status: DC

## 2016-07-19 NOTE — Progress Notes (Signed)
BP 114/62 (BP Location: Right Arm, Patient Position: Sitting, Cuff Size: Normal)   Pulse 77   Temp 97.3 F (36.3 C)   Wt 140 lb 12.8 oz (63.9 kg)   SpO2 96%   BMI 27.50 kg/m    Subjective:    Patient ID: Kathleen Erickson, female    DOB: 01-06-1961, 55 y.o.   MRN: [id number]  HPI: Kathleen Erickson is a 55 y.o. female presenting on 07/19/2016 for Dizziness   HPI   Pt started feeling dizzy on Monday.  It started about 8am.  She did not awaken that way.  She says she felt good when she got up and then all of the sudden she felt dizzy.  She says when she moves her eyes it sounds like a freight train.  She c/o a lot of congestion lately and lots of coughing.  She c/o lower back pains and she gets hot and cold.   And she thinks there is an extra bone in her R ankle b/c it hurts in the morning when she gets up.  Also her L knee hurts.  The dizziness is constant- it does not come and go.  She is having some nausea at times.   She is not having any throuble walking.  She feels more tired than usual but says she has been sleeping okay.  No recent changes in depression (except that Pt out of her pristiq since Thursday.  )  She has been having HA behind her eyes.  The HA started gradually.   Pt denies spinning sensation. No LOC.   Relevant past medical, surgical, family and social history reviewed and updated as indicated. Interim medical history since our last visit reviewed. Allergies and medications reviewed and updated.  CURRENT MEDS: Calcium Iron Levothyroxine linzess MVI Fish oil Omeprazole vytorin   Review of Systems  Constitutional: Positive for chills and fatigue. Negative for appetite change, diaphoresis, fever and unexpected weight change.  HENT: Positive for congestion, ear pain, sneezing and sore throat. Negative for drooling, facial swelling, hearing loss, mouth sores, trouble swallowing and voice change.   Eyes: Positive for pain. Negative for discharge, redness, itching and  visual disturbance.  Respiratory: Positive for cough, chest tightness and wheezing. Negative for choking and shortness of breath.   Cardiovascular: Negative for chest pain, palpitations and leg swelling.  Gastrointestinal: Positive for constipation. Negative for abdominal pain, blood in stool, diarrhea and vomiting.  Endocrine: Positive for polydipsia. Negative for cold intolerance and heat intolerance.  Genitourinary: Negative for decreased urine volume, dysuria and hematuria.  Musculoskeletal: Positive for arthralgias and back pain. Negative for gait problem.  Skin: Negative for rash.  Allergic/Immunologic: Negative for environmental allergies.  Neurological: Positive for light-headedness and headaches. Negative for seizures and syncope.  Hematological: Negative for adenopathy.  Psychiatric/Behavioral: Positive for dysphoric mood. Negative for agitation and suicidal ideas. The patient is not nervous/anxious.     Per HPI unless specifically indicated above     Objective:    BP 114/62 (BP Location: Right Arm, Patient Position: Sitting, Cuff Size: Normal)   Pulse 77   Temp 97.3 F (36.3 C)   Wt 140 lb 12.8 oz (63.9 kg)   SpO2 96%   BMI 27.50 kg/m   Wt Readings from Last 3 Encounters:  07/19/16 140 lb 12.8 oz (63.9 kg)  04/28/16 139 lb 6.4 oz (63.2 kg)  04/25/16 139 lb 4 oz (63.2 kg)    Physical Exam  Constitutional: She is oriented to person, place,  and time. She appears well-developed and well-nourished.  HENT:  Head: Normocephalic and atraumatic.  Right Ear: Hearing, tympanic membrane, external ear and ear canal normal.  Left Ear: Hearing, tympanic membrane, external ear and ear canal normal.  Nose: Right sinus exhibits maxillary sinus tenderness. Left sinus exhibits maxillary sinus tenderness.  Mouth/Throat: Uvula is midline and oropharynx is clear and moist. No oropharyngeal exudate.  Neck: Neck supple.  Cardiovascular: Normal rate and regular rhythm.   Pulmonary/Chest:  Effort normal and breath sounds normal. She has no wheezes.  Abdominal: Soft. Bowel sounds are normal. She exhibits no mass. There is no hepatosplenomegaly. There is no tenderness.  Musculoskeletal: She exhibits no edema.  Lymphadenopathy:    She has no cervical adenopathy.  Neurological: She is alert and oriented to person, place, and time. She has normal strength. She displays no tremor. No cranial nerve deficit or sensory deficit. She exhibits normal muscle tone. She displays a negative Romberg sign. Coordination and gait normal.  Skin: Skin is warm and dry.  Psychiatric: She has a normal mood and affect. Her behavior is normal.  When pt was asked to stand up for Romberg test, she began crying.  She admits to feeling very stressed and thinks it's worse because she has been out of her pristiq.  Pt denies any occurrence that worsened her depression.  She denies SI, HI.  Vitals reviewed.       Assessment & Plan:    Encounter Diagnoses  Name Primary?  . Acute sinusitis, recurrence not specified, unspecified location Yes  . Depression, unspecified depression type     -pristiq was reordered from [facility] a week ago so it should arrive at pt's home any day now.  Gave pt rx and coupon to get some locally if she wants so she won't have to await the mail shipment -rx zpack.  Recommended otc mucinex, sinus medications -encouraged pt to hydrate and get some rest -follow up next week as scheduled.  Pt is to RTO for any new symptoms or if fails to resolve.    (The duration of this appointment visit was 25 minutes of face-to-face time with the patient.  Greater than 50% of this time was spent in counseling, explanation of diagnosis, planning of further management, and coordination of care.)

## 2016-07-25 ENCOUNTER — Encounter: Payer: Self-pay | Admitting: Physician Assistant

## 2016-07-25 ENCOUNTER — Ambulatory Visit: Payer: Self-pay | Admitting: Physician Assistant

## 2016-07-25 VITALS — BP 116/64 | HR 88 | Temp 97.5°F | Ht 60.0 in | Wt 139.8 lb

## 2016-07-25 DIAGNOSIS — D649 Anemia, unspecified: Secondary | ICD-10-CM

## 2016-07-25 DIAGNOSIS — F329 Major depressive disorder, single episode, unspecified: Secondary | ICD-10-CM

## 2016-07-25 DIAGNOSIS — K219 Gastro-esophageal reflux disease without esophagitis: Secondary | ICD-10-CM

## 2016-07-25 DIAGNOSIS — E039 Hypothyroidism, unspecified: Secondary | ICD-10-CM

## 2016-07-25 DIAGNOSIS — F17210 Nicotine dependence, cigarettes, uncomplicated: Secondary | ICD-10-CM

## 2016-07-25 DIAGNOSIS — F32A Depression, unspecified: Secondary | ICD-10-CM

## 2016-07-25 DIAGNOSIS — E785 Hyperlipidemia, unspecified: Secondary | ICD-10-CM

## 2016-07-25 NOTE — Progress Notes (Signed)
BP 116/64 (BP Location: Left Arm, Patient Position: Sitting, Cuff Size: Normal)   Pulse 88   Temp 97.5 F (36.4 C) (Other (Comment))   Ht 5' (1.524 m)   Wt 139 lb 12.8 oz (63.4 kg)   SpO2 96%   BMI 27.30 kg/m    Subjective:    Patient ID: Kathleen Erickson, female    DOB: 1961/06/08, 55 y.o.   MRN: [id number]  HPI: Kathleen Erickson is a 55 y.o. female presenting on 07/25/2016 for Hyperlipidemia and Hypothyroidism   HPI   Pt is feeling much better today but is not back to her 100% yet.  States mood is good.   Still smoking  Relevant past medical, surgical, family and social history reviewed and updated as indicated. Interim medical history since our last visit reviewed. Allergies and medications reviewed and updated.  Current Outpatient Prescriptions:  .  CALCIUM-VITAMIN D PO, Take 1 capsule by mouth daily., Disp: , Rfl:  .  desvenlafaxine (PRISTIQ) 50 MG 24 hr tablet, Take 1 tablet (50 mg total) by mouth daily., Disp: 7 tablet, Rfl: 3 .  IRON PO, Take 27 mg by mouth. One every other day, Disp: , Rfl:  .  levothyroxine (LEVOTHROID) 25 MCG tablet, Take 1 tablet (25 mcg total) by mouth daily before breakfast., Disp: 90 tablet, Rfl: 2 .  Linaclotide (LINZESS) 290 MCG CAPS capsule, Take 1 capsule (290 mcg total) by mouth daily., Disp: 30 capsule, Rfl: 11 .  Multiple Vitamins-Minerals (MULTIVITAMIN WITH MINERALS) tablet, Take 1 tablet by mouth daily., Disp: , Rfl:  .  Omega-3 Fatty Acids (FISH OIL) 1000 MG CAPS, Take 1,200 mg by mouth daily., Disp: , Rfl:  .  omeprazole (PRILOSEC) 20 MG capsule, Take 1 capsule (20 mg total) by mouth 2 (two) times daily before a meal., Disp: 180 capsule, Rfl: 3 .  VYTORIN 10-10 MG tablet, TAKE 1 Tablet BY MOUTH EVERY NIGHT AT BEDTIME, Disp: 90 tablet, Rfl: 2   Review of Systems  Constitutional: Negative for appetite change, chills, diaphoresis, fatigue, fever and unexpected weight change.  HENT: Negative for congestion, dental problem, drooling, ear  pain, facial swelling, hearing loss, mouth sores, sneezing, sore throat, trouble swallowing and voice change.   Eyes: Negative for pain, discharge, redness, itching and visual disturbance.  Respiratory: Negative for cough, choking, shortness of breath and wheezing.   Cardiovascular: Negative for chest pain, palpitations and leg swelling.  Gastrointestinal: Positive for constipation. Negative for abdominal pain, blood in stool, diarrhea and vomiting.  Endocrine: Negative for cold intolerance, heat intolerance and polydipsia.  Genitourinary: Negative for decreased urine volume, dysuria and hematuria.  Musculoskeletal: Positive for arthralgias and back pain. Negative for gait problem.  Skin: Negative for rash.  Allergic/Immunologic: Negative for environmental allergies.  Neurological: Positive for headaches. Negative for seizures, syncope and light-headedness.  Hematological: Negative for adenopathy.  Psychiatric/Behavioral: Negative for agitation, dysphoric mood and suicidal ideas. The patient is not nervous/anxious.     Per HPI unless specifically indicated above     Objective:    BP 116/64 (BP Location: Left Arm, Patient Position: Sitting, Cuff Size: Normal)   Pulse 88   Temp 97.5 F (36.4 C) (Other (Comment))   Ht 5' (1.524 m)   Wt 139 lb 12.8 oz (63.4 kg)   SpO2 96%   BMI 27.30 kg/m   Wt Readings from Last 3 Encounters:  07/25/16 139 lb 12.8 oz (63.4 kg)  07/19/16 140 lb 12.8 oz (63.9 kg)  04/28/16 139 lb 6.4  oz (63.2 kg)    Physical Exam  Constitutional: She is oriented to person, place, and time. She appears well-developed and well-nourished.  HENT:  Head: Normocephalic and atraumatic.  Neck: Neck supple.  Cardiovascular: Normal rate and regular rhythm.   Pulmonary/Chest: Effort normal and breath sounds normal.  Abdominal: Soft. Bowel sounds are normal. She exhibits no mass. There is no hepatosplenomegaly. There is no tenderness.  Musculoskeletal: She exhibits no edema.   Lymphadenopathy:    She has no cervical adenopathy.  Neurological: She is alert and oriented to person, place, and time.  Skin: Skin is warm and dry.  Psychiatric: She has a normal mood and affect. Her behavior is normal.  Vitals reviewed.   Results for orders placed or performed in visit on 07/18/16  Hemoglobin  Result Value Ref Range   Hemoglobin 11.2 (L) 11.7 - 15.5 g/dL  COMPLETE METABOLIC PANEL WITH GFR  Result Value Ref Range   Sodium 142 135 - 146 mmol/L   Potassium 3.9 3.5 - 5.3 mmol/L   Chloride 108 98 - 110 mmol/L   CO2 27 20 - 31 mmol/L   Glucose, Bld 82 65 - 99 mg/dL   BUN 17 7 - 25 mg/dL   Creat 0.60 0.50 - 1.05 mg/dL   Total Bilirubin 0.5 0.2 - 1.2 mg/dL   Alkaline Phosphatase 66 33 - 130 U/L   AST 18 10 - 35 U/L   ALT 13 6 - 29 U/L   Total Protein 6.6 6.1 - 8.1 g/dL   Albumin 4.1 3.6 - 5.1 g/dL   Calcium 9.0 8.6 - 10.4 mg/dL   GFR, Est African American >89 >=60 mL/min   GFR, Est Non African American >89 >=60 mL/min  Lipid Profile  Result Value Ref Range   Cholesterol 143 125 - 200 mg/dL   Triglycerides 61 <150 mg/dL   HDL 53 >=46 mg/dL   Total CHOL/HDL Ratio 2.7 <=5.0 Ratio   VLDL 12 <30 mg/dL   LDL Cholesterol 78 <130 mg/dL      Assessment & Plan:   Encounter Diagnoses  Name Primary?  . Hypothyroidism, unspecified type Yes  . Hyperlipidemia, unspecified hyperlipidemia type   . Anemia, unspecified type   . Depression, unspecified depression type   . Cigarette nicotine dependence without complication   . Gastroesophageal reflux disease, esophagitis presence not specified     -reviewed labs with pt -discussed mammogram timing and pt agrees we will schedule it at next OV  -Continue current meds -counseled on Smoking cessation -F/u 4 months.  RTO sooner prn

## 2016-07-28 ENCOUNTER — Ambulatory Visit: Payer: Self-pay | Admitting: Gastroenterology

## 2016-09-13 ENCOUNTER — Other Ambulatory Visit: Payer: Self-pay | Admitting: Physician Assistant

## 2016-09-13 MED ORDER — LEVOTHYROXINE SODIUM 25 MCG PO TABS: 25.0000 ug | ORAL_TABLET | Freq: Every day | ORAL | 4 refills | Status: DC

## 2016-09-13 MED ORDER — EZETIMIBE-SIMVASTATIN 10-10 MG PO TABS: 1.0000 | ORAL_TABLET | Freq: Every day | ORAL | 2 refills | Status: DC

## 2016-10-16 ENCOUNTER — Other Ambulatory Visit: Payer: Self-pay

## 2016-10-17 NOTE — Telephone Encounter (Signed)
Pt needs a new Rx for the Linzess 290 mcg [facility]. [facility]

## 2016-10-18 MED ORDER — LINACLOTIDE 290 MCG PO CAPS: 290.0000 ug | ORAL_CAPSULE | Freq: Every day | ORAL | 5 refills | Status: DC

## 2016-10-18 NOTE — Telephone Encounter (Signed)
LMOM for pt that the prescription has been sent to [facility].

## 2016-10-18 NOTE — Telephone Encounter (Signed)
Previous (duplicate) request already refilled. Please notify patient.

## 2016-10-21 ENCOUNTER — Encounter (HOSPITAL_COMMUNITY): Payer: Self-pay | Admitting: Emergency Medicine

## 2016-10-21 ENCOUNTER — Emergency Department (HOSPITAL_COMMUNITY)
Admission: EM | Admit: 2016-10-21 | Discharge: 2016-10-21 | Disposition: A | Discharge status: Home / Self Care | Payer: Medicaid Other | Attending: Emergency Medicine | Admitting: Emergency Medicine

## 2016-10-21 DIAGNOSIS — H6691 Otitis media, unspecified, right ear: Secondary | ICD-10-CM | POA: Diagnosis not present

## 2016-10-21 DIAGNOSIS — F17200 Nicotine dependence, unspecified, uncomplicated: Secondary | ICD-10-CM | POA: Diagnosis not present

## 2016-10-21 DIAGNOSIS — Z79899 Other long term (current) drug therapy: Secondary | ICD-10-CM | POA: Insufficient documentation

## 2016-10-21 DIAGNOSIS — R1030 Lower abdominal pain, unspecified: Secondary | ICD-10-CM | POA: Insufficient documentation

## 2016-10-21 DIAGNOSIS — R42 Dizziness and giddiness: Secondary | ICD-10-CM

## 2016-10-21 DIAGNOSIS — M549 Dorsalgia, unspecified: Secondary | ICD-10-CM | POA: Insufficient documentation

## 2016-10-21 DIAGNOSIS — R112 Nausea with vomiting, unspecified: Secondary | ICD-10-CM | POA: Insufficient documentation

## 2016-10-21 DIAGNOSIS — R197 Diarrhea, unspecified: Secondary | ICD-10-CM | POA: Diagnosis not present

## 2016-10-21 DIAGNOSIS — E039 Hypothyroidism, unspecified: Secondary | ICD-10-CM | POA: Insufficient documentation

## 2016-10-21 LAB — URINALYSIS, ROUTINE W REFLEX MICROSCOPIC
Bilirubin Urine: NEGATIVE
Glucose, UA: NEGATIVE mg/dL
Hgb urine dipstick: NEGATIVE
Ketones, ur: NEGATIVE mg/dL
Nitrite: NEGATIVE
Protein, ur: NEGATIVE mg/dL
Specific Gravity, Urine: 1.009 (ref 1.005–1.030)
pH: 6 (ref 5.0–8.0)

## 2016-10-21 MED ORDER — MECLIZINE HCL 12.5 MG PO TABS
12.5000 mg | ORAL_TABLET | Freq: Once | ORAL | Status: AC
  Administered 2016-10-21: 12.5 mg via ORAL
  Filled 2016-10-21: qty 1

## 2016-10-21 MED ORDER — MECLIZINE HCL 25 MG PO TABS: 25.0000 mg | ORAL_TABLET | Freq: Three times a day (TID) | ORAL | 0 refills | Status: DC | PRN

## 2016-10-21 MED ORDER — KETOROLAC TROMETHAMINE 30 MG/ML IJ SOLN
15.0000 mg | Freq: Once | INTRAMUSCULAR | Status: DC
  Filled 2016-10-21: qty 1

## 2016-10-21 MED ORDER — AZITHROMYCIN 250 MG PO TABS: 250.0000 mg | ORAL_TABLET | Freq: Every day | ORAL | 0 refills | Status: DC

## 2016-10-21 MED ORDER — KETOROLAC TROMETHAMINE 30 MG/ML IJ SOLN
15.0000 mg | Freq: Once | INTRAMUSCULAR | Status: AC
  Administered 2016-10-21: 15 mg via INTRAMUSCULAR

## 2016-10-21 NOTE — ED Triage Notes (Signed)
flulike sx x 3 days with body aches ha and n Had flu shot this year- [facility]

## 2016-10-21 NOTE — ED Provider Notes (Signed)
[facility] DEPT Provider Note   CSN: [id number] Arrival date & time: 10/21/16  1122  By signing my name below, I, Higinio Plan, attest that this documentation has been prepared under the direction and in the presence of Virgel Manifold, MD . Electronically Signed: Higinio Plan, Scribe. 10/21/2016. 12:23 PM.  History   Chief Complaint Chief Complaint  Patient presents with  . Influenza    x 3 days   The history is provided by the patient. No language interpreter was used.   HPI Comments: Kathleen Erickson is a 56 y.o. female who presents to the Emergency Department complaining of gradually worsening, chills and lower abdominal pain that began ~3 days ago. Pt describes her abdominal pain as constant and notes it is exacerbated when passing a bowel movement. She states associated nausea with a few episodes of vomiting and diarrhea, back pain, right ear pain, and dizziness described as room spinning. She also notes her urine "feels hot" when she uses the bathroom but denies any dysuria. She notes she has taken ibuprofen for her pain with mild relief. Pt also denies sore throat and cough.  Past Medical History:  Diagnosis Date  . Anemia   . Depression   . Hyperlipidemia   . Hypothyroidism     Patient Active Problem List   Diagnosis Date Noted  . Cigarette nicotine dependence without complication 99991111  . Anxiety 12/28/2015  . Chronic pain 12/28/2015  . Depression 12/28/2015  . Generalized abdominal pain 11/07/2015  . Tinea corporis 11/07/2015  . Adjustment disorder with mixed anxiety and depressed mood 11/07/2015  . History of peptic ulcer disease   . PUD (peptic ulcer disease) 07/23/2014  . Rectal bleeding 05/29/2014  . Abdominal pain, epigastric 05/29/2014  . GERD (gastroesophageal reflux disease) 05/29/2014  . Anemia 05/29/2014  . Constipation 05/29/2014  . Chest pain 01/20/2014  . Hyperlipidemia 01/20/2014  . Hypothyroidism 01/20/2014    Past Surgical History:    Procedure Laterality Date  . carpel tunnel release     bilaterally  . CESAREAN SECTION    . COLONOSCOPY N/A 06/22/2014   Dr. Rourk:External and internal hemorrhoids-grade 4; otherwise normal rectum total colon and terminal ileum.  . COLPOSCOPY    . ESOPHAGOGASTRODUODENOSCOPY N/A 06/22/2014   Dr. Gala Romney:The mucosa of the esophagus appeared normal  Single gastric ulcer ranging between 3-5 mm in size was found   Biopsy with negative H.pylori.   . ESOPHAGOGASTRODUODENOSCOPY N/A 10/28/2014   Dr. Gala Romney: patulous EG junction, small hiatal hernia, previous noted gastric ulcer healed  . VAGINAL HYSTERECTOMY      OB History    No data available     Home Medications    Prior to Admission medications   Medication Sig Start Date End Date Taking? Authorizing Provider  CALCIUM-VITAMIN D PO Take 1 capsule by mouth daily.    Historical Provider, MD  desvenlafaxine (PRISTIQ) 50 MG 24 hr tablet Take 1 tablet (50 mg total) by mouth daily. 07/19/16   Soyla Dryer, PA-C  ezetimibe-simvastatin (VYTORIN) 10-10 MG tablet Take 1 tablet by mouth at bedtime. 09/13/16   Soyla Dryer, PA-C  IRON PO Take 27 mg by mouth. One every other day    Historical Provider, MD  levothyroxine (LEVOTHROID) 25 MCG tablet Take 1 tablet (25 mcg total) by mouth daily before breakfast. 09/13/16   Soyla Dryer, PA-C  linaclotide (LINZESS) 290 MCG CAPS capsule Take 1 capsule (290 mcg total) by mouth daily. 10/18/16   Carlis Stable, NP  Multiple Vitamins-Minerals (MULTIVITAMIN  WITH MINERALS) tablet Take 1 tablet by mouth daily.    Historical Provider, MD  Omega-3 Fatty Acids (FISH OIL) 1000 MG CAPS Take 1,200 mg by mouth daily.    Historical Provider, MD  omeprazole (PRILOSEC) 20 MG capsule Take 1 capsule (20 mg total) by mouth 2 (two) times daily before a meal. 07/19/16   Soyla Dryer, PA-C    Family History Family History  Problem Relation Age of Onset  . Uterine cancer Mother   . Heart disease Mother   . Cancer Mother    . Heart disease Father     age 45, family questions autopsy  . Cancer Other     ulcer, aunt  . Lung cancer Cousin   . Throat cancer Cousin   . Colon cancer Neg Hx   . Ulcers Neg Hx     Social History Social History  Substance Use Topics  . Smoking status: Current Every Day Smoker    Packs/day: 0.25    Years: 40.00  . Smokeless tobacco: Never Used  . Alcohol use No     Allergies   Morphine and related and Penicillins   Review of Systems Review of Systems  Constitutional: Positive for chills.  HENT: Positive for ear pain. Negative for sore throat.   Respiratory: Negative for cough.   Gastrointestinal: Positive for abdominal pain, diarrhea, nausea and vomiting.  Genitourinary: Negative for dysuria.  Musculoskeletal: Positive for back pain.  Neurological: Positive for dizziness.   Physical Exam Updated Vital Signs BP 135/56 (BP Location: Left Arm)   Pulse 74   Temp 98 F (36.7 C) (Oral)   Resp 18   Ht 5\' 1"  (1.549 m)   Wt 138 lb (62.6 kg)   SpO2 100%   BMI 26.07 kg/m   Physical Exam  Constitutional: She is oriented to person, place, and time. She appears well-developed and well-nourished. No distress.  HENT:  Head: Normocephalic and atraumatic.  Pain in right ear with manipulation of the pinna. External auditory canal clear. Right TM is dull and erythematous. Loss of bony landmarks, does not appear bulging. No effusion.   Eyes: EOM are normal.  Neck: Normal range of motion.  Cardiovascular: Normal rate, regular rhythm and normal heart sounds.   Pulmonary/Chest: Effort normal and breath sounds normal.  Abdominal: Soft. She exhibits no distension. There is tenderness.  Mild tenderness across lower abdomen   Musculoskeletal: Normal range of motion.  Neurological: She is alert and oriented to person, place, and time.  Skin: Skin is warm and dry.  Psychiatric: She has a normal mood and affect. Judgment normal.  Nursing note and vitals reviewed.  ED Treatments  / Results  DIAGNOSTIC STUDIES:  Oxygen Saturation is 100% on RA, normal by my interpretation.    COORDINATION OF CARE:  12:22 PM Discussed treatment plan with pt at bedside and pt agreed to plan.  Labs (all labs ordered are listed, but only abnormal results are displayed) Labs Reviewed - No data to display  EKG  EKG Interpretation None       Radiology No results found.  Procedures Procedures (including critical care time)  Medications Ordered in ED Medications - No data to display  Initial Impression / Assessment and Plan / ED Course  I have reviewed the triage vital signs and the nursing notes.  Pertinent labs & imaging results that were available during my care of the patient were reviewed by me and considered in my medical decision making (see chart for details).  I personally preformed the services scribed in my presence. The recorded information has been reviewed is accurate. Virgel Manifold, MD.   Final Clinical Impressions(s) / ED Diagnoses   Final diagnoses:  Right otitis media, unspecified otitis media type  Vertigo    New Prescriptions New Prescriptions   No medications on file     Virgel Manifold, MD 11/01/16 1337

## 2016-11-01 ENCOUNTER — Ambulatory Visit: Payer: Self-pay | Admitting: Physician Assistant

## 2016-11-01 ENCOUNTER — Encounter: Payer: Self-pay | Admitting: Physician Assistant

## 2016-11-01 ENCOUNTER — Ambulatory Visit (HOSPITAL_COMMUNITY)
Admission: RE | Admit: 2016-11-01 | Discharge: 2016-11-01 | Disposition: A | Discharge status: Home / Self Care | Payer: Medicaid Other | Source: Ambulatory Visit | Attending: Physician Assistant | Admitting: Physician Assistant

## 2016-11-01 VITALS — BP 116/62 | HR 76 | Temp 97.2°F | Ht 60.0 in | Wt 140.5 lb

## 2016-11-01 DIAGNOSIS — F17219 Nicotine dependence, cigarettes, with unspecified nicotine-induced disorders: Secondary | ICD-10-CM

## 2016-11-01 DIAGNOSIS — R51 Headache: Secondary | ICD-10-CM

## 2016-11-01 DIAGNOSIS — R05 Cough: Secondary | ICD-10-CM | POA: Diagnosis present

## 2016-11-01 DIAGNOSIS — B349 Viral infection, unspecified: Secondary | ICD-10-CM

## 2016-11-01 DIAGNOSIS — M791 Myalgia, unspecified site: Secondary | ICD-10-CM

## 2016-11-01 DIAGNOSIS — R509 Fever, unspecified: Secondary | ICD-10-CM

## 2016-11-01 DIAGNOSIS — R059 Cough, unspecified: Secondary | ICD-10-CM

## 2016-11-01 DIAGNOSIS — R42 Dizziness and giddiness: Secondary | ICD-10-CM

## 2016-11-01 DIAGNOSIS — R519 Headache, unspecified: Secondary | ICD-10-CM

## 2016-11-01 DIAGNOSIS — M2550 Pain in unspecified joint: Secondary | ICD-10-CM

## 2016-11-01 LAB — CBC WITH DIFFERENTIAL/PLATELET
Basophils Absolute: 50 cells/uL (ref 0–200)
Basophils Relative: 1 %
EOS PCT: 2 %
Eosinophils Absolute: 100 cells/uL (ref 15–500)
HCT: 35.5 % (ref 35.0–45.0)
HEMOGLOBIN: 11 g/dL — AB (ref 11.7–15.5)
LYMPHS ABS: 2200 {cells}/uL (ref 850–3900)
Lymphocytes Relative: 44 %
MCH: 19.9 pg — ABNORMAL LOW (ref 27.0–33.0)
MCHC: 31 g/dL — AB (ref 32.0–36.0)
MCV: 64.2 fL — ABNORMAL LOW (ref 80.0–100.0)
MONOS PCT: 8 %
MPV: 9.9 fL (ref 7.5–12.5)
Monocytes Absolute: 400 cells/uL (ref 200–950)
NEUTROS PCT: 45 %
Neutro Abs: 2250 cells/uL (ref 1500–7800)
PLATELETS: 307 10*3/uL (ref 140–400)
RBC: 5.53 MIL/uL — ABNORMAL HIGH (ref 3.80–5.10)
RDW: 15.8 % — ABNORMAL HIGH (ref 11.0–15.0)
WBC: 5 10*3/uL (ref 3.8–10.8)

## 2016-11-01 LAB — POCT INFLUENZA A/B
INFLUENZA A, POC: NEGATIVE
Influenza B, POC: NEGATIVE

## 2016-11-01 MED ORDER — MECLIZINE HCL 25 MG PO TABS: 25.0000 mg | ORAL_TABLET | Freq: Three times a day (TID) | ORAL | 0 refills | Status: DC | PRN

## 2016-11-01 MED ORDER — DESVENLAFAXINE SUCCINATE ER 50 MG PO TB24: 50.0000 mg | ORAL_TABLET | Freq: Every day | ORAL | 1 refills | Status: DC

## 2016-11-01 MED ORDER — EZETIMIBE-SIMVASTATIN 10-10 MG PO TABS: 1.0000 | ORAL_TABLET | Freq: Every day | ORAL | 2 refills | Status: DC

## 2016-11-01 NOTE — Patient Instructions (Signed)

## 2016-11-01 NOTE — Progress Notes (Signed)
BP 116/62 (BP Location: Left Arm, Patient Position: Sitting, Cuff Size: Normal)   Pulse 76   Temp 97.2 F (36.2 C) (Other (Comment))   Ht 5' (1.524 m)   Wt 140 lb 8 oz (63.7 kg)   SpO2 96%   BMI 27.44 kg/m    Subjective:    Patient ID: Kathleen Erickson, female    DOB: 12-18-1960, 56 y.o.   MRN: [id number]  HPI: Kathleen Erickson is a 56 y.o. female presenting on 11/01/2016 for Follow-up ("still not feeling well after ER visit", went to ER due to vertigo and flu like symptons, 10-21-16)   HPI  Chief Complaint  Patient presents with  . Follow-up    "still not feeling well after ER visit", went to ER due to vertigo and flu like symptons, 10-21-16    Only lab done in ER was UA.  Uncertain about final dx from provider as note incomplete and unsigned.   Pt says Feels like neck stiff and cracks when she turns it and L shoulder sore and knees hurt and back hurst when she coughs.    Pt still has ringing in ears.  Only a little dizzy when she stands up quickly.  Pt c/o rash on arm getting worse and itching (seen for this in past and sent tod [facility])  Still having headaches. Her R ear still hurts.    She took the zpack and meclizine given to her in the ER.   States temperature before going to ER went to 104 but since going to the ER not that high.  Says 2 days ago it was 102.   Relevant past medical, surgical, family and social history reviewed and updated as indicated. Interim medical history since our last visit reviewed. Allergies and medications reviewed and updated.   Current Outpatient Prescriptions:  .  CALCIUM-VITAMIN D PO, Take 1 capsule by mouth daily., Disp: , Rfl:  .  IRON PO, Take 27 mg by mouth. One every other day, Disp: , Rfl:  .  levothyroxine (LEVOTHROID) 25 MCG tablet, Take 1 tablet (25 mcg total) by mouth daily before breakfast., Disp: 30 tablet, Rfl: 4 .  linaclotide (LINZESS) 290 MCG CAPS capsule, Take 1 capsule (290 mcg total) by mouth daily. (Patient taking  differently: Take 290 mcg by mouth at bedtime. ), Disp: 30 capsule, Rfl: 5 .  Multiple Vitamins-Minerals (MULTIVITAMIN WITH MINERALS) tablet, Take 1 tablet by mouth daily., Disp: , Rfl:  .  Omega-3 Fatty Acids (FISH OIL) 1000 MG CAPS, Take 1,200 mg by mouth daily., Disp: , Rfl:  .  omeprazole (PRILOSEC) 20 MG capsule, Take 1 capsule (20 mg total) by mouth 2 (two) times daily before a meal., Disp: 180 capsule, Rfl: 3 .  desvenlafaxine (PRISTIQ) 50 MG 24 hr tablet, Take 1 tablet (50 mg total) by mouth daily. (Patient not taking: Reported on 11/01/2016), Disp: 7 tablet, Rfl: 3 .  ezetimibe-simvastatin (VYTORIN) 10-10 MG tablet, Take 1 tablet by mouth at bedtime. (Patient not taking: Reported on 11/01/2016), Disp: 30 tablet, Rfl: 2   Review of Systems  Constitutional: Positive for chills and fever. Negative for appetite change, diaphoresis, fatigue and unexpected weight change.  HENT: Positive for congestion, ear pain, sneezing and sore throat. Negative for dental problem, drooling, facial swelling, hearing loss, mouth sores, trouble swallowing and voice change.   Eyes: Positive for pain. Negative for discharge, redness, itching and visual disturbance.  Respiratory: Positive for cough and chest tightness. Negative for choking, shortness of breath  and wheezing.   Cardiovascular: Negative for chest pain, palpitations and leg swelling.  Gastrointestinal: Negative for abdominal pain, blood in stool, constipation, diarrhea and vomiting.  Endocrine: Negative for cold intolerance, heat intolerance and polydipsia.  Genitourinary: Negative for decreased urine volume, dysuria and hematuria.  Musculoskeletal: Positive for arthralgias and back pain. Negative for gait problem.  Skin: Negative for rash.  Allergic/Immunologic: Negative for environmental allergies.  Neurological: Positive for light-headedness and headaches. Negative for seizures and syncope.  Hematological: Negative for adenopathy.   Psychiatric/Behavioral: Negative for agitation, dysphoric mood and suicidal ideas. The patient is not nervous/anxious.     Per HPI unless specifically indicated above     Objective:    BP 116/62 (BP Location: Left Arm, Patient Position: Sitting, Cuff Size: Normal)   Pulse 76   Temp 97.2 F (36.2 C) (Other (Comment))   Ht 5' (1.524 m)   Wt 140 lb 8 oz (63.7 kg)   SpO2 96%   BMI 27.44 kg/m   Wt Readings from Last 3 Encounters:  11/01/16 140 lb 8 oz (63.7 kg)  10/21/16 138 lb (62.6 kg)  07/25/16 139 lb 12.8 oz (63.4 kg)    Physical Exam  Constitutional: She is oriented to person, place, and time. She appears well-developed and well-nourished.  HENT:  Head: Normocephalic and atraumatic.  Right Ear: Hearing, tympanic membrane, external ear and ear canal normal.  Left Ear: Hearing, tympanic membrane, external ear and ear canal normal.  Nose: Nose normal.  Mouth/Throat: Uvula is midline and oropharynx is clear and moist. No oropharyngeal exudate.  Neck: Neck supple.  Cardiovascular: Normal rate and regular rhythm.   Pulmonary/Chest: Effort normal and breath sounds normal. She has no wheezes.  Abdominal: Soft. Bowel sounds are normal. She exhibits no mass. There is no hepatosplenomegaly. There is no tenderness.  Musculoskeletal: She exhibits no edema.  Lymphadenopathy:    She has no cervical adenopathy.  Neurological: She is alert and oriented to person, place, and time.  Skin: Skin is warm and dry.  Psychiatric: She has a normal mood and affect. Her behavior is normal.  Vitals reviewed.   Flu test negative    Assessment & Plan:   Encounter Diagnoses  Name Primary?  . Viral illness Yes  . Myalgia   . Arthralgia of multiple joints   . Dizzy   . Nonintractable headache, unspecified chronicity pattern, unspecified headache type   . Fever, unspecified fever cause   . Cough   . Cigarette nicotine dependence with nicotine-induced disorder      Discussed with pt that  flu test negative. In light of persisting illness despite treatment with Zpack given by ER, will check CBC and CXR today.  Pt counseled to rest, fluids, ibuprofen as needed.  She is given handout for flu but told she does not have the flu- instruction same (ie rest, symptomatic treatment).  Pt states understanding.  Will call pt with results.  She has routine follow up in 2 weeks.  RTO sooner prn

## 2016-11-06 ENCOUNTER — Telehealth: Payer: Self-pay | Admitting: Student

## 2016-11-06 ENCOUNTER — Other Ambulatory Visit: Payer: Self-pay | Admitting: Physician Assistant

## 2016-11-06 MED ORDER — PAROXETINE HCL 20 MG PO TABS: 20.0000 mg | ORAL_TABLET | Freq: Every day | ORAL | 1 refills | Status: DC

## 2016-11-06 NOTE — Telephone Encounter (Addendum)
Called to f/u on last OV and asked the patient how she was feeling, pt stated she was still a little weak, but was feeling better. Pt stated still having knee pain. PA notified and pt is to follow up 11-21-16. Pt verbalized understanding.   Pt also notified that medicaid denied Desvenlafaxine Succinate. Pt was explained that she is to try and fail two preferred agents from the same class on the medicaid preferred list. Pt has only taken citalopram before taking Desvenlafaxine Succinate. PA advised she could send rx for paxil or if pt prefers she could discuss at 11-21-16 OV. Pt preferred having rx for paxil sent to [facility]. PA notified and will rx

## 2016-11-15 ENCOUNTER — Other Ambulatory Visit: Payer: Self-pay

## 2016-11-15 DIAGNOSIS — D649 Anemia, unspecified: Secondary | ICD-10-CM

## 2016-11-15 DIAGNOSIS — E785 Hyperlipidemia, unspecified: Secondary | ICD-10-CM

## 2016-11-15 DIAGNOSIS — E039 Hypothyroidism, unspecified: Secondary | ICD-10-CM

## 2016-11-17 LAB — COMPREHENSIVE METABOLIC PANEL
ALBUMIN: 4.4 g/dL (ref 3.6–5.1)
ALK PHOS: 79 U/L (ref 33–130)
ALT: 10 U/L (ref 6–29)
AST: 17 U/L (ref 10–35)
BILIRUBIN TOTAL: 0.6 mg/dL (ref 0.2–1.2)
BUN: 16 mg/dL (ref 7–25)
CALCIUM: 9.2 mg/dL (ref 8.6–10.4)
CO2: 27 mmol/L (ref 20–31)
Chloride: 107 mmol/L (ref 98–110)
Creat: 0.64 mg/dL (ref 0.50–1.05)
Glucose, Bld: 82 mg/dL (ref 65–99)
POTASSIUM: 4.4 mmol/L (ref 3.5–5.3)
Sodium: 141 mmol/L (ref 135–146)
Total Protein: 7 g/dL (ref 6.1–8.1)

## 2016-11-17 LAB — LIPID PANEL
CHOLESTEROL: 260 mg/dL — AB (ref <200)
HDL: 56 mg/dL (ref >50)
LDL Cholesterol: 190 mg/dL — ABNORMAL HIGH (ref <100)
TRIGLYCERIDES: 72 mg/dL (ref <150)
Total CHOL/HDL Ratio: 4.6 Ratio (ref <5.0)
VLDL: 14 mg/dL (ref <30)

## 2016-11-17 LAB — HEMOGLOBIN: HEMOGLOBIN: 10.6 g/dL — AB (ref 11.7–15.5)

## 2016-11-17 LAB — TSH: TSH: 5.27 mIU/L — ABNORMAL HIGH

## 2016-11-21 ENCOUNTER — Encounter: Payer: Self-pay | Admitting: Physician Assistant

## 2016-11-21 ENCOUNTER — Ambulatory Visit: Payer: Self-pay | Admitting: Physician Assistant

## 2016-11-21 VITALS — BP 102/58 | HR 83 | Temp 97.5°F | Ht 60.0 in | Wt 140.0 lb

## 2016-11-21 DIAGNOSIS — E785 Hyperlipidemia, unspecified: Secondary | ICD-10-CM

## 2016-11-21 DIAGNOSIS — E039 Hypothyroidism, unspecified: Secondary | ICD-10-CM

## 2016-11-21 DIAGNOSIS — R21 Rash and other nonspecific skin eruption: Secondary | ICD-10-CM

## 2016-11-21 DIAGNOSIS — F419 Anxiety disorder, unspecified: Secondary | ICD-10-CM

## 2016-11-21 DIAGNOSIS — Z1239 Encounter for other screening for malignant neoplasm of breast: Secondary | ICD-10-CM

## 2016-11-21 DIAGNOSIS — D649 Anemia, unspecified: Secondary | ICD-10-CM

## 2016-11-21 DIAGNOSIS — K219 Gastro-esophageal reflux disease without esophagitis: Secondary | ICD-10-CM

## 2016-11-21 DIAGNOSIS — F17219 Nicotine dependence, cigarettes, with unspecified nicotine-induced disorders: Secondary | ICD-10-CM

## 2016-11-21 MED ORDER — DESVENLAFAXINE SUCCINATE ER 50 MG PO TB24: 50.0000 mg | ORAL_TABLET | Freq: Every day | ORAL | 1 refills | Status: DC

## 2016-11-21 NOTE — Patient Instructions (Signed)
Iron-Rich Diet Iron is a mineral that helps your body to produce hemoglobin. Hemoglobin is a protein in your red blood cells that carries oxygen to your body's tissues. Eating too little iron may cause you to feel weak and tired, and it can increase your risk for infection. Eating enough iron is necessary for your body's metabolism, muscle function, and nervous system. Iron is naturally found in many foods. It can also be added to foods or fortified in foods. There are two types of dietary iron:  Heme iron. Heme iron is absorbed by the body more easily than nonheme iron. Heme iron is found in meat, poultry, and fish.  Nonheme iron. Nonheme iron is found in dietary supplements, iron-fortified grains, beans, and vegetables. You may need to follow an iron-rich diet if:  You have been diagnosed with iron deficiency or iron-deficiency anemia.  You have a condition that prevents you from absorbing dietary iron, such as:  Infection in your intestines.  Celiac disease. This involves long-lasting (chronic) inflammation of your intestines.  You do not eat enough iron.  You eat a diet that is high in foods that impair iron absorption.  You have lost a lot of blood.  You have heavy bleeding during your menstrual cycle.  You are pregnant. What is my plan? Your health care provider may help you to determine how much iron you need per day based on your condition. Generally, when a person consumes sufficient amounts of iron in the diet, the following iron needs are met:  Men.  24-56 years old: 11 mg per day.  59-71 years old: 8 mg per day.  Women.  46-49 years old: 15 mg per day.  61-31 years old: 18 mg per day.  Over 8 years old: 8 mg per day.  Pregnant women: 27 mg per day.  Breastfeeding women: 9 mg per day. What do I need to know about an iron-rich diet?  Eat fresh fruits and vegetables that are high in vitamin C along with foods that are high in iron. This will help increase the  amount of iron that your body absorbs from food, especially with foods containing nonheme iron. Foods that are high in vitamin C include oranges, peppers, tomatoes, and mango.  Take iron supplements only as directed by your health care provider. Overdose of iron can be life-threatening. If you were prescribed iron supplements, take them with orange juice or a vitamin C supplement.  Cook foods in pots and pans that are made from iron.  Eat nonheme iron-containing foods alongside foods that are high in heme iron. This helps to improve your iron absorption.  Certain foods and drinks contain compounds that impair iron absorption. Avoid eating these foods in the same meal as iron-rich foods or with iron supplements. These include:  Coffee, black tea, and red wine.  Milk, dairy products, and foods that are high in calcium.  Beans, soybeans, and peas.  Whole grains.  When eating foods that contain both nonheme iron and compounds that impair iron absorption, follow these tips to absorb iron better.  Soak beans overnight before cooking.  Soak whole grains overnight and drain them before using.  Ferment flours before baking, such as using yeast in bread dough. What foods can I eat? Grains  Iron-fortified breakfast cereal. Iron-fortified whole-wheat bread. Enriched rice. Sprouted grains. Vegetables  Spinach. Potatoes with skin. Green peas. Broccoli. Red and green bell peppers. Fermented vegetables. Fruits  Prunes. Raisins. Oranges. Strawberries. Mango. Grapefruit. Meats and Other Protein Sources  Sources  °Beef liver. Oysters. Beef. Shrimp. Turkey. Chicken. Tuna. Sardines. Chickpeas. Nuts. Tofu. °Beverages  °Tomato juice. Fresh orange juice. Prune juice. Hibiscus tea. Fortified instant breakfast shakes. °Condiments  °Tahini. Fermented soy sauce. °Sweets and Desserts  °Black-strap molasses. °Other  °Wheat germ. °The items listed above may not be a complete list of recommended foods or beverages.  Contact your dietitian for more options.  °What foods are not recommended? °Grains  °Whole grains. Bran cereal. Bran flour. Oats. °Vegetables  °Artichokes. Brussels sprouts. Kale. °Fruits  °Blueberries. Raspberries. Strawberries. Figs. °Meats and Other Protein Sources  °Soybeans. Products made from soy protein. °Dairy  °Milk. Cream. Cheese. Yogurt. Cottage cheese. °Beverages  °Coffee. Black tea. Red wine. °Sweets and Desserts  °Cocoa. Chocolate. Ice cream. °Other  °Basil. Oregano. Parsley. °The items listed above may not be a complete list of foods and beverages to avoid. Contact your dietitian for more information.  °This information is not intended to replace advice given to you by your health care provider. Make sure you discuss any questions you have with your health care provider. °Document Released: 04/25/2005 Document Revised: 03/31/2016 Document Reviewed: 04/08/2014 °Elsevier Interactive Patient Education © 2017 [employer]. ° ° ° °

## 2016-11-21 NOTE — Progress Notes (Signed)
BP (!) 102/58 (BP Location: Left Arm, Patient Position: Sitting, Cuff Size: Normal)   Pulse 83   Temp 97.5 F (36.4 C)   Ht 5' (1.524 m)   Wt 140 lb (63.5 kg)   SpO2 98%   BMI 27.34 kg/m    Subjective:    Patient ID: Kathleen Erickson, female    DOB: 1961/02/12, 56 y.o.   MRN: [id number]  HPI: MANJU SHUFORD is a 56 y.o. female presenting on 11/21/2016 for Hyperlipidemia; Thyroid Problem; and Medication Problem (pt states paxil makes her have "weird thoughts" and makes her jittery)   HPI   Pt states she doesn't like paxil it makes her have crazy dreams and weird thoughts.  Like it would be okay for her to do something that she knows isn't right.    Pt has vytorin waiting for her at the pharmacy.  Relevant past medical, surgical, family and social history reviewed and updated as indicated. Interim medical history since our last visit reviewed. Allergies and medications reviewed and updated.   Current Outpatient Prescriptions:  .  bisacodyl (DULCOLAX) 5 MG EC tablet, Take 5 mg by mouth daily as needed for moderate constipation., Disp: , Rfl:  .  CALCIUM-VITAMIN D PO, Take 1 capsule by mouth daily., Disp: , Rfl:  .  IRON PO, Take 27 mg by mouth. One every other day, Disp: , Rfl:  .  levothyroxine (LEVOTHROID) 25 MCG tablet, Take 1 tablet (25 mcg total) by mouth daily before breakfast., Disp: 30 tablet, Rfl: 4 .  linaclotide (LINZESS) 290 MCG CAPS capsule, Take 1 capsule (290 mcg total) by mouth daily. (Patient taking differently: Take 290 mcg by mouth at bedtime. ), Disp: 30 capsule, Rfl: 5 .  meclizine (ANTIVERT) 25 MG tablet, Take 1 tablet (25 mg total) by mouth 3 (three) times daily as needed for dizziness., Disp: 30 tablet, Rfl: 0 .  Multiple Vitamins-Minerals (MULTIVITAMIN WITH MINERALS) tablet, Take 1 tablet by mouth daily., Disp: , Rfl:  .  Omega-3 Fatty Acids (FISH OIL) 1000 MG CAPS, Take 1,200 mg by mouth daily., Disp: , Rfl:  .  omeprazole (PRILOSEC) 20 MG capsule, Take 1  capsule (20 mg total) by mouth 2 (two) times daily before a meal., Disp: 180 capsule, Rfl: 3 .  PARoxetine (PAXIL) 20 MG tablet, Take 1 tablet (20 mg total) by mouth daily. (Patient taking differently: Take 10 mg by mouth daily. ), Disp: 30 tablet, Rfl: 1 .  desvenlafaxine (PRISTIQ) 50 MG 24 hr tablet, Take 1 tablet (50 mg total) by mouth daily. (Patient not taking: Reported on 11/21/2016), Disp: 30 tablet, Rfl: 1 .  ezetimibe-simvastatin (VYTORIN) 10-10 MG tablet, Take 1 tablet by mouth at bedtime. (Patient not taking: Reported on 11/21/2016), Disp: 30 tablet, Rfl: 2   Review of Systems  Constitutional: Negative for appetite change, chills, diaphoresis, fatigue, fever and unexpected weight change.  HENT: Positive for dental problem and ear pain (R ear itches). Negative for congestion, drooling, facial swelling, hearing loss, mouth sores, sneezing, sore throat, trouble swallowing and voice change.   Eyes: Negative for pain, discharge, redness, itching and visual disturbance.  Respiratory: Negative for cough, choking, shortness of breath and wheezing.   Cardiovascular: Negative for chest pain, palpitations and leg swelling.  Gastrointestinal: Negative for abdominal pain, blood in stool, constipation, diarrhea and vomiting.  Endocrine: Negative for cold intolerance, heat intolerance and polydipsia.  Genitourinary: Negative for decreased urine volume, dysuria and hematuria.  Musculoskeletal: Positive for arthralgias and back pain. Negative  for gait problem.  Skin: Negative for rash.  Allergic/Immunologic: Negative for environmental allergies.  Neurological: Negative for seizures, syncope, light-headedness and headaches.  Hematological: Negative for adenopathy.  Psychiatric/Behavioral: Positive for dysphoric mood. Negative for agitation and suicidal ideas. The patient is nervous/anxious.     Per HPI unless specifically indicated above     Objective:    BP (!) 102/58 (BP Location: Left Arm,  Patient Position: Sitting, Cuff Size: Normal)   Pulse 83   Temp 97.5 F (36.4 C)   Ht 5' (1.524 m)   Wt 140 lb (63.5 kg)   SpO2 98%   BMI 27.34 kg/m   Wt Readings from Last 3 Encounters:  11/21/16 140 lb (63.5 kg)  11/01/16 140 lb 8 oz (63.7 kg)  10/21/16 138 lb (62.6 kg)    Physical Exam  Constitutional: She is oriented to person, place, and time. She appears well-developed and well-nourished.  HENT:  Head: Normocephalic and atraumatic.  Right Ear: Tympanic membrane, external ear and ear canal normal.  Left Ear: Tympanic membrane, external ear and ear canal normal.  Neck: Neck supple.  Cardiovascular: Normal rate and regular rhythm.   Pulmonary/Chest: Effort normal and breath sounds normal.  Abdominal: Soft. Bowel sounds are normal. She exhibits no mass. There is no hepatosplenomegaly. There is no tenderness.  Musculoskeletal: She exhibits no edema.  Lymphadenopathy:    She has no cervical adenopathy.  Neurological: She is alert and oriented to person, place, and time.  Skin: Skin is warm and dry. Rash noted.  Multiple round patches on BUE  Psychiatric: She has a normal mood and affect. Her behavior is normal.  Vitals reviewed.   Results for orders placed or performed in visit on 11/15/16  TSH  Result Value Ref Range   TSH 5.27 (H) mIU/L  Hemoglobin  Result Value Ref Range   Hemoglobin 10.6 (L) 11.7 - 15.5 g/dL  Lipid Profile  Result Value Ref Range   Cholesterol 260 (H) <200 mg/dL   Triglycerides 72 <150 mg/dL   HDL 56 >50 mg/dL   Total CHOL/HDL Ratio 4.6 <5.0 Ratio   VLDL 14 <30 mg/dL   LDL Cholesterol 190 (H) <100 mg/dL  Comprehensive metabolic panel  Result Value Ref Range   Sodium 141 135 - 146 mmol/L   Potassium 4.4 3.5 - 5.3 mmol/L   Chloride 107 98 - 110 mmol/L   CO2 27 20 - 31 mmol/L   Glucose, Bld 82 65 - 99 mg/dL   BUN 16 7 - 25 mg/dL   Creat 0.64 0.50 - 1.05 mg/dL   Total Bilirubin 0.6 0.2 - 1.2 mg/dL   Alkaline Phosphatase 79 33 - 130 U/L    AST 17 10 - 35 U/L   ALT 10 6 - 29 U/L   Total Protein 7.0 6.1 - 8.1 g/dL   Albumin 4.4 3.6 - 5.1 g/dL   Calcium 9.2 8.6 - 10.4 mg/dL      Assessment & Plan:   Encounter Diagnoses  Name Primary?  . Hypothyroidism, unspecified type Yes  . Hyperlipidemia, unspecified hyperlipidemia type   . Anemia, unspecified type   . Cigarette nicotine dependence with nicotine-induced disorder   . Anxiety   . Rash   . Gastroesophageal reflux disease, esophagitis presence not specified   . Screening for breast cancer      -order Mammogram (has medicaid) -Refer back to dermatology for rash- she has been treated by myself and by dermatology but her rash persists -Restart pristiq since pt intolerant  paxil and citalopram -Discussed anemia.  Pt states iron makes her too constipated to take daily.  Encouraged to eat iron-rich diet and gave handout.  -pt to continue other medications -follow up 1 month to recheck mood after having gotten back on pristiq

## 2016-12-05 ENCOUNTER — Other Ambulatory Visit: Payer: Self-pay | Admitting: Physician Assistant

## 2016-12-05 DIAGNOSIS — Z1239 Encounter for other screening for malignant neoplasm of breast: Secondary | ICD-10-CM

## 2016-12-06 ENCOUNTER — Other Ambulatory Visit: Payer: Self-pay | Admitting: Physician Assistant

## 2016-12-06 DIAGNOSIS — N6489 Other specified disorders of breast: Secondary | ICD-10-CM

## 2016-12-06 DIAGNOSIS — Q838 Other congenital malformations of breast: Secondary | ICD-10-CM

## 2016-12-19 ENCOUNTER — Encounter: Payer: Self-pay | Admitting: Physician Assistant

## 2016-12-19 ENCOUNTER — Ambulatory Visit: Payer: Self-pay | Admitting: Physician Assistant

## 2016-12-19 VITALS — BP 114/60 | HR 72 | Temp 97.5°F | Ht 60.0 in | Wt 142.5 lb

## 2016-12-19 DIAGNOSIS — F17210 Nicotine dependence, cigarettes, uncomplicated: Secondary | ICD-10-CM

## 2016-12-19 DIAGNOSIS — K219 Gastro-esophageal reflux disease without esophagitis: Secondary | ICD-10-CM

## 2016-12-19 DIAGNOSIS — F17219 Nicotine dependence, cigarettes, with unspecified nicotine-induced disorders: Secondary | ICD-10-CM

## 2016-12-19 DIAGNOSIS — E785 Hyperlipidemia, unspecified: Secondary | ICD-10-CM

## 2016-12-19 DIAGNOSIS — F419 Anxiety disorder, unspecified: Secondary | ICD-10-CM

## 2016-12-19 DIAGNOSIS — D649 Anemia, unspecified: Secondary | ICD-10-CM

## 2016-12-19 DIAGNOSIS — E039 Hypothyroidism, unspecified: Secondary | ICD-10-CM

## 2016-12-19 DIAGNOSIS — R21 Rash and other nonspecific skin eruption: Secondary | ICD-10-CM

## 2016-12-19 MED ORDER — EZETIMIBE 10 MG PO TABS: 10.0000 mg | ORAL_TABLET | Freq: Every day | ORAL | 3 refills | Status: DC

## 2016-12-19 MED ORDER — SIMVASTATIN 20 MG PO TABS: 20.0000 mg | ORAL_TABLET | Freq: Every day | ORAL | 3 refills | Status: DC

## 2016-12-19 MED ORDER — ZETIA 10 MG PO TABS: 10.0000 mg | ORAL_TABLET | Freq: Every day | ORAL | 3 refills | Status: DC

## 2016-12-19 NOTE — Progress Notes (Signed)
BP 114/60 (BP Location: Left Arm, Patient Position: Sitting, Cuff Size: Normal)   Pulse 72   Temp 97.5 F (36.4 C) (Other (Comment))   Ht 5' (1.524 m)   Wt 142 lb 8 oz (64.6 kg)   SpO2 95%   BMI 27.83 kg/m    Subjective:    Patient ID: Kathleen Erickson, female    DOB: Feb 15, 1961, 56 y.o.   MRN: [id number]  HPI: Kathleen Erickson is a 56 y.o. female presenting on 12/19/2016 for Mental Health Problem   HPI   Pt says she is doing well being back on the pristiq.  She feels it is working well for her.  She is still smoking  Relevant past medical, surgical, family and social history reviewed and updated as indicated. Interim medical history since our last visit reviewed. Allergies and medications reviewed and updated.   Current Outpatient Prescriptions:  .  bisacodyl (DULCOLAX) 5 MG EC tablet, Take 5 mg by mouth daily as needed for moderate constipation., Disp: , Rfl:  .  CALCIUM-VITAMIN D PO, Take 1 capsule by mouth daily., Disp: , Rfl:  .  desvenlafaxine (PRISTIQ) 50 MG 24 hr tablet, Take 1 tablet (50 mg total) by mouth daily., Disp: 30 tablet, Rfl: 1 .  ezetimibe-simvastatin (VYTORIN) 10-10 MG tablet, Take 1 tablet by mouth at bedtime., Disp: 30 tablet, Rfl: 2 .  IRON PO, Take 27 mg by mouth. One every other day, Disp: , Rfl:  .  levothyroxine (LEVOTHROID) 25 MCG tablet, Take 1 tablet (25 mcg total) by mouth daily before breakfast., Disp: 30 tablet, Rfl: 4 .  linaclotide (LINZESS) 290 MCG CAPS capsule, Take 1 capsule (290 mcg total) by mouth daily. (Patient taking differently: Take 290 mcg by mouth at bedtime. ), Disp: 30 capsule, Rfl: 5 .  meclizine (ANTIVERT) 25 MG tablet, Take 1 tablet (25 mg total) by mouth 3 (three) times daily as needed for dizziness., Disp: 30 tablet, Rfl: 0 .  Multiple Vitamins-Minerals (MULTIVITAMIN WITH MINERALS) tablet, Take 1 tablet by mouth daily., Disp: , Rfl:  .  Omega-3 Fatty Acids (FISH OIL) 1000 MG CAPS, Take 1,200 mg by mouth daily., Disp: , Rfl:   .  omeprazole (PRILOSEC) 20 MG capsule, Take 1 capsule (20 mg total) by mouth 2 (two) times daily before a meal., Disp: 180 capsule, Rfl: 3  Review of Systems  Constitutional: Negative for appetite change, chills, diaphoresis, fatigue, fever and unexpected weight change.  HENT: Positive for dental problem. Negative for congestion, drooling, ear pain, facial swelling, hearing loss, mouth sores, sneezing, sore throat, trouble swallowing and voice change.   Eyes: Negative for pain, discharge, redness, itching and visual disturbance.  Respiratory: Negative for cough, choking, shortness of breath and wheezing.   Cardiovascular: Negative for chest pain, palpitations and leg swelling.  Gastrointestinal: Negative for abdominal pain, blood in stool, constipation, diarrhea and vomiting.  Endocrine: Negative for cold intolerance, heat intolerance and polydipsia.  Genitourinary: Negative for decreased urine volume, dysuria and hematuria.  Musculoskeletal: Positive for arthralgias and back pain. Negative for gait problem.  Skin: Negative for rash.  Allergic/Immunologic: Negative for environmental allergies.  Neurological: Positive for headaches. Negative for seizures, syncope and light-headedness.  Hematological: Negative for adenopathy.  Psychiatric/Behavioral: Negative for agitation, dysphoric mood and suicidal ideas. The patient is not nervous/anxious.     Per HPI unless specifically indicated above     Objective:    BP 114/60 (BP Location: Left Arm, Patient Position: Sitting, Cuff Size: Normal)   Pulse  72   Temp 97.5 F (36.4 C) (Other (Comment))   Ht 5' (1.524 m)   Wt 142 lb 8 oz (64.6 kg)   SpO2 95%   BMI 27.83 kg/m   Wt Readings from Last 3 Encounters:  12/19/16 142 lb 8 oz (64.6 kg)  11/21/16 140 lb (63.5 kg)  11/01/16 140 lb 8 oz (63.7 kg)    Physical Exam  Constitutional: She is oriented to person, place, and time. She appears well-developed and well-nourished.  HENT:  Head:  Normocephalic and atraumatic.  Neck: Neck supple.  Cardiovascular: Normal rate and regular rhythm.   Pulmonary/Chest: Effort normal and breath sounds normal.  Abdominal: Soft. Bowel sounds are normal. She exhibits no mass. There is no hepatosplenomegaly. There is no tenderness.  Musculoskeletal: She exhibits no edema.  Lymphadenopathy:    She has no cervical adenopathy.  Neurological: She is alert and oriented to person, place, and time.  Skin: Skin is warm and dry.  Psychiatric: She has a normal mood and affect. Her behavior is normal.  Vitals reviewed.       Assessment & Plan:   Encounter Diagnoses  Name Primary?  Marland Kitchen Anxiety Yes  . Hyperlipidemia, unspecified hyperlipidemia type   . Cigarette nicotine dependence with nicotine-induced disorder   . Rash   . Hypothyroidism, unspecified type   . Anemia, unspecified type   . Gastroesophageal reflux disease, esophagitis presence not specified   . Cigarette nicotine dependence without complication     -continue pristiq for mood -since medicaid doesn't cover vytorin, will change to zetia and simvastatin.  Pt given written instructions on this to prevent confusion -will have nurse check on pt's dermatology referral -pt counseled on smoking cessation -follow up 3 months.  RTO sooner prn

## 2016-12-19 NOTE — Patient Instructions (Addendum)
Change:  stop vytorin (ezetimibe-simvastatin)  Due to medicaid won't pay for it and start zetia(ezetimibe) and zocor (simvastatin)

## 2016-12-26 ENCOUNTER — Encounter (HOSPITAL_COMMUNITY): Payer: Medicaid Other

## 2016-12-26 ENCOUNTER — Ambulatory Visit (HOSPITAL_COMMUNITY)
Admission: RE | Admit: 2016-12-26 | Discharge: 2016-12-26 | Disposition: A | Discharge status: Home / Self Care | Payer: Medicaid Other | Source: Ambulatory Visit | Attending: Physician Assistant | Admitting: Physician Assistant

## 2016-12-26 DIAGNOSIS — N6489 Other specified disorders of breast: Secondary | ICD-10-CM | POA: Diagnosis not present

## 2016-12-26 DIAGNOSIS — Q838 Other congenital malformations of breast: Secondary | ICD-10-CM

## 2017-01-15 ENCOUNTER — Other Ambulatory Visit: Payer: Self-pay | Admitting: Physician Assistant

## 2017-01-24 ENCOUNTER — Other Ambulatory Visit: Payer: Self-pay | Admitting: Physician Assistant

## 2017-01-27 ENCOUNTER — Other Ambulatory Visit: Payer: Self-pay | Admitting: Physician Assistant

## 2017-01-30 ENCOUNTER — Other Ambulatory Visit: Payer: Self-pay | Admitting: Physician Assistant

## 2017-01-31 ENCOUNTER — Emergency Department (HOSPITAL_COMMUNITY)
Admission: EM | Admit: 2017-01-31 | Discharge: 2017-01-31 | Disposition: A | Discharge status: Home / Self Care | Payer: Medicaid Other | Attending: Emergency Medicine | Admitting: Emergency Medicine

## 2017-01-31 ENCOUNTER — Ambulatory Visit: Payer: Self-pay | Admitting: Physician Assistant

## 2017-01-31 ENCOUNTER — Encounter (HOSPITAL_COMMUNITY): Payer: Self-pay | Admitting: *Deleted

## 2017-01-31 ENCOUNTER — Encounter: Payer: Self-pay | Admitting: Physician Assistant

## 2017-01-31 VITALS — BP 110/60 | HR 84 | Temp 97.7°F | Wt 141.0 lb

## 2017-01-31 DIAGNOSIS — S61012A Laceration without foreign body of left thumb without damage to nail, initial encounter: Secondary | ICD-10-CM

## 2017-01-31 DIAGNOSIS — Z79899 Other long term (current) drug therapy: Secondary | ICD-10-CM | POA: Insufficient documentation

## 2017-01-31 DIAGNOSIS — Y999 Unspecified external cause status: Secondary | ICD-10-CM | POA: Insufficient documentation

## 2017-01-31 DIAGNOSIS — E039 Hypothyroidism, unspecified: Secondary | ICD-10-CM | POA: Diagnosis not present

## 2017-01-31 DIAGNOSIS — F17200 Nicotine dependence, unspecified, uncomplicated: Secondary | ICD-10-CM | POA: Insufficient documentation

## 2017-01-31 DIAGNOSIS — S61002A Unspecified open wound of left thumb without damage to nail, initial encounter: Secondary | ICD-10-CM

## 2017-01-31 DIAGNOSIS — J441 Chronic obstructive pulmonary disease with (acute) exacerbation: Secondary | ICD-10-CM

## 2017-01-31 DIAGNOSIS — Z23 Encounter for immunization: Secondary | ICD-10-CM | POA: Diagnosis not present

## 2017-01-31 DIAGNOSIS — W268XXA Contact with other sharp object(s), not elsewhere classified, initial encounter: Secondary | ICD-10-CM | POA: Diagnosis not present

## 2017-01-31 DIAGNOSIS — Y939 Activity, unspecified: Secondary | ICD-10-CM | POA: Diagnosis not present

## 2017-01-31 DIAGNOSIS — F17219 Nicotine dependence, cigarettes, with unspecified nicotine-induced disorders: Secondary | ICD-10-CM

## 2017-01-31 DIAGNOSIS — Y929 Unspecified place or not applicable: Secondary | ICD-10-CM | POA: Insufficient documentation

## 2017-01-31 MED ORDER — DESVENLAFAXINE SUCCINATE ER 50 MG PO TB24: 50.0000 mg | ORAL_TABLET | Freq: Every day | ORAL | 2 refills | Status: DC

## 2017-01-31 MED ORDER — PREDNISONE 10 MG PO TABS: ORAL_TABLET | ORAL | 0 refills | Status: AC

## 2017-01-31 MED ORDER — ALBUTEROL SULFATE HFA 108 (90 BASE) MCG/ACT IN AERS: 2.0000 | INHALATION_SPRAY | Freq: Four times a day (QID) | RESPIRATORY_TRACT | 1 refills | Status: DC | PRN

## 2017-01-31 MED ORDER — TETANUS-DIPHTH-ACELL PERTUSSIS 5-2.5-18.5 LF-MCG/0.5 IM SUSP
0.5000 mL | Freq: Once | INTRAMUSCULAR | Status: AC
  Administered 2017-01-31: 0.5 mL via INTRAMUSCULAR
  Filled 2017-01-31: qty 0.5

## 2017-01-31 NOTE — ED Provider Notes (Signed)
[facility] DEPT Provider Note   CSN: [id number] Arrival date & time: 01/31/17  1516     History   Chief Complaint Chief Complaint  Patient presents with  . Needs Tetanus Shot    HPI Kathleen Erickson is a 56 y.o. female.  Pt has a cut to her finger.  Pt went to her MD for a tetanus shot.    The history is provided by the patient. No language interpreter was used.    Past Medical History:  Diagnosis Date  . Anemia   . Depression   . Hyperlipidemia   . Hypothyroidism     Patient Active Problem List   Diagnosis Date Noted  . Cigarette nicotine dependence without complication 03/54/6568  . Anxiety 12/28/2015  . Chronic pain 12/28/2015  . Depression 12/28/2015  . Generalized abdominal pain 11/07/2015  . Tinea corporis 11/07/2015  . Adjustment disorder with mixed anxiety and depressed mood 11/07/2015  . History of peptic ulcer disease   . PUD (peptic ulcer disease) 07/23/2014  . Rectal bleeding 05/29/2014  . Abdominal pain, epigastric 05/29/2014  . GERD (gastroesophageal reflux disease) 05/29/2014  . Anemia 05/29/2014  . Constipation 05/29/2014  . Chest pain 01/20/2014  . Hyperlipidemia 01/20/2014  . Hypothyroidism 01/20/2014    Past Surgical History:  Procedure Laterality Date  . carpel tunnel release     bilaterally  . CESAREAN SECTION    . COLONOSCOPY N/A 06/22/2014   Dr. Rourk:External and internal hemorrhoids-grade 4; otherwise normal rectum total colon and terminal ileum.  . COLPOSCOPY    . ESOPHAGOGASTRODUODENOSCOPY N/A 06/22/2014   Dr. Gala Romney:The mucosa of the esophagus appeared normal  Single gastric ulcer ranging between 3-5 mm in size was found   Biopsy with negative H.pylori.   . ESOPHAGOGASTRODUODENOSCOPY N/A 10/28/2014   Dr. Gala Romney: patulous EG junction, small hiatal hernia, previous noted gastric ulcer healed  . VAGINAL HYSTERECTOMY      OB History    No data available       Home Medications    Prior to Admission medications   Medication  Sig Start Date End Date Taking? Authorizing Provider  albuterol (PROVENTIL HFA;VENTOLIN HFA) 108 (90 Base) MCG/ACT inhaler Inhale 2 puffs into the lungs every 6 (six) hours as needed for wheezing or shortness of breath. 01/31/17   Soyla Dryer, PA-C  bisacodyl (DULCOLAX) 5 MG EC tablet Take 5 mg by mouth daily as needed for moderate constipation.    [provider]  CALCIUM-VITAMIN D PO Take 1 capsule by mouth daily.    [provider]  desvenlafaxine (PRISTIQ) 50 MG 24 hr tablet Take 1 tablet (50 mg total) by mouth daily. 01/31/17   Soyla Dryer, PA-C  ezetimibe-simvastatin (VYTORIN) 10-10 MG tablet Take 1 tablet by mouth at bedtime. Patient not taking: Reported on 01/31/2017 11/01/16   Soyla Dryer, PA-C  IRON PO Take 27 mg by mouth. One every other day    [provider]  levothyroxine (SYNTHROID, LEVOTHROID) 25 MCG tablet TAKE 1 TABLET BY MOUTH EVERY MORNING BEFORE BREAKFAST 01/31/17   Soyla Dryer, PA-C  linaclotide (LINZESS) 290 MCG CAPS capsule Take 1 capsule (290 mcg total) by mouth daily. Patient taking differently: Take 290 mcg by mouth at bedtime.  10/18/16   Carlis Stable, NP  meclizine (ANTIVERT) 25 MG tablet Take 1 tablet (25 mg total) by mouth 3 (three) times daily as needed for dizziness. 11/01/16   Soyla Dryer, PA-C  Multiple Vitamins-Minerals (MULTIVITAMIN WITH MINERALS) tablet Take 1 tablet by mouth  daily.    [provider]  Omega-3 Fatty Acids (FISH OIL) 1000 MG CAPS Take 1,200 mg by mouth daily.    [provider]  omeprazole (PRILOSEC) 20 MG capsule Take 1 capsule (20 mg total) by mouth 2 (two) times daily before a meal. 07/19/16   Soyla Dryer, PA-C  predniSONE (DELTASONE) 10 MG tablet Day 1 take 6 tab po qam. Day 2 take 5 tab po qam. Day 3 take 4 tab po qam. Day 4 take 3 tab qam. Day 5 take 2 tab qam. Day 6 take 1 tab qam. 01/31/17 02/06/17  Soyla Dryer, PA-C  simethicone (MYLICON) 585 MG chewable tablet Chew 250 mg by  mouth every 6 (six) hours as needed for flatulence.    [provider]  simvastatin (ZOCOR) 20 MG tablet Take 1 tablet (20 mg total) by mouth at bedtime. 12/19/16   Soyla Dryer, PA-C  ZETIA 10 MG tablet Take 1 tablet (10 mg total) by mouth daily. Patient not taking: Reported on 01/31/2017 12/19/16   Soyla Dryer, PA-C    Family History Family History  Problem Relation Age of Onset  . Uterine cancer Mother   . Heart disease Mother   . Cancer Mother   . Heart disease Father     age 67, family questions autopsy  . Cancer Other     ulcer, aunt  . Lung cancer Cousin   . Throat cancer Cousin   . Colon cancer Neg Hx   . Ulcers Neg Hx     Social History Social History  Substance Use Topics  . Smoking status: Current Every Day Smoker    Packs/day: 0.25    Years: 40.00  . Smokeless tobacco: Never Used     Comment: since 11-21-16, has smoked 2 cigs a day  . Alcohol use No     Allergies   Morphine and related and Penicillins   Review of Systems Review of Systems  All other systems reviewed and are negative.    Physical Exam Updated Vital Signs BP 126/61 (BP Location: Right Arm)   Pulse 84   Temp 99.1 F (37.3 C) (Oral)   Resp 18   Ht 5' (1.524 m)   Wt 63.5 kg   SpO2 97%   BMI 27.34 kg/m   Physical Exam  Constitutional: She appears well-developed and well-nourished. No distress.  HENT:  Head: Normocephalic and atraumatic.  Cardiovascular:  No murmur heard. Pulmonary/Chest: No respiratory distress.  Abdominal: There is no tenderness.  Musculoskeletal: She exhibits no edema.  Neurological: She is alert.  Skin: Skin is warm and dry.  superficail abrasion left thumb  Psychiatric: She has a normal mood and affect.  Nursing note and vitals reviewed.    ED Treatments / Results  Labs (all labs ordered are listed, but only abnormal results are displayed) Labs Reviewed - No data to display  EKG  EKG Interpretation None       Radiology No  results found.  Procedures Procedures (including critical care time)  Medications Ordered in ED Medications  Tdap (BOOSTRIX) injection 0.5 mL (not administered)     Initial Impression / Assessment and Plan / ED Course  I have reviewed the triage vital signs and the nursing notes.  Pertinent labs & imaging results that were available during my care of the patient were reviewed by me and considered in my medical decision making (see chart for details).     Tetanus  Final Clinical Impressions(s) / ED Diagnoses   Final  diagnoses:  Laceration of left thumb without foreign body, nail damage status unspecified, initial encounter    New Prescriptions New Prescriptions   No medications on file     Sidney Ace 01/31/17 1624    Julianne Rice, MD 02/05/17 [phone]

## 2017-01-31 NOTE — ED Triage Notes (Signed)
Pt scratched finger on a window cleaner yesterday. She wants a tetanus shot.   Pt was told to come here by PCP.

## 2017-01-31 NOTE — Discharge Instructions (Signed)
Return if any problems.

## 2017-01-31 NOTE — Progress Notes (Signed)
BP 110/60 (BP Location: Left Arm, Patient Position: Sitting, Cuff Size: Normal)   Pulse 84   Temp 97.7 F (36.5 C)   Wt 141 lb (64 kg)   SpO2 97%   BMI 27.54 kg/m    Subjective:    Patient ID: Kathleen Erickson, female    DOB: [birth year], 56 y.o.   MRN: [id number]  HPI: Kathleen Erickson is a 56 y.o. female presenting on 01/31/2017 for Cough (for about 3-4 days.)   HPI   Chief Complaint  Patient presents with  . Cough    for about 3-4 days.    Lots of congestion.  Also hurts when coughing.  + nasal running.   Also pt cut L thumb yesterday with paint strippper.  She knocked the scab off this morning.   She has been cleaning it with peroxide  Relevant past medical, surgical, family and social history reviewed and updated as indicated. Interim medical history since our last visit reviewed. Allergies and medications reviewed and updated.   Current Outpatient Prescriptions:  .  bisacodyl (DULCOLAX) 5 MG EC tablet, Take 5 mg by mouth daily as needed for moderate constipation., Disp: , Rfl:  .  CALCIUM-VITAMIN D PO, Take 1 capsule by mouth daily., Disp: , Rfl:  .  desvenlafaxine (PRISTIQ) 50 MG 24 hr tablet, Take 1 tablet (50 mg total) by mouth daily., Disp: 30 tablet, Rfl: 2 .  IRON PO, Take 27 mg by mouth. One every other day, Disp: , Rfl:  .  linaclotide (LINZESS) 290 MCG CAPS capsule, Take 1 capsule (290 mcg total) by mouth daily. (Patient taking differently: Take 290 mcg by mouth at bedtime. ), Disp: 30 capsule, Rfl: 5 .  meclizine (ANTIVERT) 25 MG tablet, Take 1 tablet (25 mg total) by mouth 3 (three) times daily as needed for dizziness., Disp: 30 tablet, Rfl: 0 .  Multiple Vitamins-Minerals (MULTIVITAMIN WITH MINERALS) tablet, Take 1 tablet by mouth daily., Disp: , Rfl:  .  Omega-3 Fatty Acids (FISH OIL) 1000 MG CAPS, Take 1,200 mg by mouth daily., Disp: , Rfl:  .  omeprazole (PRILOSEC) 20 MG capsule, Take 1 capsule (20 mg total) by mouth 2 (two) times daily before a meal., Disp:  180 capsule, Rfl: 3 .  simethicone (MYLICON) 932 MG chewable tablet, Chew 250 mg by mouth every 6 (six) hours as needed for flatulence., Disp: , Rfl:  .  simvastatin (ZOCOR) 20 MG tablet, Take 1 tablet (20 mg total) by mouth at bedtime., Disp: 30 tablet, Rfl: 3 .  ezetimibe-simvastatin (VYTORIN) 10-10 MG tablet, Take 1 tablet by mouth at bedtime. (Patient not taking: Reported on 01/31/2017), Disp: 30 tablet, Rfl: 2 .  levothyroxine (SYNTHROID, LEVOTHROID) 25 MCG tablet, TAKE 1 TABLET BY MOUTH EVERY MORNING BEFORE BREAKFAST, Disp: 30 tablet, Rfl: 4 .  ZETIA 10 MG tablet, Take 1 tablet (10 mg total) by mouth daily. (Patient not taking: Reported on 01/31/2017), Disp: 30 tablet, Rfl: 3   Review of Systems  Constitutional: Negative for appetite change, chills, diaphoresis, fatigue, fever and unexpected weight change.  HENT: Positive for congestion, dental problem, ear pain, sneezing, sore throat and trouble swallowing. Negative for drooling, facial swelling, hearing loss, mouth sores and voice change.   Eyes: Negative for pain, discharge, redness, itching and visual disturbance.  Respiratory: Positive for cough, chest tightness and wheezing. Negative for choking and shortness of breath.   Cardiovascular: Negative for chest pain, palpitations and leg swelling.  Gastrointestinal: Positive for constipation. Negative for abdominal pain,  blood in stool, diarrhea and vomiting.  Endocrine: Positive for cold intolerance and polydipsia. Negative for heat intolerance.  Genitourinary: Negative for decreased urine volume, dysuria and hematuria.  Musculoskeletal: Positive for arthralgias and back pain. Negative for gait problem.  Skin: Negative for rash.  Allergic/Immunologic: Negative for environmental allergies.  Neurological: Negative for seizures, syncope, light-headedness and headaches.  Hematological: Negative for adenopathy.  Psychiatric/Behavioral: Negative for agitation, dysphoric mood and suicidal ideas.  The patient is not nervous/anxious.     Per HPI unless specifically indicated above     Objective:    BP 110/60 (BP Location: Left Arm, Patient Position: Sitting, Cuff Size: Normal)   Pulse 84   Temp 97.7 F (36.5 C)   Wt 141 lb (64 kg)   SpO2 97%   BMI 27.54 kg/m   Wt Readings from Last 3 Encounters:  01/31/17 141 lb (64 kg)  12/19/16 142 lb 8 oz (64.6 kg)  11/21/16 140 lb (63.5 kg)    Physical Exam  Constitutional: She is oriented to person, place, and time. She appears well-developed and well-nourished.  HENT:  Head: Normocephalic and atraumatic.  Right Ear: Hearing, tympanic membrane, external ear and ear canal normal.  Left Ear: Hearing, tympanic membrane, external ear and ear canal normal.  Nose: Nose normal.  Mouth/Throat: Uvula is midline and oropharynx is clear and moist. No oropharyngeal exudate.  Neck: Neck supple.  Cardiovascular: Normal rate and regular rhythm.   Pulmonary/Chest: Effort normal. No respiratory distress. She has wheezes. She has no rales.  Musculoskeletal:       Left hand: She exhibits laceration.  1 cm superficial laceration palmar surface left thumb. Clean. No redness or drainage  Lymphadenopathy:    She has no cervical adenopathy.  Neurological: She is alert and oriented to person, place, and time.  Skin: Skin is warm and dry.  Psychiatric: She has a normal mood and affect. Her behavior is normal.  Vitals reviewed.       Assessment & Plan:   Encounter Diagnoses  Name Primary?  Marland Kitchen COPD exacerbation ([place]) Yes  . Open wound of left thumb, initial encounter   . Cigarette nicotine dependence with nicotine-induced disorder     -Gave rx for Td. She is counseled that she can [facility] and would not need to be seen since she has Rx for the vaccine -pt counseled on wound care- avoid peroxide. Clean with soap and water. Keep it dry (ie avoid wearing wet band-aids) -rx prednisone taper and albuterol mdi for copd  exacerbation -cousneled avoiding smoking -f/u as scheduled. RTO sooner prn

## 2017-02-13 ENCOUNTER — Other Ambulatory Visit: Payer: Self-pay | Admitting: Physician Assistant

## 2017-03-21 ENCOUNTER — Ambulatory Visit: Payer: Self-pay | Admitting: Physician Assistant

## 2017-04-08 ENCOUNTER — Other Ambulatory Visit: Payer: Self-pay | Admitting: Nurse Practitioner

## 2017-04-08 ENCOUNTER — Other Ambulatory Visit: Payer: Self-pay | Admitting: Physician Assistant

## 2017-04-16 ENCOUNTER — Other Ambulatory Visit: Payer: Self-pay | Admitting: Physician Assistant

## 2017-04-16 MED ORDER — DESVENLAFAXINE SUCCINATE ER 50 MG PO TB24: 50.0000 mg | ORAL_TABLET | Freq: Every day | ORAL | 2 refills | Status: DC

## 2017-05-14 ENCOUNTER — Other Ambulatory Visit: Payer: Self-pay | Admitting: Physician Assistant

## 2017-06-08 ENCOUNTER — Ambulatory Visit: Payer: Medicaid Other | Admitting: Family Medicine

## 2017-06-27 ENCOUNTER — Other Ambulatory Visit: Payer: Self-pay | Admitting: Physician Assistant

## 2017-07-11 ENCOUNTER — Other Ambulatory Visit: Payer: Self-pay | Admitting: Physician Assistant

## 2017-07-17 ENCOUNTER — Encounter: Payer: Self-pay | Admitting: Family Medicine

## 2017-07-17 ENCOUNTER — Ambulatory Visit (INDEPENDENT_AMBULATORY_CARE_PROVIDER_SITE_OTHER): Payer: Medicaid Other | Admitting: Family Medicine

## 2017-07-17 VITALS — BP 136/68 | HR 76 | Temp 97.3°F | Resp 16 | Ht 60.0 in | Wt 140.1 lb

## 2017-07-17 DIAGNOSIS — D569 Thalassemia, unspecified: Secondary | ICD-10-CM | POA: Diagnosis not present

## 2017-07-17 DIAGNOSIS — F4323 Adjustment disorder with mixed anxiety and depressed mood: Secondary | ICD-10-CM

## 2017-07-17 DIAGNOSIS — F17210 Nicotine dependence, cigarettes, uncomplicated: Secondary | ICD-10-CM

## 2017-07-17 DIAGNOSIS — E039 Hypothyroidism, unspecified: Secondary | ICD-10-CM

## 2017-07-17 DIAGNOSIS — L920 Granuloma annulare: Secondary | ICD-10-CM | POA: Diagnosis not present

## 2017-07-17 DIAGNOSIS — E785 Hyperlipidemia, unspecified: Secondary | ICD-10-CM | POA: Diagnosis not present

## 2017-07-17 MED ORDER — LEVOTHYROXINE SODIUM 25 MCG PO TABS: ORAL_TABLET | ORAL | 3 refills | Status: DC

## 2017-07-17 MED ORDER — OMEPRAZOLE 20 MG PO CPDR: 20.0000 mg | DELAYED_RELEASE_CAPSULE | Freq: Two times a day (BID) | ORAL | 3 refills | Status: DC

## 2017-07-17 MED ORDER — LINACLOTIDE 290 MCG PO CAPS: 290.0000 ug | ORAL_CAPSULE | Freq: Every day | ORAL | 3 refills | Status: DC

## 2017-07-17 MED ORDER — SIMVASTATIN 20 MG PO TABS: 20.0000 mg | ORAL_TABLET | Freq: Every day | ORAL | 3 refills | Status: DC

## 2017-07-17 NOTE — Patient Instructions (Signed)
Need blood tests I may  Need to adjust your thyroid dose I will send you a letter with your test results.  If there is anything of concern, we will call right away.  Try to cut down on smoking Stay as active as you can manage  See me in 3-4 weeks for a PE

## 2017-07-17 NOTE — Progress Notes (Signed)
Chief Complaint  Patient presents with  . Hyperlipidemia   This is a new patient to the practice. She has hyperlipidemia.  Compliant with taking a statin and fish oil. She has hypothyroidism.  Her last TSH is 5.  She tells me that she is tired and gaining weight.  We will repeat this today and adjust her Synthroid as indicated She has a history of anxiety and depression.  She states she did not tolerate Paxil and her doctor added Pristiq.  She keeps getting both medicines from the pharmacy and questions whether she should be taking them.  She is told to stop the Paxil and just take the other She states she has disability from chronic back pain and arthritis in her hands. She has a rash that she states she has seen dermatology for.  They did a biopsy.  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  She keeps thinking the rash is going to go away and I advised her that sometimes it does not. She has a history of acid reflux, ulcer disease, chronic constipation.  She sees gastroenterology.  She is on omeprazole 20 twice daily and takes Linzess. She has a history of chronic anemia.  She states she has thalassemia.  Her hemoglobin is usually around 10.  She takes iron every other day. I have discussed the multiple health risks associated with cigarette smoking including, but not limited to, cardiovascular disease, lung disease and cancer.  I have strongly recommended that smoking be stopped.  I have reviewed the various methods of quitting including cold [place], classes, nicotine replacements and prescription medications.  I have offered assistance in this difficult process.  The patient is not interested in assistance at this time. Health maintenance is up-to-date.  She had a flu shot this year.  She has had her mammogram.  Colonoscopy 2015.  She is offered a Dietitian  Patient Active Problem List   Diagnosis Date Noted  . Thalassemia 07/17/2017  . 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 Cigarette nicotine dependence without complication 97/67/3419  . Anxiety 12/28/2015  . Chronic pain 12/28/2015  . Depression 12/28/2015  . Adjustment disorder with mixed anxiety and depressed mood 11/07/2015  . PUD (peptic ulcer disease) 07/23/2014  . GERD (gastroesophageal reflux disease) 05/29/2014  . Anemia 05/29/2014  . Hyperlipidemia 01/20/2014  . Hypothyroidism 01/20/2014    Outpatient Encounter Prescriptions as of 07/17/2017  Medication Sig  . albuterol (PROVENTIL HFA;VENTOLIN HFA) 108 (90 Base) MCG/ACT inhaler Inhale 2 puffs into the lungs every 6 (six) hours as needed for wheezing or shortness of breath.  . bisacodyl (DULCOLAX) 5 MG EC tablet Take 5 mg by mouth daily as needed for moderate constipation.  Marland Kitchen CALCIUM-VITAMIN D PO Take 1 capsule by mouth daily.  Marland Kitchen desvenlafaxine (PRISTIQ) 50 MG 24 hr tablet Take 1 tablet (50 mg total) by mouth daily.  . IRON PO Take 27 mg by mouth. One every other day  . levothyroxine (SYNTHROID, LEVOTHROID) 25 MCG tablet TAKE 1 TABLET BY MOUTH EVERY MORNING BEFORE BREAKFAST  . linaclotide (LINZESS) 290 MCG CAPS capsule Take 1 capsule (290 mcg total) by mouth daily.  . meclizine (ANTIVERT) 25 MG tablet Take 1 tablet (25 mg total) by mouth 3 (three) times daily as needed for dizziness.  . Multiple Vitamins-Minerals (MULTIVITAMIN WITH MINERALS) tablet Take 1 tablet by mouth daily.  . Omega-3 Fatty Acids (FISH OIL) 1000 MG CAPS Take 1,200 mg by mouth daily.  Marland Kitchen omeprazole (PRILOSEC)  20 MG capsule Take 1 capsule (20 mg total) by mouth 2 (two) times daily before a meal.  . simvastatin (ZOCOR) 20 MG tablet Take 1 tablet (20 mg total) by mouth at bedtime.   No facility-administered encounter medications on file as of 07/17/2017.     Past Medical History:  Diagnosis Date  . Allergy   . Anemia   . Anxiety   . Arthritis   . Asthma   . Cancer ([place])   . COPD (chronic obstructive pulmonary disease) ([place])   . Depression   . GERD  (gastroesophageal reflux disease)   . Hyperlipidemia   . Hypothyroidism     Past Surgical History:  Procedure Laterality Date  . carpel tunnel release     bilaterally  . CESAREAN SECTION    . COLONOSCOPY N/A 06/22/2014   Dr. Rourk:External and internal hemorrhoids-grade 4; otherwise normal rectum total colon and terminal ileum.  . COLPOSCOPY    . ESOPHAGOGASTRODUODENOSCOPY N/A 06/22/2014   Dr. Gala Romney:The mucosa of the esophagus appeared normal  Single gastric ulcer ranging between 3-5 mm in size was found   Biopsy with negative H.pylori.   . ESOPHAGOGASTRODUODENOSCOPY N/A 10/28/2014   Dr. Gala Romney: patulous EG junction, small hiatal hernia, previous noted gastric ulcer healed  . VAGINAL HYSTERECTOMY     bleeding    Social History   Social History  . Marital status: Married    Spouse name: [name]  . Number of children: 2  . Years of education: 10   Occupational History  . 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      disabled from arthritis   Social History Main Topics  . Smoking status: Current Every Day Smoker    Packs/day: 0.25    Years: 40.00    Start date: 09/25/1973  . Smokeless tobacco: Never Used     Comment: one pack - 4 days  . Alcohol use No  . Drug use: No  . Sexual activity: Not Currently   Other Topics Concern  . Not on file   Social History Narrative   Lives at home with [name]   Visits sister - stays at home    Family History  Problem Relation Age of Onset  . Uterine cancer Mother   . Heart disease Mother   . Cancer Mother   . Alcohol abuse Mother   . Arthritis Mother   . COPD Mother   . Depression Mother   . Heart disease Father        age 27, family questions autopsy  . Alcohol abuse Father   . Arthritis Father   . Depression Father   . Early death Father   . Cancer Other        ulcer, aunt  . Lung cancer Cousin   . Throat cancer Cousin   . Asthma Son   . Colon cancer Neg Hx   . Ulcers Neg Hx     Review of Systems  Constitutional:  Positive for malaise/fatigue. Negative for chills, fever and weight loss.  HENT: Negative for congestion and hearing loss.   Eyes: Negative for blurred vision and pain.  Respiratory: Positive for shortness of breath. Negative for cough.   Cardiovascular: Negative for chest pain and leg swelling.  Gastrointestinal: Positive for constipation. Negative for abdominal pain, diarrhea and heartburn.  Genitourinary: Negative for dysuria and frequency.  Musculoskeletal: Positive for back pain and joint pain. Negative for falls and myalgias.       Arthritis  in hands  Neurological: Negative for dizziness, seizures and headaches.  Psychiatric/Behavioral: Positive for depression. The patient is not nervous/anxious and does not have insomnia.     BP 136/68 (BP Location: Right Arm, Patient Position: Sitting, Cuff Size: Normal)   Pulse 76   Temp (!) 97.3 F (36.3 C) (Temporal)   Resp 16   Ht 5' (1.524 m)   Wt 140 lb 1.9 oz (63.6 kg)   SpO2 97%   BMI 27.37 kg/m   Physical Exam  Constitutional: She is oriented to person, place, and time. She appears well-developed and well-nourished.  HENT:  Head: Normocephalic and atraumatic.  Mouth/Throat: Oropharynx is clear and moist.  Eyes: Pupils are equal, round, and reactive to light. Conjunctivae are normal.  Neck: Normal range of motion. Neck supple. No thyromegaly present.  Cardiovascular: Normal rate, regular rhythm and normal heart sounds.   Pulmonary/Chest: Effort normal and breath sounds normal. No respiratory distress.  Abdominal: Soft. Bowel sounds are normal.  Musculoskeletal: Normal range of motion. She exhibits no edema.  Lymphadenopathy:    She has no cervical adenopathy.  Neurological: She is alert and oriented to person, place, and time.  Gait normal  Skin: Skin is warm and dry. Rash noted.  Plaques, typical granuloma annulare both arms and legs  Psychiatric: She has a normal mood and affect. Her behavior is normal. Thought content  normal.  Nursing note and vitals reviewed.  ASSESSMENT/PLAN:  1. Thalassemia, unspecified type  2. Granuloma annulare  3. Hypothyroidism, unspecified type - CBC - COMPLETE METABOLIC PANEL WITH GFR - Lipid panel - Urinalysis, Routine w reflex microscopic - TSH  4. Hyperlipidemia, unspecified hyperlipidemia type 5.  Depression/anxiety 6.  Nicotine dependence  Greater than 50% of this visit was spent in counseling and coordinating care.  Total face to face time:   45 minutes spent in discussing her medical history, need to quit smoking, granuloma annulare, on lifestyle recommendations for diet and regular exercise   Patient Instructions  Need blood tests I may  Need to adjust your thyroid dose I will send you a letter with your test results.  If there is anything of concern, we will call right away.  Try to cut down on smoking Stay as active as you can manage  See me in 3-4 weeks for a PE     Raylene Everts, MD

## 2017-07-18 ENCOUNTER — Encounter: Payer: Self-pay | Admitting: Family Medicine

## 2017-07-18 LAB — CBC
HCT: 35.1 % (ref 35.0–45.0)
HEMOGLOBIN: 10.9 g/dL — AB (ref 11.7–15.5)
MCH: 20.1 pg — AB (ref 27.0–33.0)
MCHC: 31.1 g/dL — ABNORMAL LOW (ref 32.0–36.0)
MCV: 64.6 fL — AB (ref 80.0–100.0)
MPV: 11.2 fL (ref 7.5–12.5)
Platelets: 298 10*3/uL (ref 140–400)
RBC: 5.43 10*6/uL — AB (ref 3.80–5.10)
RDW: 15.2 % — ABNORMAL HIGH (ref 11.0–15.0)
WBC: 5 10*3/uL (ref 3.8–10.8)

## 2017-07-18 LAB — COMPLETE METABOLIC PANEL WITH GFR
AG Ratio: 1.9 (calc) (ref 1.0–2.5)
ALBUMIN MSPROF: 4.4 g/dL (ref 3.6–5.1)
ALT: 15 U/L (ref 6–29)
AST: 22 U/L (ref 10–35)
Alkaline phosphatase (APISO): 83 U/L (ref 33–130)
BILIRUBIN TOTAL: 0.5 mg/dL (ref 0.2–1.2)
BUN: 17 mg/dL (ref 7–25)
CHLORIDE: 104 mmol/L (ref 98–110)
CO2: 29 mmol/L (ref 20–32)
CREATININE: 0.65 mg/dL (ref 0.50–1.05)
Calcium: 9.5 mg/dL (ref 8.6–10.4)
GFR, EST AFRICAN AMERICAN: 113 mL/min/{1.73_m2} (ref >=60)
GFR, Est Non African American: 98 mL/min/{1.73_m2} (ref >=60)
GLUCOSE: 86 mg/dL (ref 65–139)
Globulin: 2.3 g/dL (calc) (ref 1.9–3.7)
Potassium: 3.8 mmol/L (ref 3.5–5.3)
Sodium: 142 mmol/L (ref 135–146)
TOTAL PROTEIN: 6.7 g/dL (ref 6.1–8.1)

## 2017-07-18 LAB — LIPID PANEL
CHOL/HDL RATIO: 3.7 (calc) (ref <5.0)
Cholesterol: 199 mg/dL (ref <200)
HDL: 54 mg/dL (ref >50)
LDL CHOLESTEROL (CALC): 123 mg/dL — AB
Non-HDL Cholesterol (Calc): 145 mg/dL (calc) — ABNORMAL HIGH (ref <130)
TRIGLYCERIDES: 112 mg/dL (ref <150)

## 2017-07-18 LAB — URINALYSIS, ROUTINE W REFLEX MICROSCOPIC
Bacteria, UA: NONE SEEN /HPF
Bilirubin Urine: NEGATIVE
Glucose, UA: NEGATIVE
HYALINE CAST: NONE SEEN /LPF
Ketones, ur: NEGATIVE
NITRITE: NEGATIVE
PROTEIN: NEGATIVE
SPECIFIC GRAVITY, URINE: 1.015 (ref 1.001–1.03)
WBC, UA: >=60 /HPF — AB (ref 0–5)
pH: 5.5 (ref 5.0–8.0)

## 2017-07-18 LAB — TSH: TSH: 3.56 m[IU]/L (ref 0.40–4.50)

## 2017-07-20 ENCOUNTER — Other Ambulatory Visit: Payer: Self-pay | Admitting: Physician Assistant

## 2017-07-23 ENCOUNTER — Ambulatory Visit: Payer: Self-pay | Admitting: Family Medicine

## 2017-08-14 ENCOUNTER — Encounter: Payer: Self-pay | Admitting: Family Medicine

## 2017-08-14 ENCOUNTER — Ambulatory Visit (INDEPENDENT_AMBULATORY_CARE_PROVIDER_SITE_OTHER): Payer: Medicaid Other | Admitting: Family Medicine

## 2017-08-14 ENCOUNTER — Other Ambulatory Visit: Payer: Self-pay

## 2017-08-14 VITALS — BP 116/58 | HR 76 | Temp 97.2°F | Resp 16 | Ht 60.0 in | Wt 142.0 lb

## 2017-08-14 DIAGNOSIS — D569 Thalassemia, unspecified: Secondary | ICD-10-CM | POA: Diagnosis not present

## 2017-08-14 DIAGNOSIS — R8290 Unspecified abnormal findings in urine: Secondary | ICD-10-CM

## 2017-08-14 DIAGNOSIS — Z0000 Encounter for general adult medical examination without abnormal findings: Secondary | ICD-10-CM | POA: Diagnosis not present

## 2017-08-14 DIAGNOSIS — E039 Hypothyroidism, unspecified: Secondary | ICD-10-CM | POA: Diagnosis not present

## 2017-08-14 DIAGNOSIS — E785 Hyperlipidemia, unspecified: Secondary | ICD-10-CM | POA: Diagnosis not present

## 2017-08-14 DIAGNOSIS — L920 Granuloma annulare: Secondary | ICD-10-CM | POA: Diagnosis not present

## 2017-08-14 DIAGNOSIS — R140 Abdominal distension (gaseous): Secondary | ICD-10-CM | POA: Diagnosis not present

## 2017-08-14 DIAGNOSIS — F17210 Nicotine dependence, cigarettes, uncomplicated: Secondary | ICD-10-CM | POA: Diagnosis not present

## 2017-08-14 LAB — POCT URINALYSIS DIPSTICK
BILIRUBIN UA: NEGATIVE
Glucose, UA: NEGATIVE
NITRITE UA: NEGATIVE
PH UA: 7 (ref 5.0–8.0)
Protein, UA: NEGATIVE
RBC UA: NEGATIVE
Spec Grav, UA: 1.02 (ref 1.010–1.025)
UROBILINOGEN UA: 0.2 U/dL

## 2017-08-14 NOTE — Progress Notes (Signed)
Chief Complaint  Patient presents with  . Annual Exam   Patient is here for physical examination. She is up-to-date with her healthcare screening. Her mammogram was done in 2018, April DEXA scan not yet indicated Colonoscopy up-to-date Immunizations up-to-date Recent laboratory tests are discussed with patient.  Everything is normal or as expected.  Her urinalysis was mildly abnormal.  Plan is to repeat that today. She has mild anemia.  Known thalassemia.  Her hemoglobin was actually better than when last tested. She continues to be upset about her granuloma annulare.  Wants to know if it can be treated.  She states that people "stare" at her. She complains of abdominal bloating and early satiety.  She was previously under the care of gastroenterology.  Has not seen them for 14 months.  I recommend follow-up.  We discussed that sometimes you can get a bacterial overgrowth in the small bowel and the dietary indiscretion is often the key.  I told her to pay attention to what foods she eats when she has symptoms and perhaps keep a diary for the gastroenterologist. Patient Active Problem List   Diagnosis Date Noted  . Thalassemia 07/17/2017  . 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 Cigarette nicotine dependence without complication 46/56/8127  . Anxiety 12/28/2015  . Chronic pain 12/28/2015  . Depression 12/28/2015  . Adjustment disorder with mixed anxiety and depressed mood 11/07/2015  . PUD (peptic ulcer disease) 07/23/2014  . GERD (gastroesophageal reflux disease) 05/29/2014  . Anemia 05/29/2014  . Hyperlipidemia 01/20/2014  . Hypothyroidism 01/20/2014    Outpatient Encounter Medications as of 08/14/2017  Medication Sig  . albuterol (PROVENTIL HFA;VENTOLIN HFA) 108 (90 Base) MCG/ACT inhaler Inhale 2 puffs into the lungs every 6 (six) hours as needed for wheezing or shortness of breath.  . bisacodyl (DULCOLAX) 5 MG EC tablet Take 5 mg by mouth daily as needed for moderate constipation.   Marland Kitchen CALCIUM-VITAMIN D PO Take 1 capsule by mouth daily.  Marland Kitchen desvenlafaxine (PRISTIQ) 50 MG 24 hr tablet Take 1 tablet (50 mg total) by mouth daily.  . IRON PO Take 27 mg by mouth. One every other day  . levothyroxine (SYNTHROID, LEVOTHROID) 25 MCG tablet TAKE 1 TABLET BY MOUTH EVERY MORNING BEFORE BREAKFAST  . linaclotide (LINZESS) 290 MCG CAPS capsule Take 1 capsule (290 mcg total) by mouth daily.  . meclizine (ANTIVERT) 25 MG tablet Take 1 tablet (25 mg total) by mouth 3 (three) times daily as needed for dizziness.  . Multiple Vitamins-Minerals (MULTIVITAMIN WITH MINERALS) tablet Take 1 tablet by mouth daily.  . Omega-3 Fatty Acids (FISH OIL) 1000 MG CAPS Take 1,200 mg by mouth daily.  Marland Kitchen omeprazole (PRILOSEC) 20 MG capsule Take 1 capsule (20 mg total) by mouth 2 (two) times daily before a meal.  . simvastatin (ZOCOR) 20 MG tablet Take 1 tablet (20 mg total) by mouth at bedtime.   No facility-administered encounter medications on file as of 08/14/2017.     Allergies  Allergen Reactions  . Penicillins Hives    hives  . Morphine And Related Itching    itching     Review of Systems  Constitutional: Negative for activity change, appetite change and unexpected weight change.  HENT: Positive for dental problem. Negative for congestion, postnasal drip and rhinorrhea.        Overdue for dentist  Eyes: Positive for visual disturbance. Negative for redness.       Overdue for eye exam  Respiratory: Negative for cough and shortness of  breath.   Cardiovascular: Negative for chest pain, palpitations and leg swelling.  Gastrointestinal: Positive for abdominal distention and abdominal pain. Negative for blood in stool, constipation and diarrhea.  Genitourinary: Negative for difficulty urinating, frequency and vaginal bleeding.  Musculoskeletal: Positive for arthralgias and back pain.  Skin: Positive for rash.  Neurological: Negative for dizziness and headaches.  Psychiatric/Behavioral: Positive  for dysphoric mood. Negative for sleep disturbance. The patient is nervous/anxious.     BP (!) 116/58 (BP Location: Right Arm, Patient Position: Sitting, Cuff Size: Normal)   Pulse 76   Temp (!) 97.2 F (36.2 C) (Temporal)   Resp 16   Ht 5' (1.524 m)   Wt 142 lb 0.6 oz (64.4 kg)   SpO2 98%   BMI 27.74 kg/m   Physical Exam BP (!) 116/58 (BP Location: Right Arm, Patient Position: Sitting, Cuff Size: Normal)   Pulse 76   Temp (!) 97.2 F (36.2 C) (Temporal)   Resp 16   Ht 5' (1.524 m)   Wt 142 lb 0.6 oz (64.4 kg)   SpO2 98%   BMI 27.74 kg/m   General Appearance:    Alert, cooperative, no distress, appears stated age  Head:    Normocephalic, without obvious abnormality, atraumatic  Eyes:    PERRL, conjunctiva/corneas clear, EOM's intact, fundi  Obscured early cataracts, both eyes  Ears:    Normal TM's and external ear canals, both ears  Nose:   Nares normal, septum midline, mucosa normal, no drainage    or sinus tenderness  Throat:   Lips, mucosa, and tongue normal; teeth and gums normal  Neck:   Supple, symmetrical, trachea midline, no adenopathy;    thyroid:  no enlargement/tenderness/nodules; no carotid   bruit   Back:     Symmetric, no curvature, ROM normal, no CVA tenderness, slow but full range of motion  Lungs:     Clear to auscultation bilaterally, respirations unlabored  Chest Wall:    No tenderness or deformity   Heart:    Regular rate and rhythm, S1 and S2 normal, no murmur, rub   or gallop  Breast Exam:    No tenderness, masses, or nipple abnormality  Abdomen:     Soft, non-tender, bowel sounds active all four quadrants,    no masses, no organomegaly, diffuse tenderness in the central abdomen periumbilical.  No guarding or rebound  Extremities:   Extremities normal, atraumatic, no cyanosis or edema  Pulses:   2+ and symmetric all extremities  Skin:   Skin color, texture, turgor normal, no  Lesions, multiple irregular annular lesions consistent with granuloma  present on upper extremities and trunk  Lymph nodes:   Cervical, supraclavicular, and axillary nodes normal  Neurologic:    Normal strength, sensation and reflexes    throughout    ASSESSMENT/PLAN:  1. Granuloma annulare Present.  Evaluated by dermatology and biopsied.  2. Hypothyroidism, unspecified type Controlled. - TSH  3. Hyperlipidemia, unspecified hyperlipidemia type On statin. - Comprehensive metabolic panel - Lipid panel - Urinalysis, Routine w reflex microscopic  4. Thalassemia, unspecified type Mild anemia. - CBC  5. Cigarette nicotine dependence without complication Discussed the importance of quitting.  Patient does not feel able.  She feels "stressed".  6. Abnormal urine finding Leukocytes on urinalysis, persist - POCT Urinalysis Dipstick  7. Functional bloating Complaint of bloating, abdominal pain, dietary intolerance.  Instructions as above - Ambulatory referral to Gastroenterology   Patient Instructions  Try to stop smoking Drink plenty of water I am  referring you back to GI Watch out for foods that do not agree with you  See me in six months Call sooner for problems   Raylene Everts, MD

## 2017-08-14 NOTE — Patient Instructions (Signed)
Try to stop smoking Drink plenty of water I am referring you back to GI Watch out for foods that do not agree with you  See me in six months Call sooner for problems

## 2017-08-22 ENCOUNTER — Telehealth (INDEPENDENT_AMBULATORY_CARE_PROVIDER_SITE_OTHER): Payer: Self-pay | Admitting: Internal Medicine

## 2017-08-22 NOTE — Telephone Encounter (Signed)
err

## 2017-08-22 NOTE — Telephone Encounter (Signed)
She is to follow up with [facility]

## 2017-08-23 ENCOUNTER — Encounter: Payer: Self-pay | Admitting: Internal Medicine

## 2017-10-16 ENCOUNTER — Encounter: Payer: Self-pay | Admitting: Gastroenterology

## 2017-10-16 ENCOUNTER — Ambulatory Visit: Payer: Medicaid Other | Admitting: Gastroenterology

## 2017-10-16 VITALS — BP 102/62 | HR 78 | Temp 97.3°F | Ht 61.0 in | Wt 141.8 lb

## 2017-10-16 DIAGNOSIS — D509 Iron deficiency anemia, unspecified: Secondary | ICD-10-CM | POA: Diagnosis not present

## 2017-10-16 DIAGNOSIS — K219 Gastro-esophageal reflux disease without esophagitis: Secondary | ICD-10-CM

## 2017-10-16 DIAGNOSIS — K5900 Constipation, unspecified: Secondary | ICD-10-CM | POA: Diagnosis not present

## 2017-10-16 MED ORDER — HYDROCORTISONE 2.5 % RE CREA: 1.0000 "application " | TOPICAL_CREAM | Freq: Two times a day (BID) | RECTAL | 1 refills | Status: DC

## 2017-10-16 NOTE — Patient Instructions (Signed)
For constipation: stop Linzess. Let's try Amitiza one gelcap WITH FOOD TO AVOID NAUSEA twice a day. Let me know how this works for you!  I have ordered more blood tests. We may send you to hematology.   I will see you in 3 months or sooner if needed!

## 2017-10-16 NOTE — Progress Notes (Signed)
Referring Provider: Raylene Everts, MD Primary Care Physician:  Raylene Everts, MD Primary GI: Dr. Gala Romney   Chief Complaint  Patient presents with  . Bloated  . Gas    HPI:   Kathleen Erickson is a 57 y.o. female presenting today with a history of constipation and PUD (documented healing Feb 2016 on EGD). Last seen in Aug 2017. She has historically been on Linzess 290 mcg.   Linzess 290 mcg daily. Sometimes watery. About once a month has a good bowel movement. Usually takes it at night. Unproductive. Takes Gas-X for the bloating/gas. Eats a small amount and feels gassy. Prilosec BID. Symptomatic with once a day. Takes iron every other day. Eats liver a lot. No overt GI bleeding. Avoiding NSAIDs, aspirin powders.   Past Medical History:  Diagnosis Date  . Allergy   . Anemia   . Anxiety   . Arthritis   . Asthma   . Cancer ([place])   . COPD (chronic obstructive pulmonary disease) ([place])   . Depression   . GERD (gastroesophageal reflux disease)   . Hyperlipidemia   . Hypothyroidism     Past Surgical History:  Procedure Laterality Date  . carpel tunnel release     bilaterally  . CESAREAN SECTION    . COLONOSCOPY N/A 06/22/2014   Dr. Rourk:External and internal hemorrhoids-grade 4; otherwise normal rectum total colon and terminal ileum.  . COLPOSCOPY    . ESOPHAGOGASTRODUODENOSCOPY N/A 06/22/2014   Dr. Gala Romney:The mucosa of the esophagus appeared normal  Single gastric ulcer ranging between 3-5 mm in size was found   Biopsy with negative H.pylori.   . ESOPHAGOGASTRODUODENOSCOPY N/A 10/28/2014   Dr. Gala Romney: patulous EG junction, small hiatal hernia, previous noted gastric ulcer healed  . VAGINAL HYSTERECTOMY     bleeding    Current Outpatient Medications  Medication Sig Dispense Refill  . albuterol (PROVENTIL HFA;VENTOLIN HFA) 108 (90 Base) MCG/ACT inhaler Inhale 2 puffs into the lungs every 6 (six) hours as needed for wheezing or shortness of breath. 1 Inhaler 1  . bisacodyl  (DULCOLAX) 5 MG EC tablet Take 5 mg by mouth daily as needed for moderate constipation.    Marland Kitchen CALCIUM-VITAMIN D PO Take 1 capsule by mouth daily.    Marland Kitchen desvenlafaxine (PRISTIQ) 50 MG 24 hr tablet Take 1 tablet (50 mg total) by mouth daily. 30 tablet 2  . IRON PO Take 27 mg by mouth. One every other day    . levothyroxine (SYNTHROID, LEVOTHROID) 25 MCG tablet TAKE 1 TABLET BY MOUTH EVERY MORNING BEFORE BREAKFAST 90 tablet 3  . linaclotide (LINZESS) 290 MCG CAPS capsule Take 1 capsule (290 mcg total) by mouth daily. 90 capsule 3  . meclizine (ANTIVERT) 25 MG tablet Take 1 tablet (25 mg total) by mouth 3 (three) times daily as needed for dizziness. 30 tablet 0  . Multiple Vitamins-Minerals (MULTIVITAMIN WITH MINERALS) tablet Take 1 tablet by mouth daily.    . Omega-3 Fatty Acids (FISH OIL) 1000 MG CAPS Take 1,200 mg by mouth daily.    Marland Kitchen omeprazole (PRILOSEC) 20 MG capsule Take 1 capsule (20 mg total) by mouth 2 (two) times daily before a meal. 180 capsule 3  . simvastatin (ZOCOR) 20 MG tablet Take 1 tablet (20 mg total) by mouth at bedtime. 90 tablet 3   No current facility-administered medications for this visit.     Allergies as of 10/16/2017 - Review Complete 10/16/2017  Allergen Reaction Noted  . Penicillins Hives 01/20/2014  .  Morphine and related Itching 01/20/2014    Family History  Problem Relation Age of Onset  . Uterine cancer Mother   . Heart disease Mother   . Cancer Mother   . Alcohol abuse Mother   . Arthritis Mother   . COPD Mother   . Depression Mother   . Heart disease Father        age 49, family questions autopsy  . Alcohol abuse Father   . Arthritis Father   . Depression Father   . Early death Father   . Cancer Other        ulcer, aunt  . Lung cancer Cousin   . Throat cancer Cousin   . Asthma Son   . Colon cancer Neg Hx   . Ulcers Neg Hx     Social History   Socioeconomic History  . Marital status: Married    Spouse name: [name]  . Number of children:  2  . Years of education: 10  . Highest education level: None  Social Needs  . Financial resource strain: None  . Food insecurity - worry: None  . Food insecurity - inability: None  . Transportation needs - medical: None  . Transportation needs - non-medical: None  Occupational History  . Occupation: not working    Comment: cleaned houses    Comment: disabled from arthritis  Tobacco Use  . Smoking status: Current Every Day Smoker    Packs/day: 0.25    Years: 40.00    Pack years: 10.00    Start date: 09/25/1973  . Smokeless tobacco: Never Used  . Tobacco comment: one pack - 4 days  Substance and Sexual Activity  . Alcohol use: No  . Drug use: No  . Sexual activity: Not Currently  Other Topics Concern  . None  Social History Narrative   Lives at home with [name]   Visits sister - stays at home    Review of Systems: Gen: Denies fever, chills, anorexia. Denies fatigue, weakness, weight loss.  CV: Denies chest pain, palpitations, syncope, peripheral edema, and claudication. Resp: Denies dyspnea at rest, cough, wheezing, coughing up blood, and pleurisy. GI: see HPI  Derm: Denies rash, itching, dry skin Psych: Denies depression, anxiety, memory loss, confusion. No homicidal or suicidal ideation.  Heme: Denies bruising, bleeding, and enlarged lymph nodes.  Physical Exam: BP 102/62   Pulse 78   Temp (!) 97.3 F (36.3 C) (Oral)   Ht 5\' 1"  (1.549 m)   Wt 141 lb 12.8 oz (64.3 kg)   BMI 26.79 kg/m  General:   Alert and oriented. No distress noted. Pleasant and cooperative.  Head:  Normocephalic and atraumatic. Eyes:  Conjuctiva clear without scleral icterus. Mouth:  Oral mucosa pink and moist.  Lungs: clear to auscultation bilaterally Cardiac: S1 S2 present without murmurs  Abdomen:  +BS, soft, non-tender and non-distended. No rebound or guarding. No HSM or masses noted. Msk:  Symmetrical without gross deformities. Normal posture. Extremities:  Without edema. Neurologic:   Alert and  oriented x4 Psych:  Alert and cooperative. Normal mood and affect.  Lab Results  Component Value Date   WBC 5.0 07/17/2017   HGB 10.9 (L) 07/17/2017   HCT 35.1 07/17/2017   MCV 64.6 (L) 07/17/2017   PLT 298 07/17/2017

## 2017-10-16 NOTE — Progress Notes (Signed)
cc'd to pcp 

## 2017-10-16 NOTE — Assessment & Plan Note (Signed)
Linzess 290 mcg with loose stool. I don't believe she has ever trialed Amitiza. Amitiza 24 mcg samples provided. Colonoscopy on file from 2015. No concerning lower GI signs/symptoms. 3 month return.

## 2017-10-16 NOTE — Assessment & Plan Note (Addendum)
Chronic. Recheck CBC, add anemia panel.

## 2017-10-16 NOTE — Assessment & Plan Note (Signed)
History of PUD but with resolution on EGD in 2016. Avoiding NSAIDs, aspirin powders. Continue with Prilosec BID, as she becomes symptomatic with once daily dosing.

## 2017-10-17 LAB — CBC WITH DIFFERENTIAL/PLATELET
Basophils Absolute: 39 cells/uL (ref 0–200)
Basophils Relative: 0.7 %
Eosinophils Absolute: 61 cells/uL (ref 15–500)
Eosinophils Relative: 1.1 %
HEMATOCRIT: 36.5 % (ref 35.0–45.0)
Hemoglobin: 11.2 g/dL — ABNORMAL LOW (ref 11.7–15.5)
Lymphs Abs: 1986 cells/uL (ref 850–3900)
MCH: 19.8 pg — AB (ref 27.0–33.0)
MCHC: 30.7 g/dL — AB (ref 32.0–36.0)
MCV: 64.4 fL — AB (ref 80.0–100.0)
MPV: 10.9 fL (ref 7.5–12.5)
Monocytes Relative: 8.9 %
NEUTROS PCT: 53.2 %
Neutro Abs: 2926 cells/uL (ref 1500–7800)
Platelets: 314 10*3/uL (ref 140–400)
RBC: 5.67 10*6/uL — AB (ref 3.80–5.10)
RDW: 15.7 % — AB (ref 11.0–15.0)
TOTAL LYMPHOCYTE: 36.1 %
WBC: 5.5 10*3/uL (ref 3.8–10.8)
WBCMIX: 490 {cells}/uL (ref 200–950)

## 2017-10-17 LAB — VITAMIN B12: Vitamin B-12: 333 pg/mL (ref 200–1100)

## 2017-10-17 LAB — IRON,TIBC AND FERRITIN PANEL
%SAT: 22 % (calc) (ref 11–50)
FERRITIN: 6 ng/mL — AB (ref 10–232)
IRON: 86 ug/dL (ref 45–160)
TIBC: 386 mcg/dL (calc) (ref 250–450)

## 2017-10-17 LAB — FOLATE

## 2017-10-22 NOTE — Progress Notes (Signed)
Let me know if she is willing to pursue.

## 2017-10-22 NOTE — Progress Notes (Signed)
Patient as notable IDA. I don't have a prior ferritin to compare this to, but it is extremely low. Hgb stable. Folate is normal. B12 is on the lower end of normal. Is she taking any B12 or iron? Last procedures (colonoscopy/EGD) several years ago. With documented IDA, would want to offer updated colonoscopy, +/- EGD. May need capsule study to wrap up evaluation.

## 2017-10-24 ENCOUNTER — Telehealth: Payer: Self-pay | Admitting: Internal Medicine

## 2017-10-24 NOTE — Telephone Encounter (Signed)
Spoke with pt, see lab note.

## 2017-10-24 NOTE — Telephone Encounter (Signed)
PATIENT WOULD LIKE TO BE CALLED ABOUT HER LAB RESULTS [phone]

## 2017-10-25 NOTE — Progress Notes (Signed)
May get supplemental B12 over the counter. If oral iron is constipation,we can arrange Feraheme and would need to recheck CBC, iron studies 6-8 weeks after Feraheme. Please arrange colonoscopy +/- EGD with Dr. Gala Romney with [facility], reason: IDA. Return in follow-up thereafter.

## 2017-10-29 ENCOUNTER — Telehealth: Payer: Self-pay

## 2017-10-29 ENCOUNTER — Encounter: Payer: Self-pay | Admitting: *Deleted

## 2017-10-29 ENCOUNTER — Other Ambulatory Visit: Payer: Self-pay | Admitting: *Deleted

## 2017-10-29 DIAGNOSIS — K5900 Constipation, unspecified: Secondary | ICD-10-CM

## 2017-10-29 DIAGNOSIS — D509 Iron deficiency anemia, unspecified: Secondary | ICD-10-CM

## 2017-10-29 MED ORDER — CLENPIQ 10-3.5-12 MG-GM -GM/160ML PO SOLN: 1.0000 | Freq: Once | ORAL | 0 refills | Status: AC

## 2017-10-29 NOTE — Telephone Encounter (Signed)
Pt called to discuss samples given of Amitiza 24 mcg.  Pt took samples and didn't have a bowel movement until the second day when she added a stool softener. Pt feels she didn't have any bloating, nor feel full when Amitiza was taken. Pts bowel movement was also formed. Pt stated when she used Linzess 290 mcg, she experiences diarrhea and bloating. Pt would like to know should she continue to take Amitiza? And if so, is it safe to take with stool softeners, because she feels it only works with a stool softener?

## 2017-10-30 NOTE — Telephone Encounter (Signed)
Pt would like rx of Amitiza 38mcg sent into [facility] [place].

## 2017-10-30 NOTE — Telephone Encounter (Signed)
Routing to [facility] refill 

## 2017-10-30 NOTE — Telephone Encounter (Signed)
If Amitiza is producing productive bowel movements and without diarrhea or cramping, I would continue this BID with food and add stool softener as needed. She is welcome to go back to Linzess, but it seems she had issues with the loose stool. We could try Linzess 145 mcg, but I am not sure this will be effective.

## 2017-11-01 MED ORDER — LUBIPROSTONE 24 MCG PO CAPS
24.0000 ug | ORAL_CAPSULE | Freq: Two times a day (BID) | ORAL | 3 refills | Status: DC
Start: 2017-11-01 — End: 2019-07-10

## 2017-11-01 NOTE — Telephone Encounter (Signed)
Pt.notified

## 2017-11-01 NOTE — Telephone Encounter (Signed)
Please tell the patient the prescription was sent to her pharmacy in [place] as requested.

## 2017-11-01 NOTE — Addendum Note (Signed)
Addended by: Gordy Levan, [place] Strieter A on: 11/01/2017 03:18 PM   Modules accepted: Orders

## 2017-11-11 ENCOUNTER — Other Ambulatory Visit: Payer: Self-pay | Admitting: Physician Assistant

## 2017-11-20 ENCOUNTER — Inpatient Hospital Stay (HOSPITAL_COMMUNITY): Admission: RE | Admit: 2017-11-20 | Payer: Medicaid Other | Source: Ambulatory Visit

## 2017-11-20 NOTE — Patient Instructions (Signed)
JNAYA BUTRICK  [birth year]     @PREFPERIOPPHARMACY @   Your procedure is scheduled on  11/26/2017.  Report to [facility] at  715   A.M.  Call this number if you have problems the morning of surgery:  [phone]   Remember:  Do not eat food or drink liquids after midnight.  Take these medicines the morning of surgery with A SIP OF WATER  Pristiq, levothyroxine, antivert, prilosec.   Do not wear jewelry, make-up or nail polish.  Do not wear lotions, powders, or perfumes, or deodorant.  Do not shave 48 hours prior to surgery.  Men may shave face and neck.  Do not bring valuables to the hospital.  [facility] is not responsible for any belongings or valuables.  Contacts, dentures or bridgework may not be worn into surgery.  Leave your suitcase in the car.  After surgery it may be brought to your room.  For patients admitted to the hospital, discharge time will be determined by your treatment team.  Patients discharged the day of surgery will not be allowed to drive home.   Name and phone number of your driver:   family Special instructions:  Follow the diet and prep instructions given to you by Dr Roseanne Kaufman office.  Please read over the following fact sheets that you were given. Anesthesia Post-op Instructions and Care and Recovery After Surgery       Esophagogastroduodenoscopy Esophagogastroduodenoscopy (EGD) is a procedure to examine the lining of the esophagus, stomach, and first part of the small intestine (duodenum). This procedure is done to check for problems such as inflammation, bleeding, ulcers, or growths. During this procedure, a long, flexible, lighted tube with a camera attached (endoscope) is inserted down the throat. Tell a health care provider about:  Any allergies you have.  All medicines you are taking, including vitamins, herbs, eye drops, creams, and over-the-counter medicines.  Any problems you or family members have had with  anesthetic medicines.  Any blood disorders you have.  Any surgeries you have had.  Any medical conditions you have.  Whether you are pregnant or may be pregnant. What are the risks? Generally, this is a safe procedure. However, problems may occur, including:  Infection.  Bleeding.  A tear (perforation) in the esophagus, stomach, or duodenum.  Trouble breathing.  Excessive sweating.  Spasms of the larynx.  A slowed heartbeat.  Low blood pressure.  What happens before the procedure?  Follow instructions from your health care provider about eating or drinking restrictions.  Ask your health care provider about: ? Changing or stopping your regular medicines. This is especially important if you are taking diabetes medicines or blood thinners. ? Taking medicines such as aspirin and ibuprofen. These medicines can thin your blood. Do not take these medicines before your procedure if your health care provider instructs you not to.  Plan to have someone take you home after the procedure.  If you wear dentures, be ready to remove them before the procedure. What happens during the procedure?  To reduce your risk of infection, your health care team will wash or sanitize their hands.  An IV tube will be put in a vein in your hand or arm. You will get medicines and fluids through this tube.  You will be given one or more of the following: ? A medicine to help you relax (sedative). ? A medicine to numb the area (local  anesthetic). This medicine may be sprayed into your throat. It will make you feel more comfortable and keep you from gagging or coughing during the procedure. ? A medicine for pain.  A mouth guard may be placed in your mouth to protect your teeth and to keep you from biting on the endoscope.  You will be asked to lie on your left side.  The endoscope will be lowered down your throat into your esophagus, stomach, and duodenum.  Air will be put into the endoscope.  This will help your health care provider see better.  The lining of your esophagus, stomach, and duodenum will be examined.  Your health care provider may: ? Take a tissue sample so it can be looked at in a lab (biopsy). ? Remove growths. ? Remove objects (foreign bodies) that are stuck. ? Treat any bleeding with medicines or other devices that stop tissue from bleeding. ? Widen (dilate) or stretch narrowed areas of your esophagus and stomach.  The endoscope will be taken out. The procedure may vary among health care providers and hospitals. What happens after the procedure?  Your blood pressure, heart rate, breathing rate, and blood oxygen level will be monitored often until the medicines you were given have worn off.  Do not eat or drink anything until the numbing medicine has worn off and your gag reflex has returned. This information is not intended to replace advice given to you by your health care provider. Make sure you discuss any questions you have with your health care provider. Document Released: 01/12/2005 Document Revised: 02/17/2016 Document Reviewed: 08/05/2015 Elsevier Interactive Patient Education  2018 [employer]. Esophagogastroduodenoscopy, Care After Refer to this sheet in the next few weeks. These instructions provide you with information about caring for yourself after your procedure. Your health care provider may also give you more specific instructions. Your treatment has been planned according to current medical practices, but problems sometimes occur. Call your health care provider if you have any problems or questions after your procedure. What can I expect after the procedure? After the procedure, it is common to have:  A sore throat.  Nausea.  Bloating.  Dizziness.  Fatigue.  Follow these instructions at home:  Do not eat or drink anything until the numbing medicine (local anesthetic) has worn off and your gag reflex has returned. You will know  that the local anesthetic has worn off when you can swallow comfortably.  Do not drive for 24 hours if you received a medicine to help you relax (sedative).  If your health care provider took a tissue sample for testing during the procedure, make sure to get your test results. This is your responsibility. Ask your health care provider or the department performing the test when your results will be ready.  Keep all follow-up visits as told by your health care provider. This is important. Contact a health care provider if:  You cannot stop coughing.  You are not urinating.  You are urinating less than usual. Get help right away if:  You have trouble swallowing.  You cannot eat or drink.  You have throat or chest pain that gets worse.  You are dizzy or light-headed.  You faint.  You have nausea or vomiting.  You have chills.  You have a fever.  You have severe abdominal pain.  You have black, tarry, or bloody stools. This information is not intended to replace advice given to you by your health care provider. Make sure you discuss any  questions you have with your health care provider. Document Released: 08/28/2012 Document Revised: 02/17/2016 Document Reviewed: 08/05/2015 Elsevier Interactive Patient Education  2018 [employer].  Colonoscopy, Adult A colonoscopy is an exam to look at the large intestine. It is done to check for problems, such as:  Lumps (tumors).  Growths (polyps).  Swelling (inflammation).  Bleeding.  What happens before the procedure? Eating and drinking Follow instructions from your doctor about eating and drinking. These instructions may include:  A few days before the procedure - follow a low-fiber diet. ? Avoid nuts. ? Avoid seeds. ? Avoid dried fruit. ? Avoid raw fruits. ? Avoid vegetables.  1-3 days before the procedure - follow a clear liquid diet. Avoid liquids that have red or purple dye. Drink only clear liquids, such  as: ? Clear broth or bouillon. ? Black coffee or tea. ? Clear juice. ? Clear soft drinks or sports drinks. ? Gelatin dessert. ? Popsicles.  On the day of the procedure - do not eat or drink anything during the 2 hours before the procedure.  Bowel prep If you were prescribed an oral bowel prep:  Take it as told by your doctor. Starting the day before your procedure, you will need to drink a lot of liquid. The liquid will cause you to poop (have bowel movements) until your poop is almost clear or light green.  If your skin or butt gets irritated from diarrhea, you may: ? Wipe the area with wipes that have medicine in them, such as adult wet wipes with aloe and vitamin E. ? Put something on your skin that soothes the area, such as petroleum jelly.  If you throw up (vomit) while drinking the bowel prep, take a break for up to 60 minutes. Then begin the bowel prep again. If you keep throwing up and you cannot take the bowel prep without throwing up, call your doctor.  General instructions  Ask your doctor about changing or stopping your normal medicines. This is important if you take diabetes medicines or blood thinners.  Plan to have someone take you home from the hospital or clinic. What happens during the procedure?  An IV tube may be put into one of your veins.  You will be given medicine to help you relax (sedative).  To reduce your risk of infection: ? Your doctors will wash their hands. ? Your anal area will be washed with soap.  You will be asked to lie on your side with your knees bent.  Your doctor will get a long, thin, flexible tube ready. The tube will have a camera and a light on the end.  The tube will be put into your anus.  The tube will be gently put into your large intestine.  Air will be delivered into your large intestine to keep it open. You may feel some pressure or cramping.  The camera will be used to take photos.  A small tissue sample may be  removed from your body to be looked at under a microscope (biopsy). If any possible problems are found, the tissue will be sent to a lab for testing.  If small growths are found, your doctor may remove them and have them checked for cancer.  The tube that was put into your anus will be slowly removed. The procedure may vary among doctors and hospitals. What happens after the procedure?  Your doctor will check on you often until the medicines you were given have worn off.  Do not  drive for 24 hours after the procedure.  You may have a small amount of blood in your poop.  You may pass gas.  You may have mild cramps or bloating in your belly (abdomen).  It is up to you to get the results of your procedure. Ask your doctor, or the department performing the procedure, when your results will be ready. This information is not intended to replace advice given to you by your health care provider. Make sure you discuss any questions you have with your health care provider. Document Released: 10/14/2010 Document Revised: 07/12/2016 Document Reviewed: 11/23/2015 Elsevier Interactive Patient Education  2017 [employer]  Colonoscopy, Adult, Care After This sheet gives you information about how to care for yourself after your procedure. Your health care provider may also give you more specific instructions. If you have problems or questions, contact your health care provider. What can I expect after the procedure? After the procedure, it is common to have:  A small amount of blood in your stool for 24 hours after the procedure.  Some gas.  Mild abdominal cramping or bloating.  Follow these instructions at home: General instructions   For the first 24 hours after the procedure: ? Do not drive or use machinery. ? Do not sign important documents. ? Do not drink alcohol. ? Do your regular daily activities at a slower pace than normal. ? Eat soft, easy-to-digest foods. ? Rest  often.  Take over-the-counter or prescription medicines only as told by your health care provider.  It is up to you to get the results of your procedure. Ask your health care provider, or the department performing the procedure, when your results will be ready. Relieving cramping and bloating  Try walking around when you have cramps or feel bloated.  Apply heat to your abdomen as told by your health care provider. Use a heat source that your health care provider recommends, such as a moist heat pack or a heating pad. ? Place a towel between your skin and the heat source. ? Leave the heat on for 20-30 minutes. ? Remove the heat if your skin turns bright red. This is especially important if you are unable to feel pain, heat, or cold. You may have a greater risk of getting burned. Eating and drinking  Drink enough fluid to keep your urine clear or pale yellow.  Resume your normal diet as instructed by your health care provider. Avoid heavy or fried foods that are hard to digest.  Avoid drinking alcohol for as long as instructed by your health care provider. Contact a health care provider if:  You have blood in your stool 2-3 days after the procedure. Get help right away if:  You have more than a small spotting of blood in your stool.  You pass large blood clots in your stool.  Your abdomen is swollen.  You have nausea or vomiting.  You have a fever.  You have increasing abdominal pain that is not relieved with medicine. This information is not intended to replace advice given to you by your health care provider. Make sure you discuss any questions you have with your health care provider. Document Released: 04/25/2004 Document Revised: 06/05/2016 Document Reviewed: 11/23/2015 Elsevier Interactive Patient Education  2018 [place] Anesthesia is a term that refers to techniques, procedures, and medicines that help a person stay safe and comfortable  during a medical procedure. Monitored anesthesia care, or sedation, is one type of anesthesia. Your  anesthesia specialist may recommend sedation if you will be having a procedure that does not require you to be unconscious, such as:  Cataract surgery.  A dental procedure.  A biopsy.  A colonoscopy.  During the procedure, you may receive a medicine to help you relax (sedative). There are three levels of sedation:  Mild sedation. At this level, you may feel awake and relaxed. You will be able to follow directions.  Moderate sedation. At this level, you will be sleepy. You may not remember the procedure.  Deep sedation. At this level, you will be asleep. You will not remember the procedure.  The more medicine you are given, the deeper your level of sedation will be. Depending on how you respond to the procedure, the anesthesia specialist may change your level of sedation or the type of anesthesia to fit your needs. An anesthesia specialist will monitor you closely during the procedure. Let your health care provider know about:  Any allergies you have.  All medicines you are taking, including vitamins, herbs, eye drops, creams, and over-the-counter medicines.  Any use of steroids (by mouth or as a cream).  Any problems you or family members have had with sedatives and anesthetic medicines.  Any blood disorders you have.  Any surgeries you have had.  Any medical conditions you have, such as sleep apnea.  Whether you are pregnant or may be pregnant.  Any use of cigarettes, alcohol, or street drugs. What are the risks? Generally, this is a safe procedure. However, problems may occur, including:  Getting too much medicine (oversedation).  Nausea.  Allergic reaction to medicines.  Trouble breathing. If this happens, a breathing tube may be used to help with breathing. It will be removed when you are awake and breathing on your own.  Heart trouble.  Lung trouble.  Before  the procedure Staying hydrated Follow instructions from your health care provider about hydration, which may include:  Up to 2 hours before the procedure - you may continue to drink clear liquids, such as water, clear fruit juice, black coffee, and plain tea.  Eating and drinking restrictions Follow instructions from your health care provider about eating and drinking, which may include:  8 hours before the procedure - stop eating heavy meals or foods such as meat, fried foods, or fatty foods.  6 hours before the procedure - stop eating light meals or foods, such as toast or cereal.  6 hours before the procedure - stop drinking milk or drinks that contain milk.  2 hours before the procedure - stop drinking clear liquids.  Medicines Ask your health care provider about:  Changing or stopping your regular medicines. This is especially important if you are taking diabetes medicines or blood thinners.  Taking medicines such as aspirin and ibuprofen. These medicines can thin your blood. Do not take these medicines before your procedure if your health care provider instructs you not to.  Tests and exams  You will have a physical exam.  You may have blood tests done to show: ? How well your kidneys and liver are working. ? How well your blood can clot.  General instructions  Plan to have someone take you home from the hospital or clinic.  If you will be going home right after the procedure, plan to have someone with you for 24 hours.  What happens during the procedure?  Your blood pressure, heart rate, breathing, level of pain and overall condition will be monitored.  An IV tube will  be inserted into one of your veins.  Your anesthesia specialist will give you medicines as needed to keep you comfortable during the procedure. This may mean changing the level of sedation.  The procedure will be performed. After the procedure  Your blood pressure, heart rate, breathing rate, and  blood oxygen level will be monitored until the medicines you were given have worn off.  Do not drive for 24 hours if you received a sedative.  You may: ? Feel sleepy, clumsy, or nauseous. ? Feel forgetful about what happened after the procedure. ? Have a sore throat if you had a breathing tube during the procedure. ? Vomit. This information is not intended to replace advice given to you by your health care provider. Make sure you discuss any questions you have with your health care provider. Document Released: 06/07/2005 Document Revised: 02/18/2016 Document Reviewed: 01/02/2016 Elsevier Interactive Patient Education  2018 [place], Care After These instructions provide you with information about caring for yourself after your procedure. Your health care provider may also give you more specific instructions. Your treatment has been planned according to current medical practices, but problems sometimes occur. Call your health care provider if you have any problems or questions after your procedure. What can I expect after the procedure? After your procedure, it is common to:  Feel sleepy for several hours.  Feel clumsy and have poor balance for several hours.  Feel forgetful about what happened after the procedure.  Have poor judgment for several hours.  Feel nauseous or vomit.  Have a sore throat if you had a breathing tube during the procedure.  Follow these instructions at home: For at least 24 hours after the procedure:   Do not: ? Participate in activities in which you could fall or become injured. ? Drive. ? Use heavy machinery. ? Drink alcohol. ? Take sleeping pills or medicines that cause drowsiness. ? Make important decisions or sign legal documents. ? Take care of children on your own.  Rest. Eating and drinking  Follow the diet that is recommended by your health care provider.  If you vomit, drink water, juice, or soup when you  can drink without vomiting.  Make sure you have little or no nausea before eating solid foods. General instructions  Have a responsible adult stay with you until you are awake and alert.  Take over-the-counter and prescription medicines only as told by your health care provider.  If you smoke, do not smoke without supervision.  Keep all follow-up visits as told by your health care provider. This is important. Contact a health care provider if:  You keep feeling nauseous or you keep vomiting.  You feel light-headed.  You develop a rash.  You have a fever. Get help right away if:  You have trouble breathing. This information is not intended to replace advice given to you by your health care provider. Make sure you discuss any questions you have with your health care provider. Document Released: 01/02/2016 Document Revised: 05/03/2016 Document Reviewed: 01/02/2016 Elsevier Interactive Patient Education  [employer].

## 2017-11-22 ENCOUNTER — Other Ambulatory Visit: Payer: Self-pay

## 2017-11-22 ENCOUNTER — Encounter (HOSPITAL_COMMUNITY): Payer: Self-pay

## 2017-11-22 ENCOUNTER — Encounter: Payer: Self-pay | Admitting: Family Medicine

## 2017-11-22 ENCOUNTER — Ambulatory Visit (INDEPENDENT_AMBULATORY_CARE_PROVIDER_SITE_OTHER): Payer: Medicaid Other | Admitting: Family Medicine

## 2017-11-22 ENCOUNTER — Other Ambulatory Visit: Payer: Self-pay | Admitting: Physician Assistant

## 2017-11-22 ENCOUNTER — Encounter (HOSPITAL_COMMUNITY)
Admission: RE | Admit: 2017-11-22 | Discharge: 2017-11-22 | Disposition: A | Discharge status: Home / Self Care | Payer: Medicaid Other | Source: Ambulatory Visit | Attending: Internal Medicine | Admitting: Internal Medicine

## 2017-11-22 VITALS — BP 118/64 | HR 68 | Temp 98.5°F | Resp 18 | Ht 61.0 in | Wt 145.0 lb

## 2017-11-22 DIAGNOSIS — J0100 Acute maxillary sinusitis, unspecified: Secondary | ICD-10-CM | POA: Diagnosis not present

## 2017-11-22 DIAGNOSIS — Z01810 Encounter for preprocedural cardiovascular examination: Secondary | ICD-10-CM | POA: Insufficient documentation

## 2017-11-22 DIAGNOSIS — I4510 Unspecified right bundle-branch block: Secondary | ICD-10-CM | POA: Diagnosis not present

## 2017-11-22 DIAGNOSIS — R059 Cough, unspecified: Secondary | ICD-10-CM

## 2017-11-22 DIAGNOSIS — R05 Cough: Secondary | ICD-10-CM | POA: Diagnosis not present

## 2017-11-22 DIAGNOSIS — Z01812 Encounter for preprocedural laboratory examination: Secondary | ICD-10-CM | POA: Diagnosis present

## 2017-11-22 HISTORY — DX: Dyspnea, unspecified: R06.00

## 2017-11-22 LAB — BASIC METABOLIC PANEL
Anion gap: 12 (ref 5–15)
BUN: 17 mg/dL (ref 6–20)
CHLORIDE: 106 mmol/L (ref 101–111)
CO2: 23 mmol/L (ref 22–32)
CREATININE: 0.55 mg/dL (ref 0.44–1.00)
Calcium: 9.1 mg/dL (ref 8.9–10.3)
GFR calc non Af Amer: >60 mL/min (ref >60)
Glucose, Bld: 94 mg/dL (ref 65–99)
Potassium: 3.5 mmol/L (ref 3.5–5.1)
SODIUM: 141 mmol/L (ref 135–145)

## 2017-11-22 MED ORDER — AZITHROMYCIN 250 MG PO TABS: ORAL_TABLET | ORAL | 0 refills | Status: DC

## 2017-11-22 MED ORDER — BENZONATATE 200 MG PO CAPS: 200.0000 mg | ORAL_CAPSULE | Freq: Two times a day (BID) | ORAL | 0 refills | Status: DC | PRN

## 2017-11-22 NOTE — Progress Notes (Signed)
Chief Complaint  Patient presents with  . URI    x 2 weeks   Patient's had a cough for 2 weeks.  She has chest congestion and is coughing up green sputum.  She also has some sinus congestion and drainage.  Postnasal drip.  Sore throat.  She has had fevers and chills.  These have been persistent recurring off and on.  No body aches.  Her chest "burns" with coughing.  No nausea vomiting or diarrhea.  She has been exposed to influenza 2-3 weeks ago.  No other known exposure.  She states she has a history of pneumonia in the past.  Patient Active Problem List   Diagnosis Date Noted  . Constipation 10/16/2017  . Thalassemia 07/17/2017  . 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 Cigarette nicotine dependence without complication 73/42/8768  . Anxiety 12/28/2015  . Chronic pain 12/28/2015  . Depression 12/28/2015  . Adjustment disorder with mixed anxiety and depressed mood 11/07/2015  . PUD (peptic ulcer disease) 07/23/2014  . GERD (gastroesophageal reflux disease) 05/29/2014  . Microcytic anemia 05/29/2014  . Hyperlipidemia 01/20/2014  . Hypothyroidism 01/20/2014    Outpatient Encounter Medications as of 11/22/2017  Medication Sig  . albuterol (PROVENTIL HFA;VENTOLIN HFA) 108 (90 Base) MCG/ACT inhaler Inhale 2 puffs into the lungs every 6 (six) hours as needed for wheezing or shortness of breath.  . bisacodyl (DULCOLAX) 5 MG EC tablet Take 5 mg by mouth daily as needed for moderate constipation.  . calcium carbonate (OSCAL) 1500 (600 Ca) MG TABS tablet Take 1,500 mg by mouth daily with breakfast.  . desvenlafaxine (PRISTIQ) 50 MG 24 hr tablet Take 1 tablet (50 mg total) by mouth daily.  Marland Kitchen FISH OIL-VITAMIN D PO Take 1 tablet by mouth daily.  . hydrocortisone (ANUSOL-HC) 2.5 % rectal cream Place 1 application rectally 2 (two) times daily.  Marland Kitchen ibuprofen (ADVIL,MOTRIN) 200 MG tablet Take 400 mg by mouth every 8 (eight) hours as needed for headache.  . IRON PO Take 27 mg by mouth 3 (three) times a  week.   . levothyroxine (SYNTHROID, LEVOTHROID) 25 MCG tablet TAKE 1 TABLET BY MOUTH EVERY MORNING BEFORE BREAKFAST  . lubiprostone (AMITIZA) 24 MCG capsule Take 1 capsule (24 mcg total) by mouth 2 (two) times daily with a meal. Take on a full stomach  . meclizine (ANTIVERT) 25 MG tablet Take 1 tablet (25 mg total) by mouth 3 (three) times daily as needed for dizziness.  . Multiple Vitamins-Minerals (MULTIVITAMIN WITH MINERALS) tablet Take 1 tablet by mouth daily.  Marland Kitchen omeprazole (PRILOSEC) 20 MG capsule Take 1 capsule (20 mg total) by mouth 2 (two) times daily before a meal.  . simvastatin (ZOCOR) 20 MG tablet Take 1 tablet (20 mg total) by mouth at bedtime.  . vitamin B-12 (CYANOCOBALAMIN) 500 MCG tablet Take 500 mcg by mouth daily.  Marland Kitchen azithromycin (ZITHROMAX) 250 MG tablet tad  . benzonatate (TESSALON) 200 MG capsule Take 1 capsule (200 mg total) by mouth 2 (two) times daily as needed for cough.  Marland Kitchen CLENPIQ 10-3.5-12 MG-GM -GM/160ML SOLN TAKE 1 KIT BY MOUTH ONCE FOR 1 DOSE.  . [DISCONTINUED] linaclotide (LINZESS) 290 MCG CAPS capsule Take 1 capsule (290 mcg total) by mouth daily. (Patient not taking: Reported on 11/22/2017)   No facility-administered encounter medications on file as of 11/22/2017.     Allergies  Allergen Reactions  . Penicillins Hives    Has patient had a PCN reaction causing immediate rash, facial/tongue/throat swelling, SOB or lightheadedness with  hypotension: No Has patient had a PCN reaction causing severe rash involving mucus membranes or skin necrosis: Yes Has patient had a PCN reaction that required hospitalization: No Has patient had a PCN reaction occurring within the last 10 years: No If all of the above answers are "NO", then may proceed with Cephalosporin use.   Marland Kitchen Morphine And Related Itching          Review of Systems  Constitutional: Positive for chills and fatigue. Negative for activity change, appetite change and unexpected weight change.  HENT: Positive  for postnasal drip, rhinorrhea, sinus pressure, sinus pain and sore throat. Negative for congestion and dental problem.   Eyes: Negative for redness and visual disturbance.  Respiratory: Positive for cough. Negative for shortness of breath and wheezing.   Cardiovascular: Positive for chest pain. Negative for palpitations and leg swelling.  Gastrointestinal: Negative for diarrhea, nausea and vomiting.  Genitourinary: Negative for difficulty urinating and frequency.  Musculoskeletal: Negative for myalgias.  Neurological: Positive for headaches. Negative for dizziness.  Psychiatric/Behavioral: Positive for sleep disturbance. Negative for dysphoric mood. The patient is not nervous/anxious.     BP 118/64 (BP Location: Left Arm, Patient Position: Sitting, Cuff Size: Normal)   Pulse 68   Temp 98.5 F (36.9 C) (Oral)   Resp 18   Ht _0  (1.549 m)   Wt 145 lb (65.8 kg)   SpO2 98%   BMI 27.40 kg/m   Physical Exam  Constitutional: She is oriented to person, place, and time. She appears well-developed and well-nourished. She appears distressed.  Frequent harsh cough.  Appears tired but appears mildly ill  HENT:  Head: Normocephalic and atraumatic.  Right Ear: External ear normal.  Left Ear: External ear normal.  Nose: Nose normal.  Mouth/Throat: Oropharynx is clear and moist.  Moderate maxillary sinus tenderness  Eyes: Conjunctivae are normal. Pupils are equal, round, and reactive to light.  Neck: Normal range of motion.  Cardiovascular: Normal rate, regular rhythm and normal heart sounds.  Pulmonary/Chest: Effort normal and breath sounds normal. She has no wheezes. She has no rales.  Abdominal: Soft. Bowel sounds are normal. There is no tenderness.  Lymphadenopathy:    She has cervical adenopathy.  Neurological: She is alert and oriented to person, place, and time.  Psychiatric: She has a normal mood and affect. Her behavior is normal.    ASSESSMENT/PLAN:  1. Cough in adult Likely  started off as a virus.  At this point and spent 2 weeks.  She is a smoker.  History of pneumonia.  I am going to cover her with an antibiotic.  2. Acute non-recurrent maxillary sinusitis Discussed symptomatic care   Patient Instructions  Rest Push fluids May use a saline nasal spray May use flonase for the congestion Take tessalon for the cough Take the z pack Call if not improved ina week   Raylene Everts, MD

## 2017-11-22 NOTE — Patient Instructions (Signed)
Rest Push fluids May use a saline nasal spray May use flonase for the congestion Take tessalon for the cough Take the z pack Call if not improved ina week

## 2017-11-22 NOTE — Pre-Procedure Instructions (Signed)
Copy of letter from office with prep instructions given to patient, as she stated she did not have a copy.

## 2017-11-26 ENCOUNTER — Ambulatory Visit (HOSPITAL_COMMUNITY)
Admission: RE | Admit: 2017-11-26 | Discharge: 2017-11-26 | Disposition: A | Discharge status: Home / Self Care | Payer: Medicaid Other | Source: Ambulatory Visit | Attending: Internal Medicine | Admitting: Internal Medicine

## 2017-11-26 ENCOUNTER — Other Ambulatory Visit: Payer: Self-pay | Admitting: Physician Assistant

## 2017-11-26 ENCOUNTER — Ambulatory Visit (HOSPITAL_COMMUNITY): Payer: Medicaid Other | Admitting: Anesthesiology

## 2017-11-26 ENCOUNTER — Encounter (HOSPITAL_COMMUNITY)
Admission: RE | Disposition: A | Discharge status: Home / Self Care | Payer: Self-pay | Source: Ambulatory Visit | Attending: Internal Medicine

## 2017-11-26 ENCOUNTER — Encounter (HOSPITAL_COMMUNITY): Payer: Self-pay | Admitting: Emergency Medicine

## 2017-11-26 DIAGNOSIS — Z79890 Hormone replacement therapy (postmenopausal): Secondary | ICD-10-CM | POA: Insufficient documentation

## 2017-11-26 DIAGNOSIS — F329 Major depressive disorder, single episode, unspecified: Secondary | ICD-10-CM | POA: Insufficient documentation

## 2017-11-26 DIAGNOSIS — K219 Gastro-esophageal reflux disease without esophagitis: Secondary | ICD-10-CM | POA: Insufficient documentation

## 2017-11-26 DIAGNOSIS — K644 Residual hemorrhoidal skin tags: Secondary | ICD-10-CM | POA: Diagnosis not present

## 2017-11-26 DIAGNOSIS — Z8711 Personal history of peptic ulcer disease: Secondary | ICD-10-CM | POA: Insufficient documentation

## 2017-11-26 DIAGNOSIS — J449 Chronic obstructive pulmonary disease, unspecified: Secondary | ICD-10-CM | POA: Insufficient documentation

## 2017-11-26 DIAGNOSIS — F17210 Nicotine dependence, cigarettes, uncomplicated: Secondary | ICD-10-CM | POA: Diagnosis not present

## 2017-11-26 DIAGNOSIS — Z8541 Personal history of malignant neoplasm of cervix uteri: Secondary | ICD-10-CM | POA: Insufficient documentation

## 2017-11-26 DIAGNOSIS — D569 Thalassemia, unspecified: Secondary | ICD-10-CM | POA: Diagnosis not present

## 2017-11-26 DIAGNOSIS — K643 Fourth degree hemorrhoids: Secondary | ICD-10-CM | POA: Diagnosis not present

## 2017-11-26 DIAGNOSIS — F419 Anxiety disorder, unspecified: Secondary | ICD-10-CM | POA: Insufficient documentation

## 2017-11-26 DIAGNOSIS — D509 Iron deficiency anemia, unspecified: Secondary | ICD-10-CM | POA: Diagnosis present

## 2017-11-26 DIAGNOSIS — E039 Hypothyroidism, unspecified: Secondary | ICD-10-CM | POA: Insufficient documentation

## 2017-11-26 DIAGNOSIS — Z885 Allergy status to narcotic agent status: Secondary | ICD-10-CM | POA: Diagnosis not present

## 2017-11-26 DIAGNOSIS — E785 Hyperlipidemia, unspecified: Secondary | ICD-10-CM | POA: Diagnosis not present

## 2017-11-26 DIAGNOSIS — Z79899 Other long term (current) drug therapy: Secondary | ICD-10-CM | POA: Diagnosis not present

## 2017-11-26 DIAGNOSIS — Z880 Allergy status to penicillin: Secondary | ICD-10-CM | POA: Diagnosis not present

## 2017-11-26 HISTORY — PX: COLONOSCOPY WITH PROPOFOL: SHX5780

## 2017-11-26 HISTORY — PX: ESOPHAGOGASTRODUODENOSCOPY (EGD) WITH PROPOFOL: SHX5813

## 2017-11-26 SURGERY — COLONOSCOPY WITH PROPOFOL
Anesthesia: Monitor Anesthesia Care

## 2017-11-26 MED ORDER — LACTATED RINGERS IV SOLN
INTRAVENOUS | Status: DC
  Administered 2017-11-26: 08:00:00 via INTRAVENOUS

## 2017-11-26 MED ORDER — FENTANYL CITRATE (PF) 100 MCG/2ML IJ SOLN
25.0000 ug | Freq: Once | INTRAMUSCULAR | Status: AC
  Administered 2017-11-26: 25 ug via INTRAVENOUS

## 2017-11-26 MED ORDER — LIDOCAINE HCL (CARDIAC) 10 MG/ML IV SOLN
INTRAVENOUS | Status: DC | PRN
  Administered 2017-11-26: 40 mg via INTRAVENOUS

## 2017-11-26 MED ORDER — STERILE WATER FOR IRRIGATION IR SOLN
Status: DC | PRN
  Administered 2017-11-26: 100 mL

## 2017-11-26 MED ORDER — MIDAZOLAM HCL 2 MG/2ML IJ SOLN
1.0000 mg | INTRAMUSCULAR | Status: AC
  Administered 2017-11-26: 2 mg via INTRAVENOUS

## 2017-11-26 MED ORDER — LIDOCAINE VISCOUS 2 % MT SOLN
OROMUCOSAL | Status: AC
  Filled 2017-11-26: qty 15

## 2017-11-26 MED ORDER — PROPOFOL 10 MG/ML IV BOLUS
INTRAVENOUS | Status: AC
  Filled 2017-11-26: qty 40

## 2017-11-26 MED ORDER — PROPOFOL 500 MG/50ML IV EMUL
INTRAVENOUS | Status: DC | PRN
  Administered 2017-11-26: 150 ug/kg/min via INTRAVENOUS

## 2017-11-26 MED ORDER — FENTANYL CITRATE (PF) 100 MCG/2ML IJ SOLN
INTRAMUSCULAR | Status: AC
  Filled 2017-11-26: qty 2

## 2017-11-26 MED ORDER — LIDOCAINE VISCOUS 2 % MT SOLN
15.0000 mL | Freq: Once | OROMUCOSAL | Status: AC
  Administered 2017-11-26: 15 mL via OROMUCOSAL

## 2017-11-26 MED ORDER — MIDAZOLAM HCL 2 MG/2ML IJ SOLN
INTRAMUSCULAR | Status: AC
  Filled 2017-11-26: qty 2

## 2017-11-26 MED ORDER — LIDOCAINE HCL (PF) 1 % IJ SOLN
INTRAMUSCULAR | Status: AC
  Filled 2017-11-26: qty 5

## 2017-11-26 NOTE — Discharge Instructions (Signed)
Colonoscopy Discharge Instructions  Read the instructions outlined below and refer to this sheet in the next few weeks. These discharge instructions provide you with general information on caring for yourself after you leave the hospital. Your doctor may also give you specific instructions. While your treatment has been planned according to the most current medical practices available, unavoidable complications occasionally occur. If you have any problems or questions after discharge, call Dr. Gala Romney at [phone]. ACTIVITY  You may resume your regular activity, but move at a slower pace for the next 24 hours.   Take frequent rest periods for the next 24 hours.   Walking will help get rid of the air and reduce the bloated feeling in your belly (abdomen).   No driving for 24 hours (because of the medicine (anesthesia) used during the test).    Do not sign any important legal documents or operate any machinery for 24 hours (because of the anesthesia used during the test).  NUTRITION  Drink plenty of fluids.   You may resume your normal diet as instructed by your doctor.   Begin with a light meal and progress to your normal diet. Heavy or fried foods are harder to digest and may make you feel sick to your stomach (nauseated).   Avoid alcoholic beverages for 24 hours or as instructed.  MEDICATIONS  You may resume your normal medications unless your doctor tells you otherwise.  WHAT YOU CAN EXPECT TODAY  Some feelings of bloating in the abdomen.   Passage of more gas than usual.   Spotting of blood in your stool or on the toilet paper.  IF YOU HAD POLYPS REMOVED DURING THE COLONOSCOPY:  No aspirin products for 7 days or as instructed.   No alcohol for 7 days or as instructed.   Eat a soft diet for the next 24 hours.  FINDING OUT THE RESULTS OF YOUR TEST Not all test results are available during your visit. If your test results are not back during the visit, make an appointment  with your caregiver to find out the results. Do not assume everything is normal if you have not heard from your caregiver or the medical facility. It is important for you to follow up on all of your test results.  SEEK IMMEDIATE MEDICAL ATTENTION IF:  You have more than a spotting of blood in your stool.   Your belly is swollen (abdominal distention).   You are nauseated or vomiting.   You have a temperature over 101.   You have abdominal pain or discomfort that is severe or gets worse throughout the day.   Repeat colonoscopy in 10 years for screening purposes  Office visit with [place] in 2 months.    PATIENT INSTRUCTIONS POST-ANESTHESIA  IMMEDIATELY FOLLOWING SURGERY:  Do not drive or operate machinery for the first twenty four hours after surgery.  Do not make any important decisions for twenty four hours after surgery or while taking narcotic pain medications or sedatives.  If you develop intractable nausea and vomiting or a severe headache please notify your doctor immediately.  FOLLOW-UP:  Please make an appointment with your surgeon as instructed. You do not need to follow up with anesthesia unless specifically instructed to do so.  WOUND CARE INSTRUCTIONS (if applicable):  Keep a dry clean dressing on the anesthesia/puncture wound site if there is drainage.  Once the wound has quit draining you may leave it open to air.  Generally you should leave the bandage intact for twenty  four hours unless there is drainage.  If the epidural site drains for more than 36-48 hours please call the anesthesia department.  QUESTIONS?:  Please feel free to call your physician or the hospital operator if you have any questions, and they will be happy to assist you.

## 2017-11-26 NOTE — Transfer of Care (Signed)
Immediate Anesthesia Transfer of Care Note  Patient: CHANCE KARAM  Procedure(s) Performed: COLONOSCOPY WITH PROPOFOL (N/A ) ESOPHAGOGASTRODUODENOSCOPY (EGD) WITH PROPOFOL (N/A )  Patient Location: PACU  Anesthesia Type:MAC  Level of Consciousness: awake, oriented and patient cooperative  Airway & Oxygen Therapy: Patient Spontanous Breathing and Patient connected to nasal cannula oxygen  Post-op Assessment: Report given to RN and Post -op Vital signs reviewed and stable  Post vital signs: Reviewed and stable  Last Vitals:  Vitals:   11/26/17 0745 11/26/17 0750  BP: 119/70 112/60  Pulse:    Resp: 14 15  Temp:    SpO2: 92% 94%    Last Pain:  Vitals:   11/26/17 0729  TempSrc: Oral         Complications: No apparent anesthesia complications

## 2017-11-26 NOTE — Anesthesia Postprocedure Evaluation (Signed)
Anesthesia Post Note  Patient: Kathleen Erickson  Procedure(s) Performed: COLONOSCOPY WITH PROPOFOL (N/A ) ESOPHAGOGASTRODUODENOSCOPY (EGD) WITH PROPOFOL (N/A )  Patient location during evaluation: PACU Anesthesia Type: MAC Level of consciousness: awake, oriented and patient cooperative Pain management: pain level controlled Vital Signs Assessment: post-procedure vital signs reviewed and stable Respiratory status: spontaneous breathing and respiratory function stable Cardiovascular status: stable Postop Assessment: no apparent nausea or vomiting Anesthetic complications: no     Last Vitals:  Vitals:   11/26/17 0745 11/26/17 0750  BP: 119/70 112/60  Pulse:    Resp: 14 15  Temp:    SpO2: 92% 94%    Last Pain:  Vitals:   11/26/17 0729  TempSrc: Oral                 ADAMS, AMY A

## 2017-11-26 NOTE — Anesthesia Preprocedure Evaluation (Addendum)
Anesthesia Evaluation  Patient identified by MRN, date of birth, ID band Patient awake    Reviewed: Allergy & Precautions, NPO status , Patient's Chart, lab work & pertinent test results  Airway Mallampati: III  TM Distance: >3 FB Neck ROM: Full    Dental  (+) Teeth Intact   Pulmonary shortness of breath, COPD, Current Smoker,    breath sounds clear to auscultation       Cardiovascular negative cardio ROS   Rhythm:Regular Rate:Normal     Neuro/Psych PSYCHIATRIC DISORDERS Anxiety Depression    GI/Hepatic PUD, GERD  ,  Endo/Other  Hypothyroidism   Renal/GU      Musculoskeletal   Abdominal   Peds  Hematology  (+) anemia ,   Anesthesia Other Findings   Reproductive/Obstetrics                            Anesthesia Physical Anesthesia Plan  ASA: III  Anesthesia Plan: MAC   Post-op Pain Management:    Induction: Intravenous  PONV Risk Score and Plan:   Airway Management Planned: Simple Face Mask  Additional Equipment:   Intra-op Plan:   Post-operative Plan: Extubation in OR  Informed Consent: I have reviewed the patients History and Physical, chart, labs and discussed the procedure including the risks, benefits and alternatives for the proposed anesthesia with the patient or authorized representative who has indicated his/her understanding and acceptance.     Plan Discussed with:   Anesthesia Plan Comments:         Anesthesia Quick Evaluation

## 2017-11-26 NOTE — H&P (Signed)
$'@LOGO'b$ @   Primary Care Physician:  Raylene Everts, MD Primary Gastroenterologist:  Dr. Gala Romney  Pre-Procedure History & Physical: HPI:  Kathleen Erickson is a 57 y.o. female here for diagnostic EGD and possible colonoscopy. History of iron deficiency anemia. No dysphagia or upper GI tract symptoms-currently well controlled on PPI.  Past Medical History:  Diagnosis Date  . Allergy   . Anemia    thalcemia  . Anxiety   . Arthritis   . Cancer [facility])    Cervical cancer  . COPD (chronic obstructive pulmonary disease) ([place])   . Depression   . Dyspnea   . GERD (gastroesophageal reflux disease)   . Hyperlipidemia   . Hypothyroidism     Past Surgical History:  Procedure Laterality Date  . carpel tunnel release     bilaterally  . CESAREAN SECTION    . COLONOSCOPY N/A 06/22/2014   Dr. Mikaelyn Arthurs:External and internal hemorrhoids-grade 4; otherwise normal rectum total colon and terminal ileum.  . COLPOSCOPY    . ESOPHAGOGASTRODUODENOSCOPY N/A 06/22/2014   Dr. Gala Romney:The mucosa of the esophagus appeared normal  Single gastric ulcer ranging between 3-5 mm in size was found   Biopsy with negative H.pylori.   . ESOPHAGOGASTRODUODENOSCOPY N/A 10/28/2014   Dr. Gala Romney: patulous EG junction, small hiatal hernia, previous noted gastric ulcer healed  . VAGINAL HYSTERECTOMY     bleeding    Prior to Admission medications   Medication Sig Start Date End Date Taking? Authorizing Provider  albuterol (PROVENTIL HFA;VENTOLIN HFA) 108 (90 Base) MCG/ACT inhaler Inhale 2 puffs into the lungs every 6 (six) hours as needed for wheezing or shortness of breath. 01/31/17  Yes Soyla Dryer, PA-C  azithromycin (ZITHROMAX) 250 MG tablet tad 11/22/17  Yes Raylene Everts, MD  benzonatate (TESSALON) 200 MG capsule Take 1 capsule (200 mg total) by mouth 2 (two) times daily as needed for cough. 11/22/17  Yes Raylene Everts, MD  bisacodyl (DULCOLAX) 5 MG EC tablet Take 5 mg by mouth daily as needed for moderate  constipation.   Yes [provider]  calcium carbonate (OSCAL) 1500 (600 Ca) MG TABS tablet Take 1,500 mg by mouth daily with breakfast.   Yes [provider]  CLENPIQ 10-3.5-12 MG-GM -GM/160ML SOLN TAKE 1 KIT BY MOUTH ONCE FOR 1 DOSE. 10/29/17  Yes [provider]  desvenlafaxine (PRISTIQ) 50 MG 24 hr tablet Take 1 tablet (50 mg total) by mouth daily. 04/16/17  Yes Soyla Dryer, PA-C  FISH OIL-VITAMIN D PO Take 1 tablet by mouth daily.   Yes [provider]  hydrocortisone (ANUSOL-HC) 2.5 % rectal cream Place 1 application rectally 2 (two) times daily. 10/16/17  Yes Annitta Needs, NP  ibuprofen (ADVIL,MOTRIN) 200 MG tablet Take 400 mg by mouth every 8 (eight) hours as needed for headache.   Yes [provider]  IRON PO Take 27 mg by mouth 3 (three) times a week.    Yes [provider]  levothyroxine (SYNTHROID, LEVOTHROID) 25 MCG tablet TAKE 1 TABLET BY MOUTH EVERY MORNING BEFORE BREAKFAST 07/17/17  Yes Raylene Everts, MD  lubiprostone (AMITIZA) 24 MCG capsule Take 1 capsule (24 mcg total) by mouth 2 (two) times daily with a meal. Take on a full stomach 11/01/17  Yes Carlis Stable, NP  meclizine (ANTIVERT) 25 MG tablet Take 1 tablet (25 mg total) by mouth 3 (three) times daily as needed for dizziness. 11/01/16  Yes Soyla Dryer, PA-C  Multiple Vitamins-Minerals (MULTIVITAMIN WITH MINERALS) tablet Take  1 tablet by mouth daily.   Yes [provider]  omeprazole (PRILOSEC) 20 MG capsule Take 1 capsule (20 mg total) by mouth 2 (two) times daily before a meal. 07/17/17  Yes Raylene Everts, MD  simvastatin (ZOCOR) 20 MG tablet Take 1 tablet (20 mg total) by mouth at bedtime. 07/17/17  Yes Raylene Everts, MD  vitamin B-12 (CYANOCOBALAMIN) 500 MCG tablet Take 500 mcg by mouth daily.   Yes [provider]    Allergies as of 10/29/2017 - Review Complete 10/16/2017  Allergen Reaction Noted  . Penicillins Hives 01/20/2014  .  Morphine and related Itching 01/20/2014    Family History  Problem Relation Age of Onset  . Uterine cancer Mother   . Heart disease Mother   . Cancer Mother   . Alcohol abuse Mother   . Arthritis Mother   . COPD Mother   . Depression Mother   . Heart disease Father        age 60, family questions autopsy  . Alcohol abuse Father   . Arthritis Father   . Depression Father   . Early death Father   . Cancer Other        ulcer, aunt  . Lung cancer Cousin   . Throat cancer Cousin   . Asthma Son   . Colon cancer Neg Hx   . Ulcers Neg Hx     Social History   Socioeconomic History  . Marital status: Married    Spouse name: [name]  . Number of children: 2  . Years of education: 10  . Highest education level: Not on file  Social Needs  . Financial resource strain: Not on file  . Food insecurity - worry: Not on file  . Food insecurity - inability: Not on file  . Transportation needs - medical: Not on file  . Transportation needs - non-medical: Not on file  Occupational History  . Occupation: not working    Comment: cleaned houses    Comment: disabled from arthritis  Tobacco Use  . Smoking status: Current Every Day Smoker    Packs/day: 0.25    Years: 40.00    Pack years: 10.00    Start date: 09/25/1973  . Smokeless tobacco: Never Used  . Tobacco comment: one pack - 4 days  Substance and Sexual Activity  . Alcohol use: No  . Drug use: No  . Sexual activity: Not Currently  Other Topics Concern  . Not on file  Social History Narrative   Lives at home with [name]   Visits sister - stays at home    Review of Systems: See HPI, otherwise negative ROS  Physical Exam: BP 115/68   Pulse 72   Temp 98.4 F (36.9 C) (Oral)   Resp 16   SpO2 100%  General:   Alert,  Well-developed, well-nourished, pleasant and cooperative in NAD Mouth:  No deformity or lesions. Neck:  Supple; no masses or thyromegaly. No significant cervical adenopathy. Lungs:  Clear throughout to  auscultation.   No wheezes, crackles, or rhonchi. No acute distress. Heart:  Regular rate and rhythm; no murmurs, clicks, rubs,  or gallops. Abdomen: Non-distended, normal bowel sounds.  Soft and nontender without appreciable mass or hepatosplenomegaly.  Pulses:  Normal pulses noted. Extremities:  Without clubbing or edema.  Impression:   57 year old lady with microcytic/iron deficiency anemia. Does have a history of thalassemia. Further GI evaluation as outlined.  Recommendations:  I have offered the patient a colonoscopy possible EGD  to follow today per plan. The risks, benefits, limitations, alternatives and imponderables have been reviewed with the patient. Questions have been answered. All parties are agreeable.    Notice: This dictation was prepared with Dragon dictation along with smaller phrase technology. Any transcriptional errors that result from this process are unintentional and may not be corrected upon review.

## 2017-11-26 NOTE — Op Note (Signed)
[facility] Patient Name: Kathleen Erickson Procedure Date: 11/26/2017 8:25 AM MRN: [id number] Date of Birth: [birth year] Attending MD: Norvel Richards , MD CSN: [id number] Age: 57 Admit Type: Outpatient Procedure:                Upper GI endoscopy Indications:              Iron deficiency anemia Providers:                Norvel Richards, MD, Rosina Lowenstein, RN, Randa Spike, Technician Referring MD:             Lysle Morales Medicines:                Propofol per Anesthesia Complications:            No immediate complications. Estimated Blood Loss:     Estimated blood loss: none. Estimated blood loss:                            none. Procedure:                Pre-Anesthesia Assessment:                           - Prior to the procedure, a History and Physical                            was performed, and patient medications and                            allergies were reviewed. The patient's tolerance of                            previous anesthesia was also reviewed. The risks                            and benefits of the procedure and the sedation                            options and risks were discussed with the patient.                            All questions were answered, and informed consent                            was obtained. Prior Anticoagulants: The patient has                            taken no previous anticoagulant or antiplatelet                            agents. ASA Grade Assessment: II - A patient with  mild systemic disease. After reviewing the risks                            and benefits, the patient was deemed in                            satisfactory condition to undergo the procedure.                           After obtaining informed consent, the endoscope was                            passed under direct vision. Throughout the                            procedure, the patient's blood  pressure, pulse, and                            oxygen saturations were monitored continuously. The                            [id number] ([id number]) scope was introduced through the                            and advanced to the third part of duodenum. The                            [id number] ([id number]) scope was introduced through the                            and advanced to the. The upper GI endoscopy was                            accomplished without difficulty. The patient                            tolerated the procedure well. Scope In: 8:28:19 AM Scope Out: 8:32:34 AM Total Procedure Duration: 0 hours 4 minutes 15 seconds  Findings:      The examined esophagus was normal.      The entire examined stomach was normal.      The duodenal bulb, second portion of the duodenum and third portion of       the duodenum were normal. Impression:               - Normal esophagus.                           - Normal stomach.                           - Normal duodenal bulb, second portion of the                            duodenum and third portion of the duodenum.                           -  No specimens collected. Moderate Sedation:      Moderate (conscious) sedation was personally administered by an       anesthesia professional. The following parameters were monitored: oxygen       saturation, heart rate, blood pressure, respiratory rate, EKG, adequacy       of pulmonary ventilation, and response to care. Total physician       intraservice time was 29 minutes. Recommendation:           - Patient has a contact number available for                            emergencies. The signs and symptoms of potential                            delayed complications were discussed with the                            patient. Return to normal activities tomorrow.                            Written discharge instructions were provided to the                            patient.                           -  Advance diet as tolerated.                           - Continue present medications.                           - No repeat upper endoscopy.                           - Return to GI office in 2 months. Procedure Code(s):        --- Professional ---                           [phone], Esophagogastroduodenoscopy, flexible,                            transoral; diagnostic, including collection of                            specimen(s) by brushing or washing, when performed                            (separate procedure) Diagnosis Code(s):        --- Professional ---                           D50.9, Iron deficiency anemia, unspecified CPT copyright 2016 American Medical Association. All rights reserved. The codes documented in this report are preliminary and upon coder review may  be revised to meet current compliance requirements. Kathleen Erickson. Gala Romney, MD Norvel Richards, MD 11/26/2017 8:40:07  AM This report has been signed electronically. Number of Addenda: 0

## 2017-11-26 NOTE — Op Note (Signed)
[facility] Patient Name: Kathleen Erickson Procedure Date: 11/26/2017 7:58 AM MRN: [id number] Date of Birth: 07-11-1961 Attending MD: Norvel Richards , MD CSN: [id number] Age: 57 Admit Type: Outpatient Procedure:                Colonoscopy Indications:              Iron deficiency anemia Providers:                Norvel Richards, MD, Rosina Lowenstein, RN, Randa Spike, Technician Referring MD:             Lysle Morales Medicines:                Propofol per Anesthesia Complications:            No immediate complications. Estimated Blood Loss:     Estimated blood loss: none. Procedure:                Pre-Anesthesia Assessment:                           - Prior to the procedure, a History and Physical                            was performed, and patient medications and                            allergies were reviewed. The patient's tolerance of                            previous anesthesia was also reviewed. The risks                            and benefits of the procedure and the sedation                            options and risks were discussed with the patient.                            All questions were answered, and informed consent                            was obtained. Prior Anticoagulants: The patient has                            taken no previous anticoagulant or antiplatelet                            agents. ASA Grade Assessment: II - A patient with                            mild systemic disease. After reviewing the risks  and benefits, the patient was deemed in                            satisfactory condition to undergo the procedure.                           After obtaining informed consent, the colonoscope                            was passed under direct vision. Throughout the                            procedure, the patient's blood pressure, pulse, and                            oxygen  saturations were monitored continuously. The                            [id number] ([id number]) scope was introduced through                            the anus and advanced to the 5 cm into the ileum.                            The colonoscopy was performed without difficulty.                            The patient tolerated the procedure well. The                            quality of the bowel preparation was adequate. The                            ileocecal valve, appendiceal orifice, and rectum                            were photographed. Scope In: 8:12:17 AM Scope Out: 8:22:18 AM Scope Withdrawal Time: 0 hours 6 minutes 42 seconds  Total Procedure Duration: 0 hours 10 minutes 1 second  Findings:      The perianal and digital rectal examinations were normal.      External and internal hemorrhoids were found during retroflexion. The       hemorrhoids were moderate, large, Grade III (internal hemorrhoids that       prolapse but require manual reduction) and Grade IV (internal       hemorrhoids that prolapse and cannot be reduced manually). Prominent       anal papilla.      The colon appeared normal. Distal 5 cm of terminal ileum appeared normal. Impression:               - External and internal hemorrhoids. Prominent anal                            papilla                           -  normal rectum and colon.Normal terminal ileum.                           - No specimens collected. Moderate Sedation:      Moderate (conscious) sedation was personally administered by an       anesthesia professional. The following parameters were monitored: oxygen       saturation, heart rate, blood pressure, respiratory rate, EKG, adequacy       of pulmonary ventilation, and response to care. Total physician       intraservice time was 19 minutes. Recommendation:           - Patient has a contact number available for                            emergencies. The signs and symptoms of potential                             delayed complications were discussed with the                            patient. Return to normal activities tomorrow.                            Written discharge instructions were provided to the                            patient.                           - Resume previous diet.                           - Continue present medications. See EGD report                           - Repeat colonoscopy in 10 years for screening                            purposes. Procedure Code(s):        --- Professional ---                           [phone], Colonoscopy, flexible; diagnostic, including                            collection of specimen(s) by brushing or washing,                            when performed (separate procedure) Diagnosis Code(s):        --- Professional ---                           X32.4, Fourth degree hemorrhoids                           D50.9, Iron deficiency anemia, unspecified CPT copyright 2016 American Medical Association.  All rights reserved. The codes documented in this report are preliminary and upon coder review may  be revised to meet current compliance requirements. Cristopher Estimable. Ivelis Norgard, MD Norvel Richards, MD 11/26/2017 8:35:48 AM This report has been signed electronically. Number of Addenda: 0

## 2017-11-28 ENCOUNTER — Encounter (HOSPITAL_COMMUNITY): Payer: Self-pay | Admitting: Internal Medicine

## 2017-12-03 ENCOUNTER — Encounter: Payer: Self-pay | Admitting: Family Medicine

## 2017-12-16 ENCOUNTER — Other Ambulatory Visit: Payer: Self-pay | Admitting: Physician Assistant

## 2017-12-16 ENCOUNTER — Other Ambulatory Visit: Payer: Self-pay | Admitting: Family Medicine

## 2017-12-28 ENCOUNTER — Telehealth: Payer: Self-pay | Admitting: Family Medicine

## 2017-12-28 NOTE — Telephone Encounter (Signed)
Please advise. Thank you   - S.  

## 2017-12-28 NOTE — Telephone Encounter (Signed)
Call GI office for refill

## 2017-12-28 NOTE — Telephone Encounter (Signed)
Patients requesting rx for linzess, she says the ami not working for her (Dr.Rourke prescribes this ) she said she was told to rotate those medications if the [place] does not work for her. I asked her if she called Dr.Rourkes office and she said no.  Cb#: [phone] Can leave detailed messages per patient    Noted: patient states she called and left a voicemail asking if she could follow Dr.Nelson to the Urgent care and still see her. I told her I checked the vm the past 2 days and I had nothing from her. She then states she left the voicemail 2-3 weeks ago. I told her the only reasons she wouldn't receive a call back is if we couldn't decipher whom she was based on the information left on the recording. She said she also requested medication on a voicemail. I told her that she needs to call her pharmacy. She states she did. THERE IS NO DOCUMENTATION SUPPORTING ANY OF THIS.

## 2017-12-28 NOTE — Telephone Encounter (Signed)
Left message for patient to call back  

## 2017-12-31 ENCOUNTER — Other Ambulatory Visit: Payer: Self-pay | Admitting: Physician Assistant

## 2018-01-09 ENCOUNTER — Telehealth: Payer: Self-pay

## 2018-01-09 NOTE — Telephone Encounter (Signed)
Pt left a VM, pt would like to cancel her upcoming appointment for 01/14/18. Pt didn't state that she would like to r/s. [phone].

## 2018-01-10 NOTE — Telephone Encounter (Signed)
CANCELLED APPOINTMENT

## 2018-01-14 ENCOUNTER — Ambulatory Visit: Payer: Medicaid Other | Admitting: Gastroenterology

## 2018-02-11 ENCOUNTER — Ambulatory Visit: Payer: Medicaid Other | Admitting: Family Medicine

## 2018-03-07 DIAGNOSIS — E785 Hyperlipidemia, unspecified: Secondary | ICD-10-CM | POA: Diagnosis not present

## 2018-03-07 DIAGNOSIS — N951 Menopausal and female climacteric states: Secondary | ICD-10-CM | POA: Diagnosis not present

## 2018-03-07 DIAGNOSIS — E039 Hypothyroidism, unspecified: Secondary | ICD-10-CM | POA: Diagnosis not present

## 2018-03-29 DIAGNOSIS — L02214 Cutaneous abscess of groin: Secondary | ICD-10-CM | POA: Diagnosis not present

## 2018-04-15 DIAGNOSIS — N951 Menopausal and female climacteric states: Secondary | ICD-10-CM | POA: Diagnosis not present

## 2018-04-15 DIAGNOSIS — F331 Major depressive disorder, recurrent, moderate: Secondary | ICD-10-CM | POA: Diagnosis not present

## 2018-04-15 DIAGNOSIS — E785 Hyperlipidemia, unspecified: Secondary | ICD-10-CM | POA: Diagnosis not present

## 2018-04-15 DIAGNOSIS — R5383 Other fatigue: Secondary | ICD-10-CM | POA: Diagnosis not present

## 2018-04-15 DIAGNOSIS — E039 Hypothyroidism, unspecified: Secondary | ICD-10-CM | POA: Diagnosis not present

## 2018-06-24 DIAGNOSIS — F331 Major depressive disorder, recurrent, moderate: Secondary | ICD-10-CM | POA: Diagnosis not present

## 2018-06-24 DIAGNOSIS — E039 Hypothyroidism, unspecified: Secondary | ICD-10-CM | POA: Diagnosis not present

## 2018-06-24 DIAGNOSIS — L989 Disorder of the skin and subcutaneous tissue, unspecified: Secondary | ICD-10-CM | POA: Diagnosis not present

## 2018-06-24 DIAGNOSIS — N951 Menopausal and female climacteric states: Secondary | ICD-10-CM | POA: Diagnosis not present

## 2018-06-24 DIAGNOSIS — D538 Other specified nutritional anemias: Secondary | ICD-10-CM | POA: Diagnosis not present

## 2018-06-24 DIAGNOSIS — Z23 Encounter for immunization: Secondary | ICD-10-CM | POA: Diagnosis not present

## 2018-07-04 ENCOUNTER — Encounter: Payer: Self-pay | Admitting: Internal Medicine

## 2018-07-09 ENCOUNTER — Other Ambulatory Visit: Payer: Self-pay | Admitting: Physician Assistant

## 2018-07-18 DIAGNOSIS — E039 Hypothyroidism, unspecified: Secondary | ICD-10-CM | POA: Diagnosis not present

## 2018-07-18 DIAGNOSIS — D649 Anemia, unspecified: Secondary | ICD-10-CM | POA: Diagnosis not present

## 2018-07-18 DIAGNOSIS — N951 Menopausal and female climacteric states: Secondary | ICD-10-CM | POA: Diagnosis not present

## 2018-08-06 ENCOUNTER — Ambulatory Visit: Payer: Medicaid Other | Admitting: Internal Medicine

## 2018-08-09 ENCOUNTER — Ambulatory Visit: Payer: Medicaid Other | Admitting: Internal Medicine

## 2018-08-09 ENCOUNTER — Encounter: Payer: Self-pay | Admitting: Internal Medicine

## 2018-08-09 VITALS — BP 113/63 | HR 75 | Temp 98.1°F | Ht 61.0 in | Wt 138.0 lb

## 2018-08-09 DIAGNOSIS — K5909 Other constipation: Secondary | ICD-10-CM

## 2018-08-09 DIAGNOSIS — D508 Other iron deficiency anemias: Secondary | ICD-10-CM | POA: Diagnosis not present

## 2018-08-09 NOTE — Patient Instructions (Signed)
Serum total IgA and TTG IgA  Informational constipation provided  Stop Linzess  Begin trulance 3 mg once daily.  I have provided you with [place] 3-week supply of samples  I recommend you pursue iron injections instead of oral therapy.  Given history of thalassemia, I feel it would be a good idea for you to see the hematologist.  Will communicate with Dr. Anastasio Champion.  Let us know in 3 weeks how trulance is working for you.  Office visit 3 months.

## 2018-08-09 NOTE — Progress Notes (Signed)
Primary Care Physician:  Doree Albee, MD Primary Gastroenterologist:  Dr. Gala Romney     Pre-Procedure History & Physical: HPI:  Kathleen Erickson is a 57 y.o. female here for further evaluation of iron deficiency anemia.  History of thalassemia.  Has paper hematochezia felt to be secondary to hemorrhoids.  Recent EGD and colonoscopy updated and findings reassuring.  She would B12 deficiency.  Small bowel looked normal at an EGD.  No serologic celiac screen, however. Past Medical History:  Diagnosis Date  . Allergy   . Anemia    thalcemia  . Anxiety   . Arthritis   . Cancer [facility])    Cervical cancer  . COPD (chronic obstructive pulmonary disease) ([place])   . Depression   . Dyspnea   . GERD (gastroesophageal reflux disease)   . Hyperlipidemia   . Hypothyroidism     Past Surgical History:  Procedure Laterality Date  . carpel tunnel release     bilaterally  . CESAREAN SECTION    . COLONOSCOPY N/A 06/22/2014   Dr. Nakeia Calvi:External and internal hemorrhoids-grade 4; otherwise normal rectum total colon and terminal ileum.  . COLONOSCOPY WITH PROPOFOL N/A 11/26/2017   Procedure: COLONOSCOPY WITH PROPOFOL;  Surgeon: Daneil Dolin, MD;  Location: [facility];  Service: Endoscopy;  Laterality: N/A;  9:45am  . COLPOSCOPY    . ESOPHAGOGASTRODUODENOSCOPY N/A 06/22/2014   Dr. Gala Romney:The mucosa of the esophagus appeared normal  Single gastric ulcer ranging between 3-5 mm in size was found   Biopsy with negative H.pylori.   . ESOPHAGOGASTRODUODENOSCOPY N/A 10/28/2014   Dr. Gala Romney: patulous EG junction, small hiatal hernia, previous noted gastric ulcer healed  . ESOPHAGOGASTRODUODENOSCOPY (EGD) WITH PROPOFOL N/A 11/26/2017   Procedure: ESOPHAGOGASTRODUODENOSCOPY (EGD) WITH PROPOFOL;  Surgeon: Daneil Dolin, MD;  Location: [facility];  Service: Endoscopy;  Laterality: N/A;  . VAGINAL HYSTERECTOMY     bleeding    Prior to Admission medications   Medication Sig Start Date End Date Taking?  Authorizing Provider  albuterol (PROVENTIL HFA;VENTOLIN HFA) 108 (90 Base) MCG/ACT inhaler Inhale 2 puffs into the lungs every 6 (six) hours as needed for wheezing or shortness of breath. 01/31/17  Yes Soyla Dryer, PA-C  benzonatate (TESSALON) 200 MG capsule Take 1 capsule (200 mg total) by mouth 2 (two) times daily as needed for cough. 11/22/17  Yes Raylene Everts, MD  Biotin 10000 MCG TABS Take 1 tablet by mouth daily.   Yes [provider]  bisacodyl (DULCOLAX) 5 MG EC tablet Take 5 mg by mouth daily as needed for moderate constipation.   Yes [provider]  calcium carbonate (OSCAL) 1500 (600 Ca) MG TABS tablet Take 1,500 mg by mouth daily with breakfast.   Yes [provider]  desvenlafaxine (PRISTIQ) 50 MG 24 hr tablet Take 1 tablet (50 mg total) by mouth daily. 04/16/17  Yes Soyla Dryer, PA-C  estradiol (ESTRACE) 1 MG tablet Take 1.5 mg by mouth daily.   Yes [provider]  FISH OIL-VITAMIN D PO Take 1 tablet by mouth daily.   Yes [provider]  hydrocortisone (ANUSOL-HC) 2.5 % rectal cream Place 1 application rectally 2 (two) times daily. Patient taking differently: Place 1 application rectally as needed.  10/16/17  Yes Annitta Needs, NP  ibuprofen (ADVIL,MOTRIN) 200 MG tablet Take 400 mg by mouth every 8 (eight) hours as needed for headache.   Yes [provider]  IRON PO Take 27 mg by mouth 3 (three)  times a week.    Yes [provider]  lubiprostone (AMITIZA) 24 MCG capsule Take 1 capsule (24 mcg total) by mouth 2 (two) times daily with a meal. Take on a full stomach Patient taking differently: Take 24 mcg by mouth 2 (two) times daily as needed. Take on a full stomach 11/01/17  Yes Carlis Stable, NP  meclizine (ANTIVERT) 25 MG tablet Take 1 tablet (25 mg total) by mouth 3 (three) times daily as needed for dizziness. 11/01/16  Yes Soyla Dryer, PA-C  Multiple Vitamins-Minerals (MULTIVITAMIN WITH MINERALS) tablet Take  1 tablet by mouth daily.   Yes [provider]  omeprazole (PRILOSEC) 20 MG capsule Take 1 capsule (20 mg total) by mouth 2 (two) times daily before a meal. 07/17/17  Yes Raylene Everts, MD  progesterone (PROMETRIUM) 200 MG capsule Take 200 mg by mouth at bedtime.   Yes [provider]  thyroid (ARMOUR) 60 MG tablet Take 60 mg by mouth daily before breakfast.   Yes [provider]  azithromycin (ZITHROMAX) 250 MG tablet tad Patient not taking: Reported on 08/09/2018 11/22/17   Raylene Everts, MD  CLENPIQ 10-3.5-12 MG-GM -GM/160ML SOLN TAKE 1 KIT BY MOUTH ONCE FOR 1 DOSE. 10/29/17   [provider]  levothyroxine (SYNTHROID, LEVOTHROID) 25 MCG tablet TAKE 1 TABLET BY MOUTH EVERY MORNING BEFORE BREAKFAST Patient not taking: Reported on 08/09/2018 07/17/17   Raylene Everts, MD  simvastatin (ZOCOR) 20 MG tablet Take 1 tablet (20 mg total) by mouth at bedtime. Patient not taking: Reported on 08/09/2018 07/17/17   Raylene Everts, MD  vitamin B-12 (CYANOCOBALAMIN) 500 MCG tablet Take 500 mcg by mouth daily.    [provider]    Allergies as of 08/09/2018 - Review Complete 08/09/2018  Allergen Reaction Noted  . Penicillins Hives 01/20/2014  . Morphine and related Itching 01/20/2014    Family History  Problem Relation Age of Onset  . Uterine cancer Mother   . Heart disease Mother   . Cancer Mother   . Alcohol abuse Mother   . Arthritis Mother   . COPD Mother   . Depression Mother   . Heart disease Father        age 86, family questions autopsy  . Alcohol abuse Father   . Arthritis Father   . Depression Father   . Early death Father   . Cancer Other        ulcer, aunt  . Lung cancer Cousin   . Throat cancer Cousin   . Asthma Son   . Colon cancer Neg Hx   . Ulcers Neg Hx     Social History   Socioeconomic History  . Marital status: Married    Spouse name: [name]  . Number of children: 2  . Years of education: 10  .  Highest education level: Not on file  Occupational History  . Occupation: not working    Comment: cleaned houses    Comment: disabled from arthritis  Social Needs  . Financial resource strain: Not on file  . Food insecurity:    Worry: Not on file    Inability: Not on file  . Transportation needs:    Medical: Not on file    Non-medical: Not on file  Tobacco Use  . Smoking status: Current Every Day Smoker    Packs/day: 0.25    Years: 40.00    Pack years: 10.00    Start date: 09/25/1973  . Smokeless tobacco: Never  Used  . Tobacco comment: one pack - 4 days  Substance and Sexual Activity  . Alcohol use: No  . Drug use: No  . Sexual activity: Not Currently  Lifestyle  . Physical activity:    Days per week: Not on file    Minutes per session: Not on file  . Stress: Not on file  Relationships  . Social connections:    Talks on phone: Not on file    Gets together: Not on file    Attends religious service: Not on file    Active member of club or organization: Not on file    Attends meetings of clubs or organizations: Not on file    Relationship status: Not on file  . Intimate partner violence:    Fear of current or ex partner: Not on file    Emotionally abused: Not on file    Physically abused: Not on file    Forced sexual activity: Not on file  Other Topics Concern  . Not on file  Social History Narrative   Lives at home with [name]   Visits sister - stays at home    Review of Systems: See HPI, otherwise negative ROS  Physical Exam: BP 113/63   Pulse 75   Temp 98.1 F (36.7 C) (Oral)   Ht _0  (1.549 m)   Wt 138 lb (62.6 kg)   BMI 26.07 kg/m  General:   Alert,  Well-developed, well-nourished, pleasant and cooperative in NAD Neck:  Supple; no masses or thyromegaly. No significant cervical adenopathy. Lungs:  Clear throughout to auscultation.   No wheezes, crackles, or rhonchi. No acute distress. Heart:  Regular rate and rhythm; no murmurs, clicks, rubs,  or  gallops. Abdomen: Non-distended, normal bowel sounds.  Soft and nontender without appreciable mass or hepatosplenomegaly.  Pulses:  Normal pulses noted. Extremities:  Without clubbing or edema.  Impression/Plan: 57 year old lady with ongoing iron deficiency anemia.  Family history of thalassemia. EGD and colonoscopy earlier in the year.  She does have symptomatic hemorrhoids and paper hematochezia which likely is not related to iron deficiency anemia.  Constipation is an ongoing challenge.  Pretty much related to oral iron supplementation.  She would likely be better served by parenteral iron infusions periodically.  She has never seen a hematologist.  I think it would be a good idea for her to see one in consultation at least once and I suspect her hemorrhoid symptoms will settle down once her constipation is more effectively managed.  Recommendations:   Serum total IgA and TTG IgA  Informational on constipation provided  Stop Linzess  Begin trulance 3 mg once daily.  I have provided a 3-week supply of samples  I recommend iron injections instead of oral therapy.  Given history of thalassemia, I feel it would be a good idea for patient to see the hematologist.  Will communicate with Dr. Anastasio Champion.  Patient is to know in 3 weeks how trulance is working.  Office visit 3 months.   Notice: This dictation was prepared with Dragon dictation along with smaller phrase technology. Any transcriptional errors that result from this process are unintentional and may not be corrected upon review.

## 2018-08-12 DIAGNOSIS — D508 Other iron deficiency anemias: Secondary | ICD-10-CM | POA: Diagnosis not present

## 2018-08-12 DIAGNOSIS — K5909 Other constipation: Secondary | ICD-10-CM | POA: Diagnosis not present

## 2018-08-13 LAB — TISSUE TRANSGLUTAMINASE, IGA: (tTG) Ab, IgA: 1 U/mL

## 2018-08-13 LAB — IGA: Immunoglobulin A: 94 mg/dL (ref 47–310)

## 2018-09-05 DIAGNOSIS — N951 Menopausal and female climacteric states: Secondary | ICD-10-CM | POA: Diagnosis not present

## 2018-09-05 DIAGNOSIS — E039 Hypothyroidism, unspecified: Secondary | ICD-10-CM | POA: Diagnosis not present

## 2018-09-05 DIAGNOSIS — E785 Hyperlipidemia, unspecified: Secondary | ICD-10-CM | POA: Diagnosis not present

## 2018-09-05 DIAGNOSIS — F331 Major depressive disorder, recurrent, moderate: Secondary | ICD-10-CM | POA: Diagnosis not present

## 2018-09-05 DIAGNOSIS — H66009 Acute suppurative otitis media without spontaneous rupture of ear drum, unspecified ear: Secondary | ICD-10-CM | POA: Diagnosis not present

## 2018-09-05 DIAGNOSIS — D649 Anemia, unspecified: Secondary | ICD-10-CM | POA: Diagnosis not present

## 2018-09-10 ENCOUNTER — Other Ambulatory Visit (HOSPITAL_COMMUNITY): Payer: Self-pay | Admitting: Internal Medicine

## 2018-09-10 DIAGNOSIS — Z780 Asymptomatic menopausal state: Secondary | ICD-10-CM

## 2018-09-26 ENCOUNTER — Encounter (HOSPITAL_COMMUNITY): Payer: Self-pay

## 2018-09-26 ENCOUNTER — Ambulatory Visit (HOSPITAL_COMMUNITY): Payer: Medicaid Other

## 2018-10-14 ENCOUNTER — Encounter: Payer: Self-pay | Admitting: Internal Medicine

## 2018-11-05 DIAGNOSIS — J329 Chronic sinusitis, unspecified: Secondary | ICD-10-CM | POA: Diagnosis not present

## 2018-11-05 DIAGNOSIS — J209 Acute bronchitis, unspecified: Secondary | ICD-10-CM | POA: Diagnosis not present

## 2018-11-05 DIAGNOSIS — J111 Influenza due to unidentified influenza virus with other respiratory manifestations: Secondary | ICD-10-CM | POA: Diagnosis not present

## 2018-11-05 DIAGNOSIS — J029 Acute pharyngitis, unspecified: Secondary | ICD-10-CM | POA: Diagnosis not present

## 2018-12-05 DIAGNOSIS — E785 Hyperlipidemia, unspecified: Secondary | ICD-10-CM | POA: Diagnosis not present

## 2018-12-05 DIAGNOSIS — N951 Menopausal and female climacteric states: Secondary | ICD-10-CM | POA: Diagnosis not present

## 2018-12-05 DIAGNOSIS — E039 Hypothyroidism, unspecified: Secondary | ICD-10-CM | POA: Diagnosis not present

## 2018-12-05 DIAGNOSIS — D649 Anemia, unspecified: Secondary | ICD-10-CM | POA: Diagnosis not present

## 2019-01-16 ENCOUNTER — Ambulatory Visit (INDEPENDENT_AMBULATORY_CARE_PROVIDER_SITE_OTHER): Payer: Self-pay | Admitting: Internal Medicine

## 2019-01-16 DIAGNOSIS — F331 Major depressive disorder, recurrent, moderate: Secondary | ICD-10-CM | POA: Diagnosis not present

## 2019-01-16 DIAGNOSIS — N951 Menopausal and female climacteric states: Secondary | ICD-10-CM | POA: Diagnosis not present

## 2019-01-16 DIAGNOSIS — Z780 Asymptomatic menopausal state: Secondary | ICD-10-CM | POA: Diagnosis not present

## 2019-01-16 DIAGNOSIS — E039 Hypothyroidism, unspecified: Secondary | ICD-10-CM | POA: Diagnosis not present

## 2019-01-16 DIAGNOSIS — E785 Hyperlipidemia, unspecified: Secondary | ICD-10-CM | POA: Diagnosis not present

## 2019-01-17 ENCOUNTER — Ambulatory Visit (INDEPENDENT_AMBULATORY_CARE_PROVIDER_SITE_OTHER): Payer: Medicaid Other | Admitting: Internal Medicine

## 2019-01-17 ENCOUNTER — Other Ambulatory Visit: Payer: Self-pay

## 2019-01-17 ENCOUNTER — Encounter: Payer: Self-pay | Admitting: Internal Medicine

## 2019-01-17 DIAGNOSIS — K219 Gastro-esophageal reflux disease without esophagitis: Secondary | ICD-10-CM

## 2019-01-17 DIAGNOSIS — K5909 Other constipation: Secondary | ICD-10-CM | POA: Diagnosis not present

## 2019-01-17 MED ORDER — PLECANATIDE 3 MG PO TABS: 3.0000 mg | ORAL_TABLET | Freq: Every day | ORAL | 3 refills | Status: AC

## 2019-01-17 NOTE — Patient Instructions (Signed)
  Resume omeprazole-40 mg tablet once daily dispense 90 with 3 refills.  Resume trueLance 3 mg daily.  Prescription and samples provided-dispense 90 with 3 refills  Continue B12 supplementation  Continue to recommend hematology evaluation for her reported diagnosis of thalassemia  Plan for face-to-face office visit in 6 months.

## 2019-01-17 NOTE — Progress Notes (Signed)
Referring Provider:  Primary Care Physician:  Doree Albee, MD  Primary GI:   Virtual Visit via Telephone Note Due to COVID-19, visit is conducted virtually and was requested by patient.   I connected with Kathleen Erickson on 16/10/96 at  9:00 AM EDT by telephone and verified that I am speaking with the correct person using two identifiers.   I discussed the limitations, risks, security and privacy concerns of performing an evaluation and management service by telephone and the availability of in person appointments. I also discussed with the patient that there may be a patient responsible charge related to this service. The patient expressed understanding and agreed to proceed.  Chief Complaint  Patient presents with  . Constipation    ran out of Trulance samples;takes dulcolax at night and Amitiza prn; constipation better since Progesterone was increased  . Bloated     History of Present Illness: 58 year old lady with reported history of thalassemia.  Chronic microcytic, iron deficiency anemia.  Has not yet seen a hematologist. Ran out of trueLance.  Has been taking Ducolax.  When she is constipated she has some anorectal bleeding from known grade 4 hemorrhoids.  Recent EGD and colonoscopy are good.  Celiac serologies negative.  Reflux symptoms well controlled on omeprazole 20 mg twice daily but she recently ran out.  Borderline low B12 level she takes B12 supplementation sporadically.   Past Medical History:  Diagnosis Date  . Allergy   . Anemia    thalcemia  . Anxiety   . Arthritis   . Cancer [facility])    Cervical cancer  . COPD (chronic obstructive pulmonary disease) ([place])   . Depression   . Dyspnea   . GERD (gastroesophageal reflux disease)   . Hyperlipidemia   . Hypothyroidism      Past Surgical History:  Procedure Laterality Date  . carpel tunnel release     bilaterally  . CESAREAN SECTION    . COLONOSCOPY N/A 06/22/2014   Dr. Tiara Maultsby:External and internal  hemorrhoids-grade 4; otherwise normal rectum total colon and terminal ileum.  . COLONOSCOPY WITH PROPOFOL N/A 11/26/2017   Procedure: COLONOSCOPY WITH PROPOFOL;  Surgeon: Daneil Dolin, MD;  [facility];  Service: Endoscopy;  Laterality: N/A;  9:45am  . COLPOSCOPY    . ESOPHAGOGASTRODUODENOSCOPY N/A 06/22/2014   Dr. Gala Romney:The mucosa of the esophagus appeared normal  Single gastric ulcer ranging between 3-5 mm in size was found   Biopsy with negative H.pylori.   . ESOPHAGOGASTRODUODENOSCOPY N/A 10/28/2014   Dr. Gala Romney: patulous EG junction, small hiatal hernia, previous noted gastric ulcer healed  . ESOPHAGOGASTRODUODENOSCOPY (EGD) WITH PROPOFOL N/A 11/26/2017   Procedure: ESOPHAGOGASTRODUODENOSCOPY (EGD) WITH PROPOFOL;  Surgeon: Daneil Dolin, MD;  [facility];  Service: Endoscopy;  Laterality: N/A;  . VAGINAL HYSTERECTOMY     bleeding     Current Meds  Medication Sig  . albuterol (PROVENTIL HFA;VENTOLIN HFA) 108 (90 Base) MCG/ACT inhaler Inhale 2 puffs into the lungs every 6 (six) hours as needed for wheezing or shortness of breath.  . benzonatate (TESSALON) 200 MG capsule Take 1 capsule (200 mg total) by mouth 2 (two) times daily as needed for cough.  . bisacodyl (DULCOLAX) 5 MG EC tablet Take 5 mg by mouth daily as needed for moderate constipation.  . calcium carbonate (OSCAL) 1500 (600 Ca) MG TABS tablet Take 1,500 mg by mouth daily with breakfast.  . desvenlafaxine (PRISTIQ) 50 MG 24 hr tablet Take 1 tablet (50  mg total) by mouth daily.  Marland Kitchen estradiol (ESTRACE) 1 MG tablet Take 1.5 mg by mouth daily.  . hydrocortisone (ANUSOL-HC) 2.5 % rectal cream Place 1 application rectally 2 (two) times daily. (Patient taking differently: Place 1 application rectally as needed. )  . IRON PO Take 27 mg by mouth 3 (three) times a week.   . lubiprostone (AMITIZA) 24 MCG capsule Take 1 capsule (24 mcg total) by mouth 2 (two) times daily with a meal. Take on a full stomach (Patient  taking differently: Take 24 mcg by mouth at bedtime as needed. Take on a full stomach)  . meclizine (ANTIVERT) 25 MG tablet Take 1 tablet (25 mg total) by mouth 3 (three) times daily as needed for dizziness.  . Multiple Vitamins-Minerals (MULTIVITAMIN WITH MINERALS) tablet Take 1 tablet by mouth daily.  . Omega-3 Fatty Acids (FISH OIL) 1000 MG CAPS Take by mouth daily.  Marland Kitchen omeprazole (PRILOSEC) 20 MG capsule Take 1 capsule (20 mg total) by mouth 2 (two) times daily before a meal. (Patient taking differently: Take 20 mg by mouth daily. )  . progesterone (PROMETRIUM) 200 MG capsule Take 200 mg by mouth at bedtime.  Marland Kitchen thyroid (ARMOUR) 60 MG tablet Take 90 mg by mouth daily before breakfast.       Review of Systems: Observations/Objective: No distress. Unable to perform physical exam due to telephone encounter. No video available.   Assessment and Plan: 58 year old lady with iron deficiency and anemia.  GI work-up as outlined above.  I think a video capsule endoscopy of small bowel would had relatively low yield at this time and is not recommended. GERD well-controlled on omeprazole 40 mg daily. Patient previously managed on trueLance but she ran out [place] 4 hemorrhoids currently asymptomatic.   Follow Up Instructions:  Resume omeprazole-40 mg tablet once daily dispense 90 with 3 refills.  Resume trueLance 3 mg daily.  Prescription and samples provided-dispense 90 with 3 refills  Continue B12 supplementation  Continue to recommend hematology evaluation for her reported diagnosis of thalassemia  Plan for face-to-face office visit in 6 months.    I discussed the assessment and treatment plan with the patient. The patient was provided an opportunity to ask questions and all were answered. The patient agreed with the plan and demonstrated an understanding of the instructions.   The patient was advised to call back or seek an in-person evaluation if the symptoms worsen or if the condition  fails to improve as anticipated.  I provided 13 minutes of non-face-to-face time during this encounter.  Annitta Needs, PhD, ANP-BC [facility]

## 2019-01-21 ENCOUNTER — Telehealth: Payer: Self-pay

## 2019-01-21 NOTE — Telephone Encounter (Signed)
PA for Trulance was done over the phone with [place] [employer]. Medication was approved. Approval [id number], interaction ID [id number] Abigail Butts).

## 2019-02-28 ENCOUNTER — Other Ambulatory Visit: Payer: Self-pay

## 2019-02-28 ENCOUNTER — Telehealth: Payer: Self-pay | Admitting: Internal Medicine

## 2019-02-28 MED ORDER — OMEPRAZOLE 40 MG PO CPDR: 40.0000 mg | DELAYED_RELEASE_CAPSULE | Freq: Every day | ORAL | 3 refills | Status: DC

## 2019-02-28 NOTE — Telephone Encounter (Signed)
Medication was sent to pts pharmacy. Pt is aware.

## 2019-02-28 NOTE — Telephone Encounter (Signed)
Pt called to say that she had seen RMR recently and he was going to call in a prescription of Omeprazole. She said that [facility] in [place] never received the prescription and she needed it called in. [phone]

## 2019-03-27 DIAGNOSIS — H66009 Acute suppurative otitis media without spontaneous rupture of ear drum, unspecified ear: Secondary | ICD-10-CM | POA: Diagnosis not present

## 2019-04-17 DIAGNOSIS — E039 Hypothyroidism, unspecified: Secondary | ICD-10-CM | POA: Diagnosis not present

## 2019-04-17 DIAGNOSIS — N951 Menopausal and female climacteric states: Secondary | ICD-10-CM | POA: Diagnosis not present

## 2019-04-17 DIAGNOSIS — G441 Vascular headache, not elsewhere classified: Secondary | ICD-10-CM | POA: Diagnosis not present

## 2019-04-17 DIAGNOSIS — D649 Anemia, unspecified: Secondary | ICD-10-CM | POA: Diagnosis not present

## 2019-04-17 DIAGNOSIS — Z780 Asymptomatic menopausal state: Secondary | ICD-10-CM | POA: Diagnosis not present

## 2019-05-28 ENCOUNTER — Telehealth (INDEPENDENT_AMBULATORY_CARE_PROVIDER_SITE_OTHER): Payer: Self-pay | Admitting: Internal Medicine

## 2019-05-28 ENCOUNTER — Other Ambulatory Visit (INDEPENDENT_AMBULATORY_CARE_PROVIDER_SITE_OTHER): Payer: Self-pay | Admitting: Internal Medicine

## 2019-05-28 DIAGNOSIS — N3000 Acute cystitis without hematuria: Secondary | ICD-10-CM

## 2019-05-28 MED ORDER — CIPROFLOXACIN HCL 250 MG PO TABS: 250.0000 mg | ORAL_TABLET | Freq: Two times a day (BID) | ORAL | 0 refills | Status: DC

## 2019-05-28 NOTE — Telephone Encounter (Signed)
Called patient and informed her of your recommendation.

## 2019-05-28 NOTE — Telephone Encounter (Signed)
Patient called with itching and burning in vaginal area and wanted to see if you could call something in for this?

## 2019-06-18 ENCOUNTER — Other Ambulatory Visit (INDEPENDENT_AMBULATORY_CARE_PROVIDER_SITE_OTHER): Payer: Self-pay | Admitting: Internal Medicine

## 2019-06-25 ENCOUNTER — Encounter: Payer: Self-pay | Admitting: Internal Medicine

## 2019-06-30 ENCOUNTER — Telehealth (INDEPENDENT_AMBULATORY_CARE_PROVIDER_SITE_OTHER): Payer: Self-pay

## 2019-06-30 NOTE — Telephone Encounter (Signed)
She should stop taking the NP thyroid for the time being until I see her.

## 2019-06-30 NOTE — Telephone Encounter (Signed)
Pt will be in on appt in a week; will do what you prefer until then. She said whatever is best.

## 2019-06-30 NOTE — Telephone Encounter (Signed)
Tell patient that we can use Armour Thyroid instead of the NP thyroid.  I can send this to the pharmacy if she wants.

## 2019-06-30 NOTE — Telephone Encounter (Signed)
reutn call; notified pt NOT to take anymore NP thyroid until seen in OV appt.

## 2019-07-08 ENCOUNTER — Ambulatory Visit (INDEPENDENT_AMBULATORY_CARE_PROVIDER_SITE_OTHER): Payer: Medicaid Other | Admitting: Internal Medicine

## 2019-07-10 ENCOUNTER — Encounter (INDEPENDENT_AMBULATORY_CARE_PROVIDER_SITE_OTHER): Payer: Self-pay | Admitting: Internal Medicine

## 2019-07-10 ENCOUNTER — Other Ambulatory Visit: Payer: Self-pay

## 2019-07-10 ENCOUNTER — Ambulatory Visit (INDEPENDENT_AMBULATORY_CARE_PROVIDER_SITE_OTHER): Payer: Medicaid Other | Admitting: Internal Medicine

## 2019-07-10 VITALS — BP 122/67 | HR 67 | Temp 97.9°F | Resp 18 | Ht 63.0 in | Wt 137.8 lb

## 2019-07-10 DIAGNOSIS — E782 Mixed hyperlipidemia: Secondary | ICD-10-CM

## 2019-07-10 DIAGNOSIS — H6693 Otitis media, unspecified, bilateral: Secondary | ICD-10-CM | POA: Diagnosis not present

## 2019-07-10 DIAGNOSIS — Z23 Encounter for immunization: Secondary | ICD-10-CM

## 2019-07-10 DIAGNOSIS — E039 Hypothyroidism, unspecified: Secondary | ICD-10-CM | POA: Diagnosis not present

## 2019-07-10 DIAGNOSIS — Z1231 Encounter for screening mammogram for malignant neoplasm of breast: Secondary | ICD-10-CM

## 2019-07-10 MED ORDER — THYROID 60 MG PO TABS: 60.0000 mg | ORAL_TABLET | Freq: Every day | ORAL | 3 refills | Status: DC

## 2019-07-10 MED ORDER — PROGESTERONE MICRONIZED 200 MG PO CAPS
400.0000 mg | ORAL_CAPSULE | Freq: Every evening | ORAL | 1 refills | Status: DC
Start: 2019-07-10 — End: 2020-02-02

## 2019-07-10 NOTE — Progress Notes (Signed)
Wellness Office Visit  Subjective:  Patient ID: Kathleen Erickson, female    DOB: [birth year]  Age: 58 y.o. MRN: [id number]  CC: This lady comes in for follow-up of hypothyroidism and bilateral ear pain and congestion. HPI Unfortunately, increasing the NP thyroid to  120 mg daily has not been tolerated and she is losing hair.  She did tolerate lower doses of NP thyroid previously. She also has bilateral ear pain and congestion.  She was supposed to be referred to ENT in the past but I do not think she ever got the appointment.  Past Medical History:  Diagnosis Date  . Allergy   . Anemia    thalcemia  . Anxiety   . Arthritis   . Cancer [facility])    Cervical cancer  . COPD (chronic obstructive pulmonary disease) ([place])   . Depression   . Dyspnea   . GERD (gastroesophageal reflux disease)   . Hyperlipidemia   . Hypothyroidism       Family History  Problem Relation Age of Onset  . Uterine cancer Mother   . Heart disease Mother   . Cancer Mother   . Alcohol abuse Mother   . Arthritis Mother   . COPD Mother   . Depression Mother   . Heart disease Father        age 32, family questions autopsy  . Alcohol abuse Father   . Arthritis Father   . Depression Father   . Early death Father   . Cancer Other        ulcer, aunt  . Lung cancer Cousin   . Throat cancer Cousin   . Asthma Son   . Colon cancer Neg Hx   . Ulcers Neg Hx     Social History   Social History Narrative   Lives at home with Kathleen Erickson   Visits sister - stays at home     Current Meds  Medication Sig  . albuterol (PROVENTIL HFA;VENTOLIN HFA) 108 (90 Base) MCG/ACT inhaler Inhale 2 puffs into the lungs every 6 (six) hours as needed for wheezing or shortness of breath.  . desvenlafaxine (PRISTIQ) 50 MG 24 hr tablet Take 1 tablet (50 mg total) by mouth daily.  Marland Kitchen estradiol (ESTRACE) 2 MG tablet Take 2 mg by mouth daily.   Marland Kitchen FISH OIL-VITAMIN D PO Take 1 tablet by mouth daily.  . IRON PO Take 27 mg by mouth 3  (three) times a week.   . meclizine (ANTIVERT) 25 MG tablet Take 1 tablet (25 mg total) by mouth 3 (three) times daily as needed for dizziness.  . Omega-3 Fatty Acids (FISH OIL) 1000 MG CAPS Take by mouth daily.  Marland Kitchen omeprazole (PRILOSEC) 20 MG capsule Take 1 capsule (20 mg total) by mouth 2 (two) times daily before a meal. (Patient taking differently: Take 20 mg by mouth daily. )  . progesterone (PROMETRIUM) 200 MG capsule Take 2 capsules (400 mg total) by mouth every evening.      Objective:   Today's Vitals: BP 122/67 (BP Location: Left Arm, Cuff Size: Normal)   Pulse 67   Temp 97.9 F (36.6 C)   Resp 18   Ht 5\' 3"  (1.6 m)   Wt 137 lb 12.8 oz (62.5 kg)   SpO2 98% Comment: room air w/mask on.  BMI 24.41 kg/m  Vitals with BMI 07/10/2019 08/09/2018 11/26/2017  Height 5\' 3"  5\' 1"  -  Weight 137 lbs 13 oz 138 lbs -  BMI 24.42 26.09 -  Systolic 123XX123 [id number]  Diastolic 67 63 61  Pulse 67 75 70     Physical Exam    She looks systemically well.  Examination of both ears shows some cloudiness in the left side.   Assessment   1. Acquired hypothyroidism   2. Mixed hyperlipidemia   3. Bilateral otitis media, unspecified otitis media type   4. Encounter for screening mammogram for malignant neoplasm of breast       Tests ordered Orders Placed This Encounter  Procedures  . MM 3D SCREEN BREAST BILATERAL  . Ambulatory referral to ENT     Plan: 1. She will take Zithromax for the otitis media but I will refer her to ENT. 2. I am going to send a new prescription for NP thyroid 60 mg tablet, take 1 daily to see if you tolerate this.  She has not been taking desiccated thyroid for the last 10 days or so. 3. I have refilled her progesterone which she needs.  She was given influenza vaccination today. 4. I will refer her to ENT and also we will refer her to radiology for mammogram. 5. Follow-up in about 3 months.   Meds ordered this encounter  Medications  . progesterone  (PROMETRIUM) 200 MG capsule    Sig: Take 2 capsules (400 mg total) by mouth every evening.    Dispense:  180 capsule    Refill:  1  . thyroid (NP THYROID) 60 MG tablet    Sig: Take 1 tablet (60 mg total) by mouth daily before breakfast.    Dispense:  30 tablet    Refill:  3    Nimish Luther Parody, MD

## 2019-07-13 ENCOUNTER — Other Ambulatory Visit (INDEPENDENT_AMBULATORY_CARE_PROVIDER_SITE_OTHER): Payer: Self-pay | Admitting: Internal Medicine

## 2019-07-28 ENCOUNTER — Other Ambulatory Visit: Payer: Self-pay

## 2019-07-28 ENCOUNTER — Ambulatory Visit (INDEPENDENT_AMBULATORY_CARE_PROVIDER_SITE_OTHER): Payer: Medicaid Other | Admitting: Internal Medicine

## 2019-07-28 ENCOUNTER — Ambulatory Visit (HOSPITAL_COMMUNITY)
Admission: RE | Admit: 2019-07-28 | Discharge: 2019-07-28 | Disposition: A | Discharge status: Home / Self Care | Payer: Medicaid Other | Source: Ambulatory Visit | Attending: Internal Medicine | Admitting: Internal Medicine

## 2019-07-28 DIAGNOSIS — Z1231 Encounter for screening mammogram for malignant neoplasm of breast: Secondary | ICD-10-CM

## 2019-08-04 ENCOUNTER — Other Ambulatory Visit (INDEPENDENT_AMBULATORY_CARE_PROVIDER_SITE_OTHER): Payer: Self-pay | Admitting: Internal Medicine

## 2019-08-29 ENCOUNTER — Other Ambulatory Visit: Payer: Self-pay

## 2019-08-29 DIAGNOSIS — Z20822 Contact with and (suspected) exposure to covid-19: Secondary | ICD-10-CM

## 2019-09-03 LAB — NOVEL CORONAVIRUS, NAA: SARS-CoV-2, NAA: NOT DETECTED

## 2019-10-09 ENCOUNTER — Encounter (INDEPENDENT_AMBULATORY_CARE_PROVIDER_SITE_OTHER): Payer: Self-pay | Admitting: Internal Medicine

## 2019-10-09 ENCOUNTER — Other Ambulatory Visit: Payer: Self-pay

## 2019-10-09 ENCOUNTER — Ambulatory Visit (INDEPENDENT_AMBULATORY_CARE_PROVIDER_SITE_OTHER): Payer: Medicaid Other | Admitting: Internal Medicine

## 2019-10-09 VITALS — BP 120/60 | HR 77 | Ht 61.0 in | Wt 141.0 lb

## 2019-10-09 DIAGNOSIS — E559 Vitamin D deficiency, unspecified: Secondary | ICD-10-CM

## 2019-10-09 DIAGNOSIS — E782 Mixed hyperlipidemia: Secondary | ICD-10-CM

## 2019-10-09 DIAGNOSIS — E039 Hypothyroidism, unspecified: Secondary | ICD-10-CM | POA: Diagnosis not present

## 2019-10-09 DIAGNOSIS — E2839 Other primary ovarian failure: Secondary | ICD-10-CM | POA: Diagnosis not present

## 2019-10-09 NOTE — Progress Notes (Signed)
Metrics: Intervention Frequency ACO  Documented Smoking Status Yearly  Screened one or more times in 24 months  Cessation Counseling or  Active cessation medication Past 24 months  Past 24 months   Guideline developer: UpToDate (See UpToDate for funding source) Date Released: 2014       Wellness Office Visit  Subjective:  Patient ID: Kathleen Erickson, female    DOB: [birth year]  Age: 59 y.o. MRN: [id number]  CC: This lady comes in for follow-up of COPD, hypothyroidism, hyperlipidemia, menopausal state and vitamin D deficiency. HPI  She is doing reasonably well but she is somewhat frustrated that she has gained weight. She continues with bioidentical hormone therapy without any problems.  She does not have side effects. She continues with desiccated NP thyroid for her hypothyroidism. She does have an albuterol inhaler when needed for her COPD but she is certainly not oxygen dependent and is doing well.  Unfortunately she does still continue to smoke a quarter of a pack of cigarettes per day. Past Medical History:  Diagnosis Date  . Allergy   . Anemia    thalcemia  . Anxiety   . Arthritis   . Cancer [facility])    Cervical cancer  . COPD (chronic obstructive pulmonary disease) ([place])   . Depression   . Dyspnea   . GERD (gastroesophageal reflux disease)   . Hyperlipidemia   . Hypothyroidism       Family History  Problem Relation Age of Onset  . Uterine cancer Mother   . Heart disease Mother   . Cancer Mother   . Alcohol abuse Mother   . Arthritis Mother   . COPD Mother   . Depression Mother   . Heart disease Father        age 68, family questions autopsy  . Alcohol abuse Father   . Arthritis Father   . Depression Father   . Early death Father   . Cancer Other        ulcer, aunt  . Lung cancer Cousin   . Throat cancer Cousin   . Asthma Son   . Colon cancer Neg Hx   . Ulcers Neg Hx     Social History   Social History Narrative   Lives at home with [name]   Visits sister - stays at home   Social History   Tobacco Use  . Smoking status: Current Every Day Smoker    Packs/day: 0.25    Years: 40.00    Pack years: 10.00    Start date: 09/25/1973  . Smokeless tobacco: Never Used  . Tobacco comment: one pack - 4 days  Substance Use Topics  . Alcohol use: No    Current Meds  Medication Sig  . albuterol (PROVENTIL HFA;VENTOLIN HFA) 108 (90 Base) MCG/ACT inhaler Inhale 2 puffs into the lungs every 6 (six) hours as needed for wheezing or shortness of breath.  . desvenlafaxine (PRISTIQ) 50 MG 24 hr tablet TAKE 1 TABLET BY MOUTH EVERY DAY  . estradiol (ESTRACE) 2 MG tablet TAKE 1 TABLET BY MOUTH EVERY DAY  . FISH OIL-VITAMIN D PO Take 1 tablet by mouth daily.  . IRON PO Take 27 mg by mouth 3 (three) times a week.   . Omega-3 Fatty Acids (FISH OIL) 1000 MG CAPS Take by mouth daily.  Marland Kitchen omeprazole (PRILOSEC) 20 MG capsule Take 1 capsule (20 mg total) by mouth 2 (two) times daily before a meal. (Patient taking differently: Take 20 mg by mouth daily. )  .  progesterone (PROMETRIUM) 200 MG capsule Take 2 capsules (400 mg total) by mouth every evening.  . thyroid (NP THYROID) 60 MG tablet Take 1 tablet (60 mg total) by mouth daily before breakfast.  . [DISCONTINUED] meclizine (ANTIVERT) 25 MG tablet Take 1 tablet (25 mg total) by mouth 3 (three) times daily as needed for dizziness.    Bio Identical Hormones  Micronized progesterone is being used in this patient for multiple benefits based on studies including protection against uterine cancer, breast cancer, osteoporosis and heart disease. The patient has been counseled regarding side effects, benefits and modes of administration. The patient is agreeable that this therapy is an integral part of her wellness, quality of life and prevention of chronic disease.  Estradiol is being used in this patient for multiple benefits based on several studies including protection against heart disease, cerebrovascular  disease, osteoporosis, colon cancer, Alzheimer's disease, macular degeneration and cataracts. The patient has been counseled regarding benefits and side effects and modes of administration. The patient is agreeable that this therapy is an integral to part of her wellness, quality of life and prevention of chronic disease.  Objective:   Today's Vitals: BP 120/60   Pulse 77   Ht '5\' 1"'$  (1.549 m)   Wt 141 lb (64 kg)   SpO2 98%   BMI 26.64 kg/m  Vitals with BMI 10/09/2019 07/10/2019 08/09/2018  Height '5\' 1"'$  '5\' 3"'$  '5\' 1"'$   Weight 141 lbs 137 lbs 13 oz 138 lbs  BMI 26.66 75.30 05.11  Systolic 021 117 356  Diastolic 60 67 63  Pulse 77 67 75     Physical Exam   She looks systemically well.  Blood pressure is well controlled.  She has gained about 4 pounds since her last visit.    Assessment   1. Acquired hypothyroidism   2. Mixed hyperlipidemia   3. Primary ovarian failure   4. Vitamin D deficiency disease       Tests ordered Orders Placed This Encounter  Procedures  . CMP with eGFR([facility])  . Vitamin D, 25-hydroxy  . T3, Free  . TSH  . Progesterone  . Estradiol     Plan: 1. Blood work is ordered as above. 2. She will continue with desiccated NP thyroid for hypothyroidism. 3. She will continue with bioidentical hormone therapy for menopausal state. 4. She will continue with vitamin D3 supplementation and I will check levels today. 5. Further recommendations will depend on blood results and I will see her in about 4 months time for follow-up.   No orders of the defined types were placed in this encounter.   Doree Albee, MD

## 2019-10-10 LAB — ESTRADIOL: Estradiol: 34 pg/mL

## 2019-10-10 LAB — COMPLETE METABOLIC PANEL WITH GFR
AG Ratio: 1.9 (calc) (ref 1.0–2.5)
ALT: 10 U/L (ref 6–29)
AST: 16 U/L (ref 10–35)
Albumin: 4.3 g/dL (ref 3.6–5.1)
Alkaline phosphatase (APISO): 58 U/L (ref 37–153)
BUN: 16 mg/dL (ref 7–25)
CO2: 26 mmol/L (ref 20–32)
Calcium: 9.1 mg/dL (ref 8.6–10.4)
Chloride: 104 mmol/L (ref 98–110)
Creat: 0.69 mg/dL (ref 0.50–0.99)
GFR, Est African American: 110 mL/min/{1.73_m2} (ref >=60)
GFR, Est Non African American: 95 mL/min/{1.73_m2} (ref >=60)
Globulin: 2.3 g/dL (calc) (ref 1.9–3.7)
Glucose, Bld: 87 mg/dL (ref 65–99)
Potassium: 4.3 mmol/L (ref 3.5–5.3)
Sodium: 139 mmol/L (ref 135–146)
Total Bilirubin: 0.6 mg/dL (ref 0.2–1.2)
Total Protein: 6.6 g/dL (ref 6.1–8.1)

## 2019-10-10 LAB — VITAMIN D 25 HYDROXY (VIT D DEFICIENCY, FRACTURES): Vit D, 25-Hydroxy: 21 ng/mL — ABNORMAL LOW (ref 30–100)

## 2019-10-10 LAB — PROGESTERONE: Progesterone: 22.9 ng/mL

## 2019-10-10 LAB — TSH: TSH: 12.93 mIU/L — ABNORMAL HIGH (ref 0.40–4.50)

## 2019-10-10 LAB — T3, FREE: T3, Free: 3.8 pg/mL (ref 2.3–4.2)

## 2019-10-13 ENCOUNTER — Ambulatory Visit (INDEPENDENT_AMBULATORY_CARE_PROVIDER_SITE_OTHER): Payer: Medicaid Other | Admitting: Internal Medicine

## 2019-10-16 DIAGNOSIS — J209 Acute bronchitis, unspecified: Secondary | ICD-10-CM | POA: Diagnosis not present

## 2019-10-16 DIAGNOSIS — J029 Acute pharyngitis, unspecified: Secondary | ICD-10-CM | POA: Diagnosis not present

## 2019-10-16 DIAGNOSIS — J329 Chronic sinusitis, unspecified: Secondary | ICD-10-CM | POA: Diagnosis not present

## 2019-10-16 DIAGNOSIS — J069 Acute upper respiratory infection, unspecified: Secondary | ICD-10-CM | POA: Diagnosis not present

## 2019-11-02 ENCOUNTER — Other Ambulatory Visit (INDEPENDENT_AMBULATORY_CARE_PROVIDER_SITE_OTHER): Payer: Self-pay | Admitting: Internal Medicine

## 2019-11-08 ENCOUNTER — Other Ambulatory Visit (INDEPENDENT_AMBULATORY_CARE_PROVIDER_SITE_OTHER): Payer: Self-pay | Admitting: Internal Medicine

## 2020-01-13 ENCOUNTER — Other Ambulatory Visit: Payer: Self-pay | Admitting: Internal Medicine

## 2020-02-02 ENCOUNTER — Other Ambulatory Visit (INDEPENDENT_AMBULATORY_CARE_PROVIDER_SITE_OTHER): Payer: Self-pay | Admitting: Internal Medicine

## 2020-02-04 ENCOUNTER — Other Ambulatory Visit (INDEPENDENT_AMBULATORY_CARE_PROVIDER_SITE_OTHER): Payer: Self-pay | Admitting: Internal Medicine

## 2020-02-09 ENCOUNTER — Telehealth (INDEPENDENT_AMBULATORY_CARE_PROVIDER_SITE_OTHER): Payer: Medicaid Other | Admitting: Internal Medicine

## 2020-02-09 ENCOUNTER — Encounter (INDEPENDENT_AMBULATORY_CARE_PROVIDER_SITE_OTHER): Payer: Self-pay | Admitting: Internal Medicine

## 2020-02-09 DIAGNOSIS — J069 Acute upper respiratory infection, unspecified: Secondary | ICD-10-CM

## 2020-02-09 MED ORDER — AZITHROMYCIN 250 MG PO TABS: ORAL_TABLET | ORAL | 0 refills | Status: DC

## 2020-02-09 NOTE — Progress Notes (Signed)
Metrics: Intervention Frequency ACO  Documented Smoking Status Yearly  Screened one or more times in 24 months  Cessation Counseling or  Active cessation medication Past 24 months  Past 24 months   Guideline developer: UpToDate (See UpToDate for funding source) Date Released: 2014       Wellness Office Visit  Subjective:  Patient ID: Kathleen Erickson, female    DOB: Oct 27, 1960  Age: 59 y.o. MRN: [id number]  CC: This patient had an office appointment but screening questions caused her to be sent back to her car.  I then performed a virtual phone visit. HPI  She describes feeling hot, slight runny nose but no sneezing cough or shortness of breath.  She has not been in contact with anyone with COVID-19 disease but she is also not had COVID-19 vaccination. She is also having vertigo. She thinks she is having a regular upper respiratory tract infection and usually Zithromax seem to help it. Past Medical History:  Diagnosis Date  . Allergy   . Anemia    thalcemia  . Anxiety   . Arthritis   . Cancer [facility])    Cervical cancer  . COPD (chronic obstructive pulmonary disease) ([place])   . Depression   . Dyspnea   . GERD (gastroesophageal reflux disease)   . Hyperlipidemia   . Hypothyroidism       Family History  Problem Relation Age of Onset  . Uterine cancer Mother   . Heart disease Mother   . Cancer Mother   . Alcohol abuse Mother   . Arthritis Mother   . COPD Mother   . Depression Mother   . Heart disease Father        age 6, family questions autopsy  . Alcohol abuse Father   . Arthritis Father   . Depression Father   . Early death Father   . Cancer Other        ulcer, aunt  . Lung cancer Cousin   . Throat cancer Cousin   . Asthma Son   . Colon cancer Neg Hx   . Ulcers Neg Hx     Social History   Social History Narrative   Lives at home with [name]   Visits sister - stays at home   Social History   Tobacco Use  . Smoking status: Current Every Day Smoker    Packs/day: 0.25    Years: 40.00    Pack years: 10.00    Start date: 09/25/1973  . Smokeless tobacco: Never Used  . Tobacco comment: one pack - 4 days  Substance Use Topics  . Alcohol use: No    Current Meds  Medication Sig  . albuterol (PROVENTIL HFA;VENTOLIN HFA) 108 (90 Base) MCG/ACT inhaler Inhale 2 puffs into the lungs every 6 (six) hours as needed for wheezing or shortness of breath.  Marland Kitchen azithromycin (ZITHROMAX) 250 MG tablet Take 2 tablets the first day and then 1 tablet every day for the next 4 days  . desvenlafaxine (PRISTIQ) 50 MG 24 hr tablet TAKE 1 TABLET BY MOUTH EVERY DAY  . FISH OIL-VITAMIN D PO Take 1 tablet by mouth daily.  . IRON PO Take 27 mg by mouth 3 (three) times a week.   . NP THYROID 60 MG tablet TAKE 1 TABLET BY MOUTH EVERY MORNING BEFORE BREAKFAST  . Omega-3 Fatty Acids (FISH OIL) 1000 MG CAPS Take by mouth daily.  Marland Kitchen omeprazole (PRILOSEC) 20 MG capsule Take 1 capsule (20 mg total) by mouth 2 (two)  times daily before a meal. (Patient taking differently: Take 20 mg by mouth daily. )  . omeprazole (PRILOSEC) 40 MG capsule TAKE 1 CAPSULE BY MOUTH EVERY DAY  . progesterone (PROMETRIUM) 200 MG capsule TAKE 2 CAPSULES (400 MG TOTAL) BY MOUTH EVERY EVENING.       Objective:   Today's Vitals: There were no vitals taken for this visit. Vitals with BMI 02/09/2020 10/09/2019 07/10/2019  Height (No Data) 5\' 1"  5\' 3"   Weight (No Data) 141 lbs 137 lbs 13 oz  BMI - [id number]  Systolic (No Data) 123456 123XX123  Diastolic (No Data) 60 67  Pulse - 77 67     Physical Exam  Her speech appears to be normal on the phone and she appears to be alert and orientated.     Assessment   1. Upper respiratory tract infection, unspecified type       Tests ordered No orders of the defined types were placed in this encounter.    Plan: 1. I have sent a prescription for Zithromax although this may be viral in origin but she feels that it may help her. 2. I have also encouraged  her to get COVID-19 testing and I have given her the phone number to call. 3. I also encouraged her to get COVID-19 vaccination if her test is negative. 4. I will see her in follow-up for her regular visit in 2 weeks time. 5. This phone call lasted 5 minutes and 6 seconds.   Meds ordered this encounter  Medications  . azithromycin (ZITHROMAX) 250 MG tablet    Sig: Take 2 tablets the first day and then 1 tablet every day for the next 4 days    Dispense:  6 tablet    Refill:  0    Taneil Lazarus Luther Parody, MD

## 2020-02-11 ENCOUNTER — Other Ambulatory Visit: Payer: Self-pay

## 2020-02-11 ENCOUNTER — Emergency Department (HOSPITAL_COMMUNITY)
Admission: EM | Admit: 2020-02-11 | Discharge: 2020-02-11 | Disposition: A | Discharge status: Home / Self Care | Payer: Medicaid Other | Attending: Emergency Medicine | Admitting: Emergency Medicine

## 2020-02-11 ENCOUNTER — Encounter (HOSPITAL_COMMUNITY): Payer: Self-pay | Admitting: Emergency Medicine

## 2020-02-11 ENCOUNTER — Emergency Department (HOSPITAL_COMMUNITY): Payer: Medicaid Other

## 2020-02-11 DIAGNOSIS — R531 Weakness: Secondary | ICD-10-CM

## 2020-02-11 DIAGNOSIS — E039 Hypothyroidism, unspecified: Secondary | ICD-10-CM | POA: Diagnosis not present

## 2020-02-11 DIAGNOSIS — J449 Chronic obstructive pulmonary disease, unspecified: Secondary | ICD-10-CM | POA: Diagnosis not present

## 2020-02-11 DIAGNOSIS — Z8541 Personal history of malignant neoplasm of cervix uteri: Secondary | ICD-10-CM | POA: Insufficient documentation

## 2020-02-11 DIAGNOSIS — R42 Dizziness and giddiness: Secondary | ICD-10-CM | POA: Insufficient documentation

## 2020-02-11 DIAGNOSIS — F17210 Nicotine dependence, cigarettes, uncomplicated: Secondary | ICD-10-CM | POA: Insufficient documentation

## 2020-02-11 DIAGNOSIS — R0602 Shortness of breath: Secondary | ICD-10-CM | POA: Diagnosis not present

## 2020-02-11 DIAGNOSIS — Z20822 Contact with and (suspected) exposure to covid-19: Secondary | ICD-10-CM | POA: Diagnosis not present

## 2020-02-11 DIAGNOSIS — J019 Acute sinusitis, unspecified: Secondary | ICD-10-CM | POA: Diagnosis not present

## 2020-02-11 DIAGNOSIS — I951 Orthostatic hypotension: Secondary | ICD-10-CM | POA: Diagnosis not present

## 2020-02-11 DIAGNOSIS — E860 Dehydration: Secondary | ICD-10-CM | POA: Diagnosis not present

## 2020-02-11 DIAGNOSIS — Z79899 Other long term (current) drug therapy: Secondary | ICD-10-CM | POA: Insufficient documentation

## 2020-02-11 DIAGNOSIS — J069 Acute upper respiratory infection, unspecified: Secondary | ICD-10-CM | POA: Diagnosis not present

## 2020-02-11 LAB — COMPREHENSIVE METABOLIC PANEL
ALT: 15 U/L (ref 0–44)
AST: 22 U/L (ref 15–41)
Albumin: 4.4 g/dL (ref 3.5–5.0)
Alkaline Phosphatase: 64 U/L (ref 38–126)
Anion gap: 9 (ref 5–15)
BUN: 14 mg/dL (ref 6–20)
CO2: 26 mmol/L (ref 22–32)
Calcium: 9 mg/dL (ref 8.9–10.3)
Chloride: 103 mmol/L (ref 98–111)
Creatinine, Ser: 0.6 mg/dL (ref 0.44–1.00)
GFR calc Af Amer: >60 mL/min (ref >60)
GFR calc non Af Amer: >60 mL/min (ref >60)
Glucose, Bld: 94 mg/dL (ref 70–99)
Potassium: 4.2 mmol/L (ref 3.5–5.1)
Sodium: 138 mmol/L (ref 135–145)
Total Bilirubin: 0.8 mg/dL (ref 0.3–1.2)
Total Protein: 7 g/dL (ref 6.5–8.1)

## 2020-02-11 LAB — URINALYSIS, ROUTINE W REFLEX MICROSCOPIC
Bilirubin Urine: NEGATIVE
Glucose, UA: NEGATIVE mg/dL
Hgb urine dipstick: NEGATIVE
Ketones, ur: NEGATIVE mg/dL
Leukocytes,Ua: NEGATIVE
Nitrite: NEGATIVE
Protein, ur: NEGATIVE mg/dL
Specific Gravity, Urine: 1.01 (ref 1.005–1.030)
pH: 5 (ref 5.0–8.0)

## 2020-02-11 LAB — CBC WITH DIFFERENTIAL/PLATELET
Abs Immature Granulocytes: 0.01 10*3/uL (ref 0.00–0.07)
Basophils Absolute: 0 10*3/uL (ref 0.0–0.1)
Basophils Relative: 1 %
Eosinophils Absolute: 0.1 10*3/uL (ref 0.0–0.5)
Eosinophils Relative: 2 %
HCT: 38 % (ref 36.0–46.0)
Hemoglobin: 11.3 g/dL — ABNORMAL LOW (ref 12.0–15.0)
Immature Granulocytes: 0 %
Lymphocytes Relative: 41 %
Lymphs Abs: 1.8 10*3/uL (ref 0.7–4.0)
MCH: 19.6 pg — ABNORMAL LOW (ref 26.0–34.0)
MCHC: 29.7 g/dL — ABNORMAL LOW (ref 30.0–36.0)
MCV: 66 fL — ABNORMAL LOW (ref 80.0–100.0)
Monocytes Absolute: 0.4 10*3/uL (ref 0.1–1.0)
Monocytes Relative: 8 %
Neutro Abs: 2 10*3/uL (ref 1.7–7.7)
Neutrophils Relative %: 48 %
Platelets: 252 10*3/uL (ref 150–400)
RBC: 5.76 MIL/uL — ABNORMAL HIGH (ref 3.87–5.11)
RDW: 15.6 % — ABNORMAL HIGH (ref 11.5–15.5)
WBC: 4.3 10*3/uL (ref 4.0–10.5)
nRBC: 0 % (ref 0.0–0.2)

## 2020-02-11 LAB — MAGNESIUM: Magnesium: 2.2 mg/dL (ref 1.7–2.4)

## 2020-02-11 LAB — ETHANOL: Alcohol, Ethyl (B): <10 mg/dL (ref <10)

## 2020-02-11 LAB — TSH: TSH: 18.686 u[IU]/mL — ABNORMAL HIGH (ref 0.350–4.500)

## 2020-02-11 LAB — SARS CORONAVIRUS 2 BY RT PCR (HOSPITAL ORDER, PERFORMED IN ~~LOC~~ HOSPITAL LAB): SARS Coronavirus 2: NEGATIVE

## 2020-02-11 LAB — T4, FREE: Free T4: 1.08 ng/dL (ref 0.61–1.12)

## 2020-02-11 MED ORDER — LEVOTHYROXINE SODIUM 25 MCG PO TABS
25.0000 ug | ORAL_TABLET | Freq: Every day | ORAL | 0 refills | Status: DC
Start: 2020-02-11 — End: 2020-03-08

## 2020-02-11 MED ORDER — LEVOTHYROXINE SODIUM 50 MCG PO TABS
25.0000 ug | ORAL_TABLET | Freq: Once | ORAL | Status: AC
  Administered 2020-02-11: 25 ug via ORAL
  Filled 2020-02-11: qty 1

## 2020-02-11 MED ORDER — SODIUM CHLORIDE 0.9 % IV BOLUS
1000.0000 mL | Freq: Once | INTRAVENOUS | Status: AC
  Administered 2020-02-11: 1000 mL via INTRAVENOUS

## 2020-02-11 NOTE — ED Provider Notes (Signed)
[facility] EMERGENCY DEPARTMENT Provider Note   CSN: [id number] Arrival date & time: 02/11/20  [id number]     History Chief Complaint  Patient presents with  . Dizziness    Kathleen Erickson is a 59 y.o. female.  HPI     Patient presents on referral from outpatient clinic with concern for weakness, dizziness. Onset was about 5 days ago, without clear precipitant. Since that time patient has had persistent generalized weakness, mild dizziness, no fever, though she does have some chills. No focal pain until yesterday, she bent over, and since that time has had some soreness in her left inferior costal region. Otherwise no chest pain, no headache, no other pain.  Patient saw her physician the day of symptom onset, was started on azithromycin.  She notes no changes since taking that medication. She also notes that she recently stopped her thyroid medication, believing it was contributing to alopecia. She does note interval weight gain.    Past Medical History:  Diagnosis Date  . Allergy   . Anemia    thalcemia  . Anxiety   . Arthritis   . Cancer [facility])    Cervical cancer  . COPD (chronic obstructive pulmonary disease) ([place])   . Depression   . Dyspnea   . GERD (gastroesophageal reflux disease)   . Hyperlipidemia   . Hypothyroidism     Patient Active Problem List   Diagnosis Date Noted  . Constipation 10/16/2017  . Thalassemia 07/17/2017  . 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 Cigarette nicotine dependence without complication [id number]  . Anxiety 12/28/2015  . Chronic pain 12/28/2015  . Depression 12/28/2015  . Adjustment disorder with mixed anxiety and depressed mood 11/07/2015  . PUD (peptic ulcer disease) 07/23/2014  . GERD (gastroesophageal reflux disease) 05/29/2014  . Microcytic anemia 05/29/2014  . Hyperlipidemia 01/20/2014  . Hypothyroidism 01/20/2014    Past Surgical History:  Procedure Laterality Date  . carpel tunnel release     bilaterally  . CESAREAN  SECTION    . COLONOSCOPY N/A 06/22/2014   Dr. Rourk:External and internal hemorrhoids-grade 4; otherwise normal rectum total colon and terminal ileum.  . COLONOSCOPY WITH PROPOFOL N/A 11/26/2017   Procedure: COLONOSCOPY WITH PROPOFOL;  Surgeon: Daneil Dolin, MD;  Location: [facility];  Service: Endoscopy;  Laterality: N/A;  9:45am  . COLPOSCOPY    . ESOPHAGOGASTRODUODENOSCOPY N/A 06/22/2014   Dr. Gala Romney:The mucosa of the esophagus appeared normal  Single gastric ulcer ranging between 3-5 mm in size was found   Biopsy with negative H.pylori.   . ESOPHAGOGASTRODUODENOSCOPY N/A 10/28/2014   Dr. Gala Romney: patulous EG junction, small hiatal hernia, previous noted gastric ulcer healed  . ESOPHAGOGASTRODUODENOSCOPY (EGD) WITH PROPOFOL N/A 11/26/2017   Procedure: ESOPHAGOGASTRODUODENOSCOPY (EGD) WITH PROPOFOL;  Surgeon: Daneil Dolin, MD;  Location: [facility] END[facility] SUITE;  Service: Endoscopy;  Laterality: N/A;  . VAGINAL HYSTERECTOMY     bleeding     OB History    Gravida      Para      Term      Preterm      AB      Living  2     SAB      TAB      Ectopic      Multiple      Live Births              Family History  Problem Relation Age of Onset  . Uterine cancer Mother   . Heart disease  Mother   . Cancer Mother   . Alcohol abuse Mother   . Arthritis Mother   . COPD Mother   . Depression Mother   . Heart disease Father        age 80, family questions autopsy  . Alcohol abuse Father   . Arthritis Father   . Depression Father   . Early death Father   . Cancer Other        ulcer, aunt  . Lung cancer Cousin   . Throat cancer Cousin   . Asthma Son   . Colon cancer Neg Hx   . Ulcers Neg Hx     Social History   Tobacco Use  . Smoking status: Current Every Day Smoker    Packs/day: 0.25    Years: 40.00    Pack years: 10.00    Start date: 09/25/1973  . Smokeless tobacco: Never Used  . Tobacco comment: one pack - 4 days  Substance Use Topics  . Alcohol use: No  . Drug  use: No    Home Medications Prior to Admission medications   Medication Sig Start Date End Date Taking? Authorizing Provider  albuterol (PROVENTIL HFA;VENTOLIN HFA) 108 (90 Base) MCG/ACT inhaler Inhale 2 puffs into the lungs every 6 (six) hours as needed for wheezing or shortness of breath. 01/31/17  Yes Soyla Dryer, PA-C  azithromycin (ZITHROMAX) 250 MG tablet Take 2 tablets the first day and then 1 tablet every day for the next 4 days 02/09/20  Yes Gosrani, Nimish C, MD  desvenlafaxine (PRISTIQ) 50 MG 24 hr tablet TAKE 1 TABLET BY MOUTH EVERY DAY 11/02/19  Yes Gosrani, Nimish C, MD  estradiol (ESTRACE) 2 MG tablet TAKE 1 TABLET BY MOUTH EVERY DAY 07/14/19 02/11/20 Yes Gosrani, Nimish C, MD  IRON PO Take 27 mg by mouth 3 (three) times a week.    Yes [provider]  NP THYROID 60 MG tablet TAKE 1 TABLET BY MOUTH EVERY MORNING BEFORE BREAKFAST Patient taking differently: Take 30 mg by mouth daily before breakfast.  02/04/20  Yes Gosrani, Nimish C, MD  omeprazole (PRILOSEC) 20 MG capsule Take 1 capsule (20 mg total) by mouth 2 (two) times daily before a meal. Patient taking differently: Take 20 mg by mouth daily.  07/17/17  Yes Raylene Everts, MD  progesterone (PROMETRIUM) 200 MG capsule TAKE 2 CAPSULES (400 MG TOTAL) BY MOUTH EVERY EVENING. 02/02/20  Yes Gosrani, Nimish C, MD  TRULANCE 3 MG TABS Take 1 tablet by mouth daily. 10/13/19  Yes [provider]  Omega-3 Fatty Acids (FISH OIL) 1000 MG CAPS Take by mouth daily.    [provider]  omeprazole (PRILOSEC) 40 MG capsule TAKE 1 CAPSULE BY MOUTH EVERY DAY Patient not taking: Reported on 02/11/2020 01/14/20   Carlis Stable, NP  desvenlafaxine (PRISTIQ) 50 MG 24 hr tablet TAKE 1 TABLET BY MOUTH EVERY DAY 08/04/19   Hurshel Party C, MD  estradiol (ESTRACE) 2 MG tablet Take 2 mg by mouth daily.     [provider]  progesterone (PROMETRIUM) 200 MG capsule Take 200 mg by mouth at bedtime.    [provider]  thyroid (NP THYROID) 60 MG tablet Take 1 tablet (60 mg total) by mouth daily before breakfast. 07/10/19   Doree Albee, MD    Allergies    Penicillins and Morphine and related  Review of Systems   Review of Systems  Constitutional:       Per HPI, otherwise  negative  HENT:       Per HPI, otherwise negative  Respiratory:       Per HPI, otherwise negative  Cardiovascular:       Per HPI, otherwise negative  Gastrointestinal: Negative for vomiting.  Endocrine:       Negative aside from HPI  Genitourinary:       Neg aside from HPI   Musculoskeletal:       Per HPI, otherwise negative  Skin: Negative.   Neurological: Positive for dizziness and weakness. Negative for syncope.    Physical Exam Updated Vital Signs BP 117/66   Pulse 72   Temp 98.1 F (36.7 C) (Oral)   Resp 16   Ht 5\' 1"  (1.549 m)   Wt 62.6 kg   SpO2 97%   BMI 26.07 kg/m   Physical Exam Vitals and nursing note reviewed.  Constitutional:      General: She is not in acute distress.    Appearance: She is well-developed.  HENT:     Head: Normocephalic and atraumatic.  Eyes:     Conjunctiva/sclera: Conjunctivae normal.  Cardiovascular:     Rate and Rhythm: Normal rate and regular rhythm.  Pulmonary:     Effort: Pulmonary effort is normal. No respiratory distress.     Breath sounds: Normal breath sounds. No stridor.  Abdominal:     General: There is no distension.  Skin:    General: Skin is warm and dry.  Neurological:     Mental Status: She is alert and oriented to person, place, and time.     Cranial Nerves: No cranial nerve deficit.     ED Results / Procedures / Treatments   Labs (all labs ordered are listed, but only abnormal results are displayed) Labs Reviewed  CBC WITH DIFFERENTIAL/PLATELET - Abnormal; Notable for the following components:      Result Value   RBC 5.76 (*)    Hemoglobin 11.3 (*)    MCV 66.0 (*)    MCH 19.6 (*)    MCHC 29.7 (*)    RDW 15.6 (*)    All other  components within normal limits  URINALYSIS, ROUTINE W REFLEX MICROSCOPIC - Abnormal; Notable for the following components:   APPearance HAZY (*)    All other components within normal limits  TSH - Abnormal; Notable for the following components:   TSH 18.686 (*)    All other components within normal limits  SARS CORONAVIRUS 2 BY RT PCR (HOSPITAL ORDER, [facility])  COMPREHENSIVE METABOLIC PANEL  ETHANOL  MAGNESIUM  T4, FREE    EKG EKG Interpretation  Date/Time:  Wednesday Feb 11 2020 10:29:16 EDT Ventricular Rate:  62 PR Interval:    QRS Duration: 108 QT Interval:  419 QTC Calculation: 426 R Axis:   70 Text Interpretation: Sinus rhythm Prolonged PR interval Nonspecific T abnormalities, lateral leads Abnormal ECG Confirmed by Carmin Muskrat [phone]) on 02/11/2020 10:32:28 AM   Radiology DG Chest Port 1 View  Result Date: 02/11/2020 CLINICAL DATA:  Shortness of breath and dizziness EXAM: PORTABLE CHEST 1 VIEW COMPARISON:  November 01, 2016 FINDINGS: Lungs are mildly hyperexpanded with scattered areas of mild scarring bilaterally. No edema or airspace opacity. Heart size and pulmonary vascularity within normal limits. No adenopathy. No bone lesions. IMPRESSION: Lungs mildly hyperexpanded with scattered areas of mild scarring. Lungs otherwise clear. Cardiac silhouette within normal limits. No evident adenopathy. Electronically Signed   By: Lowella Grip III M.D.   On: 02/11/2020  10:24    Procedures Procedures (including critical care time)  Medications Ordered in ED Medications  levothyroxine (SYNTHROID) tablet 25 mcg (has no administration in time range)  sodium chloride 0.9 % bolus 1,000 mL (0 mLs Intravenous Stopped 02/11/20 1113)    ED Course  I have reviewed the triage vital signs and the nursing notes.  Pertinent labs & imaging results that were available during my care of the patient were reviewed by me and considered in my medical decision  making (see chart for details).   1:40 PM Patient awake, alert, speaking clearly.  Vital signs notable for minimal hypotension, the patient has no tachycardia, no evidence for decompensated state. No evidence for infection, with generally reassuring x-ray, blood work.  No evidence for sustained arrhythmia. Patient is found to have substantially elevated TSH, and given her prior endorsement of requiring thyroid supplement, there are some suspicion for hypothyroidism contributing to her current fatigue, but without evidence for myxedema coma. After discussing this at length with the patient, patient will have reinitiation of thyroid supplement.  Patient is amenable to this starting today, with outpatient follow-up for repeat labs in the coming weeks. As above, with otherwise reassuring findings, patient is appropriate for, amenable to discharge with outpatient follow-up.   Final Clinical Impression(s) / ED Diagnoses Final diagnoses:  Weakness  Hypothyroidism, unspecified type    Rx / DC Orders ED Discharge Orders         Ordered    levothyroxine (SYNTHROID) 25 MCG tablet  Daily before breakfast     02/11/20 1345           Carmin Muskrat, MD 02/11/20 1345

## 2020-02-11 NOTE — ED Triage Notes (Signed)
PT states she started having dizziness with movement on 02/07/2020 and ringing in her ear and has had no relief from meclizine. PT states she saw her PCP on 02/09/20 and was started on zpack for a possible sinus infection but has had no relief.

## 2020-02-11 NOTE — Discharge Instructions (Addendum)
As discussed, today's evaluation has been generally reassuring. However, there is evidence that your thyroid hormone needs in repletion, and is important you take your newly prescribed medication.  Please be sure to schedule follow-up with your physician within the coming weeks for repeat lab studies, and follow-up care.  Return here for concerning changes in your condition.

## 2020-02-11 NOTE — ED Notes (Signed)
Patient had brief episode of dizziness when she went from laying to sitting.  Patient denies any dizziness from sitting to standing position.

## 2020-02-16 ENCOUNTER — Ambulatory Visit (HOSPITAL_COMMUNITY)
Admission: RE | Admit: 2020-02-16 | Discharge: 2020-02-16 | Disposition: A | Discharge status: Home / Self Care | Payer: Medicaid Other | Source: Ambulatory Visit | Attending: Nurse Practitioner | Admitting: Nurse Practitioner

## 2020-02-16 ENCOUNTER — Other Ambulatory Visit: Payer: Self-pay

## 2020-02-16 ENCOUNTER — Encounter (INDEPENDENT_AMBULATORY_CARE_PROVIDER_SITE_OTHER): Payer: Self-pay | Admitting: Nurse Practitioner

## 2020-02-16 ENCOUNTER — Ambulatory Visit (INDEPENDENT_AMBULATORY_CARE_PROVIDER_SITE_OTHER): Payer: Medicaid Other | Admitting: Nurse Practitioner

## 2020-02-16 ENCOUNTER — Encounter: Payer: Self-pay | Admitting: Internal Medicine

## 2020-02-16 VITALS — BP 90/70 | HR 77 | Temp 97.2°F | Ht 61.0 in | Wt 144.2 lb

## 2020-02-16 DIAGNOSIS — H9313 Tinnitus, bilateral: Secondary | ICD-10-CM | POA: Diagnosis not present

## 2020-02-16 DIAGNOSIS — R519 Headache, unspecified: Secondary | ICD-10-CM | POA: Diagnosis not present

## 2020-02-16 DIAGNOSIS — M25512 Pain in left shoulder: Secondary | ICD-10-CM | POA: Diagnosis not present

## 2020-02-16 DIAGNOSIS — N644 Mastodynia: Secondary | ICD-10-CM | POA: Diagnosis not present

## 2020-02-16 DIAGNOSIS — R42 Dizziness and giddiness: Secondary | ICD-10-CM | POA: Diagnosis not present

## 2020-02-16 DIAGNOSIS — H6692 Otitis media, unspecified, left ear: Secondary | ICD-10-CM

## 2020-02-16 DIAGNOSIS — M546 Pain in thoracic spine: Secondary | ICD-10-CM | POA: Diagnosis not present

## 2020-02-16 DIAGNOSIS — H539 Unspecified visual disturbance: Secondary | ICD-10-CM

## 2020-02-16 MED ORDER — CYCLOBENZAPRINE HCL 5 MG PO TABS: 5.0000 mg | ORAL_TABLET | Freq: Three times a day (TID) | ORAL | 1 refills | Status: DC | PRN

## 2020-02-16 MED ORDER — LEVOFLOXACIN 500 MG PO TABS: 500.0000 mg | ORAL_TABLET | Freq: Every day | ORAL | 0 refills | Status: DC

## 2020-02-16 MED ORDER — PREDNISONE 20 MG PO TABS: 20.0000 mg | ORAL_TABLET | Freq: Every day | ORAL | 0 refills | Status: DC

## 2020-02-16 NOTE — Progress Notes (Addendum)
Subjective:  Patient ID: Kathleen Erickson, female    DOB: 1960-10-13  Age: 59 y.o. MRN: [id number]  CC:  Chief Complaint  Patient presents with  . Dizziness  . Tinnitus    left mostly  . Headache    off & on for a month  . Shoulder Pain    left side that radiates up the neck and down into the shoulder blade   . Other    left breast pain      HPI  This patient comes in today for an acute visit for the above.  Headache: This is been going on for approximately 1 month.  She tells me it is bilateral, intermittent.  Intensity is 7/10.  She has taken Tylenol this is helped mildly.  She is also been experiencing tinnitus, dizziness, visual changes which will resolve spontaneously within a few minutes.  She tells me she experiencing tunnel vision as well as blurry vision intermittently.  She denies seeing double.  She is also been experiencing fatigue.   Left shoulder pain: She is been having shoulder pain for approximately 1 month.  Pain is constant and she describes it as sharp and achy.  It is a 7-8 out of 10.  She has no experienced any kind of traumatic injury that she can think of.  She has tried taking Tylenol and this results in mild pain relief.  She is not had any weakness in her arm but does experience some numbness in the arm especially when she flexes her elbow.  Left breast pain: He is also been experiencing some left breast pain this is concerned her.  Last mammogram was completed back in November 2020, and this was negative.  She has not felt a mass on her own.   Past Medical History:  Diagnosis Date  . Allergy   . Anemia    thalcemia  . Anxiety   . Arthritis   . Cancer [facility])    Cervical cancer  . COPD (chronic obstructive pulmonary disease) ([place])   . Depression   . Dyspnea   . GERD (gastroesophageal reflux disease)   . Hyperlipidemia   . Hypothyroidism       Family History  Problem Relation Age of Onset  . Uterine cancer Mother   . Heart disease  Mother   . Cancer Mother   . Alcohol abuse Mother   . Arthritis Mother   . COPD Mother   . Depression Mother   . Heart disease Father        age 52, family questions autopsy  . Alcohol abuse Father   . Arthritis Father   . Depression Father   . Early death Father   . Cancer Other        ulcer, aunt  . Lung cancer Cousin   . Throat cancer Cousin   . Asthma Son   . Colon cancer Neg Hx   . Ulcers Neg Hx     Social History   Social History Narrative   Lives at home with [name]   Visits sister - stays at home   Social History   Tobacco Use  . Smoking status: Current Every Day Smoker    Packs/day: 0.25    Years: 40.00    Pack years: 10.00    Start date: 09/25/1973  . Smokeless tobacco: Never Used  . Tobacco comment: one pack - 4 days  Substance Use Topics  . Alcohol use: No     Current  Meds  Medication Sig  . albuterol (PROVENTIL HFA;VENTOLIN HFA) 108 (90 Base) MCG/ACT inhaler Inhale 2 puffs into the lungs every 6 (six) hours as needed for wheezing or shortness of breath.  . Biotin 10000 MCG TABS Take 10,000 Units by mouth daily.  Marland Kitchen desvenlafaxine (PRISTIQ) 50 MG 24 hr tablet TAKE 1 TABLET BY MOUTH EVERY DAY  . IRON PO Take 27 mg by mouth 3 (three) times a week.   . levothyroxine (SYNTHROID) 25 MCG tablet Take 1 tablet (25 mcg total) by mouth daily before breakfast.  . Omega-3 Fatty Acids (FISH OIL) 1000 MG CAPS Take by mouth daily.  Marland Kitchen omeprazole (PRILOSEC) 40 MG capsule TAKE 1 CAPSULE BY MOUTH EVERY DAY  . TRULANCE 3 MG TABS Take 1 tablet by mouth daily.    ROS:  Negative unless otherwise stated in HPI   Objective:   Today's Vitals: BP 90/70 (BP Location: Left Arm, Cuff Size: Normal)   Pulse 77   Temp (!) 97.2 F (36.2 C) (Temporal)   Ht 5\' 1"  (1.549 m)   Wt 144 lb 3.2 oz (65.4 kg)   SpO2 98%   BMI 27.25 kg/m  Vitals with BMI 02/16/2020 02/11/2020 02/11/2020  Height 5\' 1"  - -  Weight 144 lbs 3 oz - -  BMI 0000000 - -  Systolic 90 [id number]  Diastolic 70 66  66  Pulse 77 72 69     Physical Exam Vitals reviewed.  HENT:     Right Ear: Hearing, ear canal and external ear normal. Tympanic membrane is not erythematous, retracted or bulging.     Left Ear: Hearing and external ear normal. Tympanic membrane is erythematous. Tympanic membrane is not perforated, retracted or bulging.  Chest:     Breasts:        Right: Normal.        Left: Tenderness present. No swelling or mass.  Musculoskeletal:     Left shoulder: Bony tenderness present. No swelling, deformity, effusion, tenderness or crepitus. Decreased range of motion.     Thoracic back: Bony tenderness present.  Lymphadenopathy:     Upper Body:     Left upper body: No supraclavicular adenopathy.  Neurological:     General: No focal deficit present.     Mental Status: She is alert and oriented to person, place, and time.     Cranial Nerves: Cranial nerves are intact.     Sensory: Sensory deficit (she reports feeling numbness to left arm) present.     Motor: Motor function is intact.     Coordination: Coordination is intact.     Gait: Gait is intact.     EKG: Sinus rhythm with first-degree AV block; Reviewed with Dr. Anastasio Champion   Assessment and Plan   1. Acute pain of left shoulder   2. Left otitis media, unspecified otitis media type   3. Acute nonintractable headache, unspecified headache type   4. Dizziness   5. Tinnitus of both ears   6. Breast pain, left   7. Thoracic spine pain   8. Visual changes      Plan: 1.  I am going to send her for x-ray of the left shoulder in the meantime I will prescribe her a course of prednisone as well as muscle relaxers that she can take as needed in addition to her as needed Tylenol.  I did warn her to not drive while taking muscle relaxer and to also avoid operating any heavy machinery, she tells me she understands.  2.  On exam I did see that she probably has an ear infection to her left ear this may be what is causing the tinnitus, I will  prescribe her a course of antibiotics.  Due to her list of allergies I am going to prescribe her levofloxacin and I did warn her of risk of tendon rupture and that if she were experiencing joint pain will take this medication she should stop and let us know.  She tells me she understands.  3.,  4, 5.,  8.  I am going to send her for imaging of the brain for further evaluation.  6.  I will send her for mammogram for further evaluation of her breast pain  7.  I am going to send her for imaging of her thoracic spine as she did have bony tenderness on exam for further evaluation.  Tests ordered Orders Placed This Encounter  Procedures  . DG Shoulder Left      Meds ordered this encounter  Medications  . predniSONE (DELTASONE) 20 MG tablet    Sig: Take 1 tablet (20 mg total) by mouth daily with breakfast.    Dispense:  5 tablet    Refill:  0    Order Specific Question:   Supervising Provider    Answer:   Hurshel Party C [id number]  . cyclobenzaprine (FLEXERIL) 5 MG tablet    Sig: Take 1 tablet (5 mg total) by mouth 3 (three) times daily as needed for muscle spasms.    Dispense:  30 tablet    Refill:  1    Order Specific Question:   Supervising Provider    Answer:   Hurshel Party C [id number]  . levofloxacin (LEVAQUIN) 500 MG tablet    Sig: Take 1 tablet (500 mg total) by mouth daily.    Dispense:  7 tablet    Refill:  0    Order Specific Question:   Supervising Provider    Answer:   Doree Albee [id number]    Patient to follow-up in 1 week  Ailene Ards, NP

## 2020-02-17 ENCOUNTER — Telehealth (INDEPENDENT_AMBULATORY_CARE_PROVIDER_SITE_OTHER): Payer: Self-pay

## 2020-02-17 NOTE — Telephone Encounter (Signed)
Called pt Mammo is scheduled at [facility] check in at 9:45am on 03/09/20.  LMOM for patient -   She can not be scanned at [facility] due to dx follow up.

## 2020-02-17 NOTE — Addendum Note (Signed)
Addended by: Ailene Ards on: 02/17/2020 10:33 AM   Modules accepted: Orders

## 2020-02-18 ENCOUNTER — Other Ambulatory Visit (INDEPENDENT_AMBULATORY_CARE_PROVIDER_SITE_OTHER): Payer: Self-pay | Admitting: Nurse Practitioner

## 2020-02-18 ENCOUNTER — Other Ambulatory Visit: Payer: Self-pay

## 2020-02-18 ENCOUNTER — Ambulatory Visit (HOSPITAL_COMMUNITY)
Admission: RE | Admit: 2020-02-18 | Discharge: 2020-02-18 | Disposition: A | Discharge status: Home / Self Care | Payer: Medicaid Other | Source: Ambulatory Visit | Attending: Nurse Practitioner | Admitting: Nurse Practitioner

## 2020-02-18 DIAGNOSIS — M546 Pain in thoracic spine: Secondary | ICD-10-CM | POA: Insufficient documentation

## 2020-02-18 DIAGNOSIS — R42 Dizziness and giddiness: Secondary | ICD-10-CM

## 2020-02-18 DIAGNOSIS — H9313 Tinnitus, bilateral: Secondary | ICD-10-CM

## 2020-02-18 DIAGNOSIS — R519 Headache, unspecified: Secondary | ICD-10-CM

## 2020-02-18 DIAGNOSIS — M47814 Spondylosis without myelopathy or radiculopathy, thoracic region: Secondary | ICD-10-CM | POA: Diagnosis not present

## 2020-02-18 NOTE — Progress Notes (Signed)
Kathleen Erickson, I have updated the imaging orders per insurance recommendations. I will try and cancel the original orders. Please notify the patient of the changes. She should be able to go to [facility] as walk-in to do the back xray, and then we will see if insurance will approve a head CT without contrast.

## 2020-02-18 NOTE — Addendum Note (Signed)
Addended by: Ailene Ards on: 02/18/2020 03:41 PM   Modules accepted: Orders

## 2020-02-18 NOTE — Progress Notes (Signed)
Pt was called and notified. She will go up and do x ray today. Be on look out for results.

## 2020-02-24 ENCOUNTER — Encounter (INDEPENDENT_AMBULATORY_CARE_PROVIDER_SITE_OTHER): Payer: Self-pay | Admitting: Internal Medicine

## 2020-02-24 ENCOUNTER — Other Ambulatory Visit (INDEPENDENT_AMBULATORY_CARE_PROVIDER_SITE_OTHER): Payer: Self-pay | Admitting: Internal Medicine

## 2020-02-24 ENCOUNTER — Other Ambulatory Visit: Payer: Self-pay

## 2020-02-24 ENCOUNTER — Ambulatory Visit (INDEPENDENT_AMBULATORY_CARE_PROVIDER_SITE_OTHER): Payer: Medicaid Other | Admitting: Internal Medicine

## 2020-02-24 VITALS — BP 130/79 | HR 139 | Temp 97.5°F | Ht 61.0 in | Wt 145.4 lb

## 2020-02-24 DIAGNOSIS — R079 Chest pain, unspecified: Secondary | ICD-10-CM

## 2020-02-24 DIAGNOSIS — R251 Tremor, unspecified: Secondary | ICD-10-CM

## 2020-02-24 DIAGNOSIS — L659 Nonscarring hair loss, unspecified: Secondary | ICD-10-CM | POA: Diagnosis not present

## 2020-02-24 DIAGNOSIS — R42 Dizziness and giddiness: Secondary | ICD-10-CM | POA: Diagnosis not present

## 2020-02-24 DIAGNOSIS — E039 Hypothyroidism, unspecified: Secondary | ICD-10-CM

## 2020-02-24 MED ORDER — DESVENLAFAXINE SUCCINATE ER 50 MG PO TB24: 50.0000 mg | ORAL_TABLET | Freq: Every day | ORAL | 0 refills | Status: DC

## 2020-02-24 MED ORDER — LEVOTHYROXINE SODIUM 50 MCG PO TABS: 50.0000 ug | ORAL_TABLET | Freq: Every day | ORAL | 1 refills | Status: DC

## 2020-02-24 NOTE — Progress Notes (Signed)
Metrics: Intervention Frequency ACO  Documented Smoking Status Yearly  Screened one or more times in 24 months  Cessation Counseling or  Active cessation medication Past 24 months  Past 24 months   Guideline developer: UpToDate (See UpToDate for funding source) Date Released: 2014       Wellness Office Visit  Subjective:  Patient ID: Kathleen Erickson, female    DOB: [birth year]  Age: 59 y.o. MRN: [id number]  CC: This lady came in today with several complaints which are described below. HPI  She continues to have the tinnitus although the dizziness is slightly better and helped by meclizine.  She thinks that the antibiotics did help her overall but she is still not improved and her symptoms have lasted now about 1 month. She also describes left shoulder pain which seems to be improved but now she is getting right shoulder pain.  Together with this, she seems to describe central chest pain which is sharp in nature but she is worried about heart disease. She is also complaining of hair loss which she has had over the last 1 year.  She was taking desiccated thyroid but she did not tolerate this.  In the meantime, she is now taking levothyroxine.  I see that when she was seen in the emergency room a couple of weeks ago, TSH level was elevated.  She is only taking levothyroxine 25 mcg daily. Past Medical History:  Diagnosis Date  . Allergy   . Anemia    thalcemia  . Anxiety   . Arthritis   . Cancer [facility])    Cervical cancer  . COPD (chronic obstructive pulmonary disease) ([place])   . Depression   . Dyspnea   . GERD (gastroesophageal reflux disease)   . Hyperlipidemia   . Hypothyroidism    Past Surgical History:  Procedure Laterality Date  . carpel tunnel release     bilaterally  . CESAREAN SECTION    . COLONOSCOPY N/A 06/22/2014   Dr. Rourk:External and internal hemorrhoids-grade 4; otherwise normal rectum total colon and terminal ileum.  . COLONOSCOPY WITH PROPOFOL N/A 11/26/2017   Procedure: COLONOSCOPY WITH PROPOFOL;  Surgeon: Daneil Dolin, MD;  [facility];  Service: Endoscopy;  Laterality: N/A;  9:45am  . COLPOSCOPY    . ESOPHAGOGASTRODUODENOSCOPY N/A 06/22/2014   Dr. Gala Romney:The mucosa of the esophagus appeared normal  Single gastric ulcer ranging between 3-5 mm in size was found   Biopsy with negative H.pylori.   . ESOPHAGOGASTRODUODENOSCOPY N/A 10/28/2014   Dr. Gala Romney: patulous EG junction, small hiatal hernia, previous noted gastric ulcer healed  . ESOPHAGOGASTRODUODENOSCOPY (EGD) WITH PROPOFOL N/A 11/26/2017   Procedure: ESOPHAGOGASTRODUODENOSCOPY (EGD) WITH PROPOFOL;  Surgeon: Daneil Dolin, MD;  [facility];  Service: Endoscopy;  Laterality: N/A;  . VAGINAL HYSTERECTOMY     bleeding     Family History  Problem Relation Age of Onset  . Uterine cancer Mother   . Heart disease Mother   . Cancer Mother   . Alcohol abuse Mother   . Arthritis Mother   . COPD Mother   . Depression Mother   . Heart disease Father        age 66, family questions autopsy  . Alcohol abuse Father   . Arthritis Father   . Depression Father   . Early death Father   . Cancer Other        ulcer, aunt  . Lung cancer Cousin   . Throat cancer Cousin   .  Asthma Son   . Colon cancer Neg Hx   . Ulcers Neg Hx     Social History   Social History Narrative   Lives at home with [name]   Visits sister - stays at home   Social History   Tobacco Use  . Smoking status: Current Every Day Smoker    Packs/day: 0.25    Years: 40.00    Pack years: 10.00    Start date: 09/25/1973  . Smokeless tobacco: Never Used  . Tobacco comment: one pack - 4 days  Substance Use Topics  . Alcohol use: No    Current Meds  Medication Sig  . albuterol (PROVENTIL HFA;VENTOLIN HFA) 108 (90 Base) MCG/ACT inhaler Inhale 2 puffs into the lungs every 6 (six) hours as needed for wheezing or shortness of breath.  . Biotin 10000 MCG TABS Take 10,000 Units by mouth daily.  .  cyclobenzaprine (FLEXERIL) 5 MG tablet Take 1 tablet (5 mg total) by mouth 3 (three) times daily as needed for muscle spasms.  Marland Kitchen estradiol (ESTRACE) 2 MG tablet TAKE 1 TABLET BY MOUTH EVERY DAY  . levothyroxine (SYNTHROID) 25 MCG tablet Take 1 tablet (25 mcg total) by mouth daily before breakfast.  . Omega-3 Fatty Acids (FISH OIL) 1000 MG CAPS Take by mouth daily.  Marland Kitchen omeprazole (PRILOSEC) 40 MG capsule TAKE 1 CAPSULE BY MOUTH EVERY DAY  . TRULANCE 3 MG TABS Take 1 tablet by mouth daily.  . [DISCONTINUED] IRON PO Take 27 mg by mouth 3 (three) times a week.   . [DISCONTINUED] progesterone (PROMETRIUM) 200 MG capsule Take 200 mg by mouth at bedtime.       Depression screen [facility] 2/9 02/24/2020 11/22/2017 08/14/2017 07/17/2017  Decreased Interest 0 0 0 0  Down, Depressed, Hopeless 0 0 0 0  PHQ - 2 Score 0 0 0 0     Objective:   Today's Vitals: BP 130/79 (BP Location: Left Arm, Patient Position: Sitting, Cuff Size: Normal)   Pulse (!) 139   Temp (!) 97.5 F (36.4 C) (Temporal)   Ht 5\' 1"  (1.549 m)   Wt 145 lb 6.4 oz (66 kg)   SpO2 92%   BMI 27.47 kg/m  Vitals with BMI 02/24/2020 02/16/2020 02/11/2020  Height 5\' 1"  5\' 1"  -  Weight 145 lbs 6 oz 144 lbs 3 oz -  BMI [id number] 0000000 -  Systolic [id number]  Diastolic 79 70 66  Pulse [id number] 77 72     Physical Exam  She looks systemically well.  Weight is stable.  Blood pressure stable.  I examined her for cerebellar signs and she does appear to have a tremor on finger-to-nose test and I am somewhat concerned about cerebellar problems.  We did request a CT head scan but this was apparently denied.     Assessment   1. Dizziness   2. Acquired hypothyroidism   3. Tremor, unspecified   4. Hair loss   5. Chest pain, unspecified type   6. Intracranial nontraumatic hemorrhage of newborn       Tests ordered Orders Placed This Encounter  Procedures  . CT Head Wo Contrast  . Ambulatory referral to Cardiology  . Ambulatory referral to  Dermatology     Plan: 1. As far as the dizziness is concerned as well as the abnormal tremor and possible cerebellar signs, I am going to try to get a CT brain scan again and hopefully the insurance will approve it. 2. As far as her  hypothyroid is concerned, I told her to increase the dose of levothyroxine to 50 mcg daily and I have sent a new prescription for this.  She will double up on the current dose. 3. I will refer to dermatology for the hair loss. 4. I will refer to cardiology for the chest pain. 5. Follow-up for close monitoring in 2 weeks time.   Meds ordered this encounter  Medications  . levothyroxine (SYNTHROID) 50 MCG tablet    Sig: Take 1 tablet (50 mcg total) by mouth daily.    Dispense:  90 tablet    Refill:  1  . desvenlafaxine (PRISTIQ) 50 MG 24 hr tablet    Sig: Take 1 tablet (50 mg total) by mouth daily.    Dispense:  90 tablet    Refill:  0    Venia Riveron Luther Parody, MD

## 2020-02-25 ENCOUNTER — Other Ambulatory Visit (HOSPITAL_COMMUNITY): Payer: Self-pay | Admitting: Nurse Practitioner

## 2020-02-25 DIAGNOSIS — N644 Mastodynia: Secondary | ICD-10-CM

## 2020-02-27 ENCOUNTER — Other Ambulatory Visit (INDEPENDENT_AMBULATORY_CARE_PROVIDER_SITE_OTHER): Payer: Self-pay | Admitting: Internal Medicine

## 2020-03-08 ENCOUNTER — Encounter (INDEPENDENT_AMBULATORY_CARE_PROVIDER_SITE_OTHER): Payer: Self-pay | Admitting: Internal Medicine

## 2020-03-08 ENCOUNTER — Ambulatory Visit (INDEPENDENT_AMBULATORY_CARE_PROVIDER_SITE_OTHER): Payer: Medicaid Other | Admitting: Cardiology

## 2020-03-08 ENCOUNTER — Ambulatory Visit (INDEPENDENT_AMBULATORY_CARE_PROVIDER_SITE_OTHER): Payer: Medicaid Other | Admitting: Internal Medicine

## 2020-03-08 ENCOUNTER — Encounter: Payer: Self-pay | Admitting: *Deleted

## 2020-03-08 ENCOUNTER — Encounter: Payer: Self-pay | Admitting: Cardiology

## 2020-03-08 ENCOUNTER — Other Ambulatory Visit: Payer: Self-pay

## 2020-03-08 VITALS — BP 130/64 | HR 64 | Ht 61.0 in | Wt 144.0 lb

## 2020-03-08 VITALS — BP 120/68 | HR 71 | Ht 61.0 in | Wt 144.0 lb

## 2020-03-08 DIAGNOSIS — R42 Dizziness and giddiness: Secondary | ICD-10-CM | POA: Diagnosis not present

## 2020-03-08 DIAGNOSIS — R0789 Other chest pain: Secondary | ICD-10-CM | POA: Diagnosis not present

## 2020-03-08 DIAGNOSIS — R079 Chest pain, unspecified: Secondary | ICD-10-CM

## 2020-03-08 DIAGNOSIS — E039 Hypothyroidism, unspecified: Secondary | ICD-10-CM

## 2020-03-08 NOTE — Progress Notes (Signed)
Metrics: Intervention Frequency ACO  Documented Smoking Status Yearly  Screened one or more times in 24 months  Cessation Counseling or  Active cessation medication Past 24 months  Past 24 months   Guideline developer: UpToDate (See UpToDate for funding source) Date Released: 2014       Wellness Office Visit  Subjective:  Patient ID: Kathleen Erickson, female    DOB: [birth year]  Age: 59 y.o. MRN: [id number]  CC: This lady comes in for close follow-up regarding her dizziness, change in thyroid dose and tremor. HPI  She also reported chest pain on the last visit and she is going to see the cardiologist today. Her CT scan of the head has been improved but she has not gotten a date for the actual test. She says that she has no adverse side effects from increasing the thyroid dose. She says that her dizziness is slightly better but still present.  She admits that she is not drinking enough water/fluids every day. Past Medical History:  Diagnosis Date  . Allergy   . Anemia    thalcemia  . Anxiety   . Arthritis   . Cancer [facility])    Cervical cancer  . COPD (chronic obstructive pulmonary disease) ([place])   . Depression   . Dyspnea   . GERD (gastroesophageal reflux disease)   . Hyperlipidemia   . Hypothyroidism    Past Surgical History:  Procedure Laterality Date  . carpel tunnel release     bilaterally  . CESAREAN SECTION    . COLONOSCOPY N/A 06/22/2014   Dr. Rourk:External and internal hemorrhoids-grade 4; otherwise normal rectum total colon and terminal ileum.  . COLONOSCOPY WITH PROPOFOL N/A 11/26/2017   Procedure: COLONOSCOPY WITH PROPOFOL;  Surgeon: Daneil Dolin, MD;  [facility];  Service: Endoscopy;  Laterality: N/A;  9:45am  . COLPOSCOPY    . ESOPHAGOGASTRODUODENOSCOPY N/A 06/22/2014   Dr. Gala Romney:The mucosa of the esophagus appeared normal  Single gastric ulcer ranging between 3-5 mm in size was found   Biopsy with negative H.pylori.   . ESOPHAGOGASTRODUODENOSCOPY  N/A 10/28/2014   Dr. Gala Romney: patulous EG junction, small hiatal hernia, previous noted gastric ulcer healed  . ESOPHAGOGASTRODUODENOSCOPY (EGD) WITH PROPOFOL N/A 11/26/2017   Procedure: ESOPHAGOGASTRODUODENOSCOPY (EGD) WITH PROPOFOL;  Surgeon: Daneil Dolin, MD;  [facility];  Service: Endoscopy;  Laterality: N/A;  . VAGINAL HYSTERECTOMY     bleeding     Family History  Problem Relation Age of Onset  . Uterine cancer Mother   . Heart disease Mother   . Cancer Mother   . Alcohol abuse Mother   . Arthritis Mother   . COPD Mother   . Depression Mother   . Heart disease Father        age 33, family questions autopsy  . Alcohol abuse Father   . Arthritis Father   . Depression Father   . Early death Father   . Cancer Other        ulcer, aunt  . Lung cancer Cousin   . Throat cancer Cousin   . Asthma Son   . Colon cancer Neg Hx   . Ulcers Neg Hx     Social History   Social History Narrative   Lives at home with [name]   Visits sister - stays at home   Social History   Tobacco Use  . Smoking status: Current Every Day Smoker    Packs/day: 0.25    Years: 40.00  Pack years: 10.00    Start date: 09/25/1973  . Smokeless tobacco: Never Used  . Tobacco comment: one pack - 4 days  Substance Use Topics  . Alcohol use: No    Current Meds  Medication Sig  . albuterol (PROVENTIL HFA;VENTOLIN HFA) 108 (90 Base) MCG/ACT inhaler Inhale 2 puffs into the lungs every 6 (six) hours as needed for wheezing or shortness of breath.  . Biotin 10000 MCG TABS Take 10,000 Units by mouth daily.  . cyclobenzaprine (FLEXERIL) 5 MG tablet Take 1 tablet (5 mg total) by mouth 3 (three) times daily as needed for muscle spasms.  Marland Kitchen desvenlafaxine (PRISTIQ) 50 MG 24 hr tablet TAKE 1 TABLET BY MOUTH EVERY DAY  . estradiol (ESTRACE) 2 MG tablet TAKE 1 TABLET BY MOUTH EVERY DAY  . levothyroxine (SYNTHROID) 25 MCG tablet Take 1 tablet (25 mcg total) by mouth daily before breakfast.  .  levothyroxine (SYNTHROID) 50 MCG tablet Take 1 tablet (50 mcg total) by mouth daily.  . Omega-3 Fatty Acids (FISH OIL) 1000 MG CAPS Take by mouth daily.  Marland Kitchen omeprazole (PRILOSEC) 40 MG capsule TAKE 1 CAPSULE BY MOUTH EVERY DAY  . TRULANCE 3 MG TABS Take 1 tablet by mouth daily.       Depression screen [facility] 2/9 02/24/2020 11/22/2017 08/14/2017 07/17/2017  Decreased Interest 0 0 0 0  Down, Depressed, Hopeless 0 0 0 0  PHQ - 2 Score 0 0 0 0     Objective:   Today's Vitals: BP 130/64   Pulse 64   Ht 5\' 1"  (1.549 m)   Wt 144 lb (65.3 kg)   BMI 27.21 kg/m  Vitals with BMI 03/08/2020 02/24/2020 02/16/2020  Height 5\' 1"  5\' 1"  5\' 1"   Weight 144 lbs 145 lbs 6 oz 144 lbs 3 oz  BMI 27.22 84.69 62.95  Systolic 284 132 90  Diastolic 64 79 70  Pulse 64 139 77     Physical Exam   She looks systemically well.  Weight is stable.  Blood pressure is excellent.  She is alert and orientated without any new focal neurological signs.    Assessment   1. Dizziness   2. Acquired hypothyroidism   3. Chest pain, unspecified type       Tests ordered No orders of the defined types were placed in this encounter.    Plan: 1. We will chase upon the CT scan of the head and dermatology referral. 2. I have told her to continue with the same dose of Synthroid/levothyroxine and we will check levels the next time I see her.  This new dose seems to be tolerated. 3. Today I stressed the importance of drinking adequate water and I told her she has to drink 70 ounces at least of water every day.  She will try to do this. 4. I will see her in about a month's time for follow-up to see how she is doing and we will check blood levels then.   No orders of the defined types were placed in this encounter.   Doree Albee, MD

## 2020-03-08 NOTE — Patient Instructions (Signed)
Your physician recommends that you schedule a follow-up appointment in: PENDING WITH DR BRANCH  Your physician recommends that you continue on your current medications as directed. Please refer to the Current Medication list given to you today.  Your physician has requested that you have a lexiscan myoview. For further information please visit [email or web address]. Please follow instruction sheet, as given.  Thank you for choosing [facility]!!    

## 2020-03-08 NOTE — Progress Notes (Signed)
Clinical Summary Ms. Roes is a 59 y.o.female seen today for follow up of the following medical probloems.   1. Chest pain CAD risk factors: HL, mother with "heart troubles" in 60s, father MI 59s. Maternal aunt with heart troubles. + tobacco x 40 years.  - seen back in 2015 for chest pain and SOB, benign echo and stress echo  - recent chest pains - symptoms started about 1 month ago - sharp pain mid chest, 7/10 in severity. Can occur at rest or with exertion. No other associated symptoms. Not positional Lasts just a few seconds - can have som pain with exertion, example with housework.       PMH 1. Hyperlipidemia 2. Hypothyroidism    Past Medical History:  Diagnosis Date  . Allergy   . Anemia    thalcemia  . Anxiety   . Arthritis   . Cancer [facility])    Cervical cancer  . COPD (chronic obstructive pulmonary disease) ([place])   . Depression   . Dyspnea   . GERD (gastroesophageal reflux disease)   . Hyperlipidemia   . Hypothyroidism      Allergies  Allergen Reactions  . Penicillins Hives    Has patient had a PCN reaction causing immediate rash, facial/tongue/throat swelling, SOB or lightheadedness with hypotension: No Has patient had a PCN reaction causing severe rash involving mucus membranes or skin necrosis: Yes Has patient had a PCN reaction that required hospitalization: No Has patient had a PCN reaction occurring within the last 10 years: No If all of the above answers are "NO", then may proceed with Cephalosporin use.   Marland Kitchen Morphine And Related Itching           Current Outpatient Medications  Medication Sig Dispense Refill  . albuterol (PROVENTIL HFA;VENTOLIN HFA) 108 (90 Base) MCG/ACT inhaler Inhale 2 puffs into the lungs every 6 (six) hours as needed for wheezing or shortness of breath. 1 Inhaler 1  . Biotin 10000 MCG TABS Take 10,000 Units by mouth daily.    . cyclobenzaprine (FLEXERIL) 5 MG tablet Take 1 tablet (5 mg total) by mouth 3 (three)  times daily as needed for muscle spasms. 30 tablet 1  . desvenlafaxine (PRISTIQ) 50 MG 24 hr tablet TAKE 1 TABLET BY MOUTH EVERY DAY 90 tablet 0  . estradiol (ESTRACE) 2 MG tablet TAKE 1 TABLET BY MOUTH EVERY DAY 90 tablet 1  . levothyroxine (SYNTHROID) 25 MCG tablet Take 1 tablet (25 mcg total) by mouth daily before breakfast. 30 tablet 0  . levothyroxine (SYNTHROID) 50 MCG tablet Take 1 tablet (50 mcg total) by mouth daily. 90 tablet 1  . Omega-3 Fatty Acids (FISH OIL) 1000 MG CAPS Take by mouth daily.    Marland Kitchen omeprazole (PRILOSEC) 40 MG capsule TAKE 1 CAPSULE BY MOUTH EVERY DAY 90 capsule 3  . TRULANCE 3 MG TABS Take 1 tablet by mouth daily.     No current facility-administered medications for this visit.     Past Surgical History:  Procedure Laterality Date  . carpel tunnel release     bilaterally  . CESAREAN SECTION    . COLONOSCOPY N/A 06/22/2014   Dr. Rourk:External and internal hemorrhoids-grade 4; otherwise normal rectum total colon and terminal ileum.  . COLONOSCOPY WITH PROPOFOL N/A 11/26/2017   Procedure: COLONOSCOPY WITH PROPOFOL;  Surgeon: Daneil Dolin, MD;  [facility];  Service: Endoscopy;  Laterality: N/A;  9:45am  . COLPOSCOPY    . ESOPHAGOGASTRODUODENOSCOPY N/A 06/22/2014  Dr. Gala Romney:The mucosa of the esophagus appeared normal  Single gastric ulcer ranging between 3-5 mm in size was found   Biopsy with negative H.pylori.   . ESOPHAGOGASTRODUODENOSCOPY N/A 10/28/2014   Dr. Gala Romney: patulous EG junction, small hiatal hernia, previous noted gastric ulcer healed  . ESOPHAGOGASTRODUODENOSCOPY (EGD) WITH PROPOFOL N/A 11/26/2017   Procedure: ESOPHAGOGASTRODUODENOSCOPY (EGD) WITH PROPOFOL;  Surgeon: Daneil Dolin, MD;  [facility];  Service: Endoscopy;  Laterality: N/A;  . VAGINAL HYSTERECTOMY     bleeding     Allergies  Allergen Reactions  . Penicillins Hives    Has patient had a PCN reaction causing immediate rash, facial/tongue/throat swelling, SOB  or lightheadedness with hypotension: No Has patient had a PCN reaction causing severe rash involving mucus membranes or skin necrosis: Yes Has patient had a PCN reaction that required hospitalization: No Has patient had a PCN reaction occurring within the last 10 years: No If all of the above answers are "NO", then may proceed with Cephalosporin use.   Marland Kitchen Morphine And Related Itching            Family History  Problem Relation Age of Onset  . Uterine cancer Mother   . Heart disease Mother   . Cancer Mother   . Alcohol abuse Mother   . Arthritis Mother   . COPD Mother   . Depression Mother   . Heart disease Father        age 33, family questions autopsy  . Alcohol abuse Father   . Arthritis Father   . Depression Father   . Early death Father   . Cancer Other        ulcer, aunt  . Lung cancer Cousin   . Throat cancer Cousin   . Asthma Son   . Colon cancer Neg Hx   . Ulcers Neg Hx      Social History Ms. Branan reports that she has been smoking. She started smoking about 46 years ago. She has a 10.00 pack-year smoking history. She has never used smokeless tobacco. Ms. Whitmore reports no history of alcohol use.   Review of Systems CONSTITUTIONAL: No weight loss, fever, chills, weakness or fatigue.  HEENT: Eyes: No visual loss, blurred vision, double vision or yellow sclerae.No hearing loss, sneezing, congestion, runny nose or sore throat.  SKIN: No rash or itching.  CARDIOVASCULAR: per hpi RESPIRATORY: per hpi GASTROINTESTINAL: No anorexia, nausea, vomiting or diarrhea. No abdominal pain or blood.  GENITOURINARY: No burning on urination, no polyuria NEUROLOGICAL: No headache, dizziness, syncope, paralysis, ataxia, numbness or tingling in the extremities. No change in bowel or bladder control.  MUSCULOSKELETAL: No muscle, back pain, joint pain or stiffness.  LYMPHATICS: No enlarged nodes. No history of splenectomy.  PSYCHIATRIC: No history of depression or anxiety.    ENDOCRINOLOGIC: No reports of sweating, cold or heat intolerance. No polyuria or polydipsia.  Marland Kitchen   Physical Examination Today's Vitals   03/08/20 1434  BP: 120/68  Pulse: 71  SpO2: 96%  Weight: 144 lb (65.3 kg)  Height: 5\' 1"  (1.549 m)   Body mass index is 27.21 kg/m.  Gen: resting comfortably, no acute distress HEENT: no scleral icterus, pupils equal round and reactive, no palptable cervical adenopathy,  CV: RRR, no m/rg, no jvd Resp: Clear to auscultation bilaterally GI: abdomen is soft, non-tender, non-distended, normal bowel sounds, no hepatosplenomegaly MSK: extremities are warm, no edema.  Skin: warm, no rash Neuro:  no focal deficits Psych: appropriate affect   Diagnostic  Studies  01/2014 stress echo Study Conclusions   - Stress ECG conclusions: Sinus tachycardia. No diagnostic  ST segment changes with exercise, equivocal ST segment  depression in late recovery leads II, III, aVF in the  absence of chest pain. The stress ECG was negative for  ischemia. [facility] scoring: exercise time of 8.40min; maximum  ST deviation of 0.53mm; no angina; resulting score is 6.  This score predicts a low risk of cardiac events.  - Staged echo: There was no diagnostic evidence for  stress-induced ischemia.   01/2014 echo Study Conclusions   - Left ventricle: The cavity size was normal. Wall thickness  was normal. Systolic function was normal. The estimated  ejection fraction was in the range of 60% to 65%. Wall  motion was normal; there were no regional wall motion  abnormalities. Doppler parameters are consistent with  abnormal left ventricular relaxation (grade 1 diastolic  dysfunction).  - Aortic valve: Mildly calcified annulus.  - Mitral valve: Mildly thickened leaflets . Mild  regurgitation.      Assessment and Plan  1. Chest pain - recent chest pain symptoms of unclear etiology - multiple CAD risk factors including strong family history -  plan for lexiscan to further evaluate.    F/u pending stress results, if negative f/u just as needed   Arnoldo Lenis, M.D

## 2020-03-09 ENCOUNTER — Ambulatory Visit (HOSPITAL_COMMUNITY)
Admission: RE | Admit: 2020-03-09 | Discharge: 2020-03-09 | Disposition: A | Discharge status: Home / Self Care | Payer: Medicaid Other | Source: Ambulatory Visit | Attending: Nurse Practitioner | Admitting: Nurse Practitioner

## 2020-03-09 ENCOUNTER — Encounter (HOSPITAL_COMMUNITY): Payer: Medicaid Other

## 2020-03-09 DIAGNOSIS — N644 Mastodynia: Secondary | ICD-10-CM

## 2020-03-09 DIAGNOSIS — R922 Inconclusive mammogram: Secondary | ICD-10-CM | POA: Diagnosis not present

## 2020-03-12 ENCOUNTER — Ambulatory Visit (HOSPITAL_COMMUNITY)
Admission: RE | Admit: 2020-03-12 | Discharge: 2020-03-12 | Disposition: A | Discharge status: Home / Self Care | Payer: Medicaid Other | Source: Ambulatory Visit | Attending: Internal Medicine | Admitting: Internal Medicine

## 2020-03-12 ENCOUNTER — Other Ambulatory Visit: Payer: Self-pay

## 2020-03-12 DIAGNOSIS — R251 Tremor, unspecified: Secondary | ICD-10-CM | POA: Diagnosis not present

## 2020-03-12 DIAGNOSIS — R42 Dizziness and giddiness: Secondary | ICD-10-CM | POA: Diagnosis not present

## 2020-03-15 NOTE — Progress Notes (Signed)
Please call this patient and let her know that the CT scan of her brain was completely normal.  Thanks.

## 2020-03-15 NOTE — Progress Notes (Signed)
Just called her and left her a voicemail message.

## 2020-03-18 ENCOUNTER — Other Ambulatory Visit: Payer: Self-pay

## 2020-03-18 ENCOUNTER — Ambulatory Visit (HOSPITAL_COMMUNITY)
Admission: RE | Admit: 2020-03-18 | Discharge: 2020-03-18 | Disposition: A | Discharge status: Home / Self Care | Payer: Medicaid Other | Source: Ambulatory Visit | Attending: Cardiology | Admitting: Cardiology

## 2020-03-18 ENCOUNTER — Encounter (HOSPITAL_COMMUNITY): Payer: Self-pay

## 2020-03-18 ENCOUNTER — Encounter (HOSPITAL_COMMUNITY)
Admission: RE | Admit: 2020-03-18 | Discharge: 2020-03-18 | Disposition: A | Discharge status: Home / Self Care | Payer: Medicaid Other | Source: Ambulatory Visit | Attending: Cardiology | Admitting: Cardiology

## 2020-03-18 DIAGNOSIS — R079 Chest pain, unspecified: Secondary | ICD-10-CM

## 2020-03-18 LAB — NM MYOCAR MULTI W/SPECT W/WALL MOTION / EF
LV dias vol: 62 mL (ref 46–106)
LV sys vol: 19 mL
Peak HR: 109 {beats}/min
RATE: 0.28
Rest HR: 65 {beats}/min
SDS: 3
SRS: 1
SSS: 4
TID: 0.99

## 2020-03-18 MED ORDER — SODIUM CHLORIDE FLUSH 0.9 % IV SOLN
INTRAVENOUS | Status: AC
  Administered 2020-03-18: 10 mL via INTRAVENOUS
  Filled 2020-03-18: qty 10

## 2020-03-18 MED ORDER — REGADENOSON 0.4 MG/5ML IV SOLN
INTRAVENOUS | Status: AC
  Filled 2020-03-18: qty 5

## 2020-03-18 MED ORDER — TECHNETIUM TC 99M TETROFOSMIN IV KIT
30.0000 | PACK | Freq: Once | INTRAVENOUS | Status: AC | PRN
  Administered 2020-03-18: 29.4 via INTRAVENOUS

## 2020-03-18 MED ORDER — TECHNETIUM TC 99M TETROFOSMIN IV KIT
10.0000 | PACK | Freq: Once | INTRAVENOUS | Status: AC | PRN
  Administered 2020-03-18: 11 via INTRAVENOUS

## 2020-03-31 ENCOUNTER — Telehealth: Payer: Self-pay | Admitting: *Deleted

## 2020-03-31 NOTE — Telephone Encounter (Signed)
-----   Message from Arnoldo Lenis, MD sent at 03/30/2020 10:25 AM EDT ----- Normal stress test, no signs of blockages. Needs to f/u with pcp to discuss noncardiac causes of her symptosm. Can f/u with [place] 4 months   Zandra Abts MD

## 2020-03-31 NOTE — Telephone Encounter (Signed)
LM to return call.

## 2020-04-01 NOTE — Telephone Encounter (Signed)
Pt voiced understanding - 4 month f/u scheduled - routed to pcp

## 2020-04-05 ENCOUNTER — Encounter (INDEPENDENT_AMBULATORY_CARE_PROVIDER_SITE_OTHER): Payer: Self-pay | Admitting: Internal Medicine

## 2020-04-05 ENCOUNTER — Ambulatory Visit (INDEPENDENT_AMBULATORY_CARE_PROVIDER_SITE_OTHER): Payer: Medicaid Other | Admitting: Internal Medicine

## 2020-04-05 ENCOUNTER — Other Ambulatory Visit: Payer: Self-pay

## 2020-04-05 VITALS — BP 105/70 | HR 71 | Temp 96.8°F | Ht 61.0 in | Wt 142.8 lb

## 2020-04-05 DIAGNOSIS — R42 Dizziness and giddiness: Secondary | ICD-10-CM

## 2020-04-05 DIAGNOSIS — R079 Chest pain, unspecified: Secondary | ICD-10-CM | POA: Diagnosis not present

## 2020-04-05 DIAGNOSIS — E039 Hypothyroidism, unspecified: Secondary | ICD-10-CM | POA: Diagnosis not present

## 2020-04-05 NOTE — Progress Notes (Signed)
Metrics: Intervention Frequency ACO  Documented Smoking Status Yearly  Screened one or more times in 24 months  Cessation Counseling or  Active cessation medication Past 24 months  Past 24 months   Guideline developer: UpToDate (See UpToDate for funding source) Date Released: 2014       Wellness Office Visit  Subjective:  Patient ID: Kathleen Erickson, female    DOB: 10-26-1960  Age: 59 y.o. MRN: [id number]  CC: This lady comes in for follow-up of hypothyroidism, dizziness, chest pain. HPI  She has been reviewed by cardiology and a nuclear medicine stress test was negative.  Her chest pain is resolved.  Also, as far as the dizziness is concerned, CT brain scan was negative.  She feels that her dizziness is actually improving although not completely resolved. She continues on levothyroxine for her hypothyroidism and she seems to be tolerating this fairly well.  She has no side effects. Past Medical History:  Diagnosis Date  . Allergy   . Anemia    thalcemia  . Anxiety   . Arthritis   . Cancer [facility])    Cervical cancer  . COPD (chronic obstructive pulmonary disease) ([place])   . Depression   . Dyspnea   . GERD (gastroesophageal reflux disease)   . Hyperlipidemia   . Hypothyroidism    Past Surgical History:  Procedure Laterality Date  . carpel tunnel release     bilaterally  . CESAREAN SECTION    . COLONOSCOPY N/A 06/22/2014   Dr. Rourk:External and internal hemorrhoids-grade 4; otherwise normal rectum total colon and terminal ileum.  . COLONOSCOPY WITH PROPOFOL N/A 11/26/2017   Procedure: COLONOSCOPY WITH PROPOFOL;  Surgeon: Daneil Dolin, MD;  [facility];  Service: Endoscopy;  Laterality: N/A;  9:45am  . COLPOSCOPY    . ESOPHAGOGASTRODUODENOSCOPY N/A 06/22/2014   Dr. Gala Romney:The mucosa of the esophagus appeared normal  Single gastric ulcer ranging between 3-5 mm in size was found   Biopsy with negative H.pylori.   . ESOPHAGOGASTRODUODENOSCOPY N/A 10/28/2014   Dr.  Gala Romney: patulous EG junction, small hiatal hernia, previous noted gastric ulcer healed  . ESOPHAGOGASTRODUODENOSCOPY (EGD) WITH PROPOFOL N/A 11/26/2017   Procedure: ESOPHAGOGASTRODUODENOSCOPY (EGD) WITH PROPOFOL;  Surgeon: Daneil Dolin, MD;  [facility];  Service: Endoscopy;  Laterality: N/A;  . VAGINAL HYSTERECTOMY     bleeding     Family History  Problem Relation Age of Onset  . Uterine cancer Mother   . Heart disease Mother   . Cancer Mother   . Alcohol abuse Mother   . Arthritis Mother   . COPD Mother   . Depression Mother   . Heart disease Father        age 10, family questions autopsy  . Alcohol abuse Father   . Arthritis Father   . Depression Father   . Early death Father   . Cancer Other        ulcer, aunt  . Lung cancer Cousin   . Throat cancer Cousin   . Asthma Son   . Colon cancer Neg Hx   . Ulcers Neg Hx     Social History   Social History Narrative   Lives at home with Kathleen Erickson   Visits sister - stays at home   Social History   Tobacco Use  . Smoking status: Current Every Day Smoker    Packs/day: 0.25    Years: 40.00    Pack years: 10.00    Start date: 09/25/1973  .  Smokeless tobacco: Never Used  . Tobacco comment: one cigg per day, trying to quit  Substance Use Topics  . Alcohol use: No    Current Meds  Medication Sig  . albuterol (PROVENTIL HFA;VENTOLIN HFA) 108 (90 Base) MCG/ACT inhaler Inhale 2 puffs into the lungs every 6 (six) hours as needed for wheezing or shortness of breath.  . Biotin 10000 MCG TABS Take 10,000 Units by mouth daily.  Kathleen Erickson desvenlafaxine (PRISTIQ) 50 MG 24 hr tablet TAKE 1 TABLET BY MOUTH EVERY DAY  . estradiol (ESTRACE) 2 MG tablet TAKE 1 TABLET BY MOUTH EVERY DAY  . levothyroxine (SYNTHROID) 50 MCG tablet Take 1 tablet (50 mcg total) by mouth daily.  . Omega-3 Fatty Acids (FISH OIL) 1000 MG CAPS Take by mouth daily.   Kathleen Erickson omeprazole (PRILOSEC) 40 MG capsule TAKE 1 CAPSULE BY MOUTH EVERY DAY  . TRULANCE 3 MG TABS  Take 1 tablet by mouth daily.      Depression screen [facility] 2/9 02/24/2020 11/22/2017 08/14/2017 07/17/2017  Decreased Interest 0 0 0 0  Down, Depressed, Hopeless 0 0 0 0  PHQ - 2 Score 0 0 0 0     Objective:   Today's Vitals: BP 105/70 (BP Location: Left Arm, Patient Position: Sitting, Cuff Size: Normal)   Pulse 71   Temp (!) 96.8 F (36 C) (Temporal)   Ht 5\' 1"  (1.549 m)   Wt 142 lb 12.8 oz (64.8 kg)   SpO2 98%   BMI 26.98 kg/m  Vitals with BMI 04/05/2020 03/08/2020 03/08/2020  Height 5\' 1"  5\' 1"  5\' 1"   Weight 142 lbs 13 oz 144 lbs 144 lbs  BMI 27 31.59 45.85  Systolic 929 244 628  Diastolic 70 68 64  Pulse 71 71 64     Physical Exam   She looks systemically well.  Blood pressure is stable.  Weight is stable.  No other new physical findings today.    Assessment   1. Acquired hypothyroidism   2. Dizziness   3. Chest pain, unspecified type       Tests ordered No orders of the defined types were placed in this encounter.    Plan: 1. For her hypothyroidism, she will continue with the same dose of levothyroxine.  We will check lab work next visit. 2.Her chest pain seems to have resolved so I will not investigate this further. 3.Her dizziness is still present but much improved and if it gets worse, we may need to refer her to a specialist. 4.  I will see her in 4 months time for follow-up.  No orders of the defined types were placed in this encounter.   Kathleen Albee, MD

## 2020-05-19 ENCOUNTER — Other Ambulatory Visit (INDEPENDENT_AMBULATORY_CARE_PROVIDER_SITE_OTHER): Payer: Self-pay | Admitting: Internal Medicine

## 2020-07-06 ENCOUNTER — Ambulatory Visit (INDEPENDENT_AMBULATORY_CARE_PROVIDER_SITE_OTHER): Payer: Medicaid Other | Admitting: Internal Medicine

## 2020-07-06 ENCOUNTER — Encounter (INDEPENDENT_AMBULATORY_CARE_PROVIDER_SITE_OTHER): Payer: Self-pay | Admitting: Internal Medicine

## 2020-07-06 ENCOUNTER — Other Ambulatory Visit: Payer: Self-pay

## 2020-07-06 VITALS — BP 128/76 | HR 52 | Temp 97.3°F | Ht 61.0 in | Wt 143.8 lb

## 2020-07-06 DIAGNOSIS — E039 Hypothyroidism, unspecified: Secondary | ICD-10-CM | POA: Diagnosis not present

## 2020-07-06 DIAGNOSIS — Z23 Encounter for immunization: Secondary | ICD-10-CM | POA: Diagnosis not present

## 2020-07-06 DIAGNOSIS — E559 Vitamin D deficiency, unspecified: Secondary | ICD-10-CM

## 2020-07-06 DIAGNOSIS — E2839 Other primary ovarian failure: Secondary | ICD-10-CM

## 2020-07-06 DIAGNOSIS — H9203 Otalgia, bilateral: Secondary | ICD-10-CM | POA: Diagnosis not present

## 2020-07-06 DIAGNOSIS — J449 Chronic obstructive pulmonary disease, unspecified: Secondary | ICD-10-CM | POA: Diagnosis not present

## 2020-07-06 MED ORDER — LEVOFLOXACIN 500 MG PO TABS
500.0000 mg | ORAL_TABLET | Freq: Every day | ORAL | 0 refills | Status: DC
Start: 2020-07-06 — End: 2020-08-10

## 2020-07-06 MED ORDER — TRULANCE 3 MG PO TABS: 1.0000 | ORAL_TABLET | Freq: Every day | ORAL | 1 refills | Status: DC

## 2020-07-06 MED ORDER — ALBUTEROL SULFATE HFA 108 (90 BASE) MCG/ACT IN AERS: 2.0000 | INHALATION_SPRAY | Freq: Four times a day (QID) | RESPIRATORY_TRACT | 3 refills | Status: DC | PRN

## 2020-07-06 NOTE — Progress Notes (Signed)
Metrics: Intervention Frequency ACO  Documented Smoking Status Yearly  Screened one or more times in 24 months  Cessation Counseling or  Active cessation medication Past 24 months  Past 24 months   Guideline developer: UpToDate (See UpToDate for funding source) Date Released: 2014       Wellness Office Visit  Subjective:  Patient ID: Kathleen Erickson, female    DOB: [birth year]  Age: 59 y.o. MRN: [id number]  CC: This lady comes in for follow-up of hypothyroidism, menopausal state and bioidentical hormone therapy, vitamin D deficiency and COPD. HPI  Today, she described bilateral ear discomfort and pain.  She has had the symptoms for 2 weeks now.  She denies any fever, hearing loss. She says she has been taking vitamin D3 10,000 units daily for vitamin D deficiency. She continues on albuterol inhaler as needed for COPD which is not very severe. She continues on levothyroxine.  Unfortunately, she could not tolerate desiccated NP thyroid. She does continue with estradiol in the morning but she apparently had some issue with progesterone relating to the quality of her stools which is an unusual side effect.  Therefore, I do not think she is taking progesterone.  She has had a hysterectomy in the past. Past Medical History:  Diagnosis Date  . Allergy   . Anemia    thalcemia  . Anxiety   . Arthritis   . Cancer [facility])    Cervical cancer  . COPD (chronic obstructive pulmonary disease) ([place])   . Depression   . Dyspnea   . GERD (gastroesophageal reflux disease)   . Hyperlipidemia   . Hypothyroidism    Past Surgical History:  Procedure Laterality Date  . carpel tunnel release     bilaterally  . CESAREAN SECTION    . COLONOSCOPY N/A 06/22/2014   Dr. Rourk:External and internal hemorrhoids-grade 4; otherwise normal rectum total colon and terminal ileum.  . COLONOSCOPY WITH PROPOFOL N/A 11/26/2017   Procedure: COLONOSCOPY WITH PROPOFOL;  Surgeon: Daneil Dolin, MD;  [facility];  Service: Endoscopy;  Laterality: N/A;  9:45am  . COLPOSCOPY    . ESOPHAGOGASTRODUODENOSCOPY N/A 06/22/2014   Dr. Gala Romney:The mucosa of the esophagus appeared normal  Single gastric ulcer ranging between 3-5 mm in size was found   Biopsy with negative H.pylori.   . ESOPHAGOGASTRODUODENOSCOPY N/A 10/28/2014   Dr. Gala Romney: patulous EG junction, small hiatal hernia, previous noted gastric ulcer healed  . ESOPHAGOGASTRODUODENOSCOPY (EGD) WITH PROPOFOL N/A 11/26/2017   Procedure: ESOPHAGOGASTRODUODENOSCOPY (EGD) WITH PROPOFOL;  Surgeon: Daneil Dolin, MD;  [facility];  Service: Endoscopy;  Laterality: N/A;  . VAGINAL HYSTERECTOMY     bleeding     Family History  Problem Relation Age of Onset  . Uterine cancer Mother   . Heart disease Mother   . Cancer Mother   . Alcohol abuse Mother   . Arthritis Mother   . COPD Mother   . Depression Mother   . Heart disease Father        age 61, family questions autopsy  . Alcohol abuse Father   . Arthritis Father   . Depression Father   . Early death Father   . Cancer Other        ulcer, aunt  . Lung cancer Cousin   . Throat cancer Cousin   . Asthma Son   . Colon cancer Neg Hx   . Ulcers Neg Hx     Social History   Social History Narrative  Lives at home with [name]   Visits sister - stays at home   Social History   Tobacco Use  . Smoking status: Current Every Day Smoker    Packs/day: 0.25    Years: 40.00    Pack years: 10.00    Start date: 09/25/1973  . Smokeless tobacco: Never Used  . Tobacco comment: one cigg per day, trying to quit  Substance Use Topics  . Alcohol use: No    Current Meds  Medication Sig  . albuterol (VENTOLIN HFA) 108 (90 Base) MCG/ACT inhaler Inhale 2 puffs into the lungs every 6 (six) hours as needed for wheezing or shortness of breath.  . Biotin 10000 MCG TABS Take 10,000 Units by mouth daily.  . Cholecalciferol (VITAMIN D-3) 125 MCG (5000 UT) TABS Take 2 tablets by mouth daily.  Marland Kitchen  desvenlafaxine (PRISTIQ) 50 MG 24 hr tablet TAKE 1 TABLET BY MOUTH EVERY DAY  . estradiol (ESTRACE) 2 MG tablet TAKE 1 TABLET BY MOUTH EVERY DAY  . levothyroxine (SYNTHROID) 50 MCG tablet Take 1 tablet (50 mcg total) by mouth daily.  . Omega-3 Fatty Acids (FISH OIL) 1000 MG CAPS Take by mouth daily.   Marland Kitchen omeprazole (PRILOSEC) 40 MG capsule TAKE 1 CAPSULE BY MOUTH EVERY DAY  . TRULANCE 3 MG TABS Take 1 tablet by mouth daily.  . vitamin B-12 (CYANOCOBALAMIN) 250 MCG tablet Take 250 mcg by mouth daily.  . [DISCONTINUED] albuterol (PROVENTIL HFA;VENTOLIN HFA) 108 (90 Base) MCG/ACT inhaler Inhale 2 puffs into the lungs every 6 (six) hours as needed for wheezing or shortness of breath.  . [DISCONTINUED] progesterone (PROMETRIUM) 200 MG capsule Take 200 mg by mouth at bedtime.  . [DISCONTINUED] TRULANCE 3 MG TABS Take 1 tablet by mouth daily.      Depression screen [facility] 2/9 02/24/2020 11/22/2017 08/14/2017 07/17/2017  Decreased Interest 0 0 0 0  Down, Depressed, Hopeless 0 0 0 0  PHQ - 2 Score 0 0 0 0     Objective:   Today's Vitals: BP 128/76   Pulse (!) 52   Temp (!) 97.3 F (36.3 C) (Temporal)   Ht 5\' 1"  (1.549 m)   Wt 143 lb 12.8 oz (65.2 kg)   SpO2 93%   BMI 27.17 kg/m  Vitals with BMI 07/06/2020 04/05/2020 03/08/2020  Height 5\' 1"  5\' 1"  5\' 1"   Weight 143 lbs 13 oz 142 lbs 13 oz 144 lbs  BMI 46.50 27 35.46  Systolic 568 127 517  Diastolic 76 70 68  Pulse 52 71 71     Physical Exam  Examination of both her ear canals show that there is no wax but both tympanic membranes look somewhat cloudy.  Her weight is stable.  Blood pressure is in a good range.  She is alert and orientated without any focal neurological signs.     Assessment   1. Acquired hypothyroidism   2. Primary ovarian failure   3. Chronic obstructive pulmonary disease, unspecified COPD type ([place])   4. Vitamin D deficiency disease   5. Ear pain, bilateral       Tests ordered Orders Placed This Encounter    Procedures  . T3, free  . T4  . TSH  . Progesterone  . Estradiol  . COMPLETE METABOLIC PANEL WITH GFR  . VITAMIN D 25 Hydroxy (Vit-D Deficiency, Fractures)     Plan: 1. She will continue with current dose of levothyroxine but if her T3 is not in a good range, I may add  Cytomel as she did not tolerate desiccated NP thyroid. 2. We will check estradiol and progesterone levels today. 3. She will continue with the same dose of vitamin D3 and I will check vitamin D levels today. 4. I am going to empirically treat her with Levaquin for possible otitis media but if she does not improve after the course is finished, I will refer to ENT. 5. She was given influenza and pneumococcal 23 vaccination today. 6. Follow-up in about 4 months.   Meds ordered this encounter  Medications  . TRULANCE 3 MG TABS    Sig: Take 1 tablet by mouth daily.    Dispense:  90 tablet    Refill:  1  . albuterol (VENTOLIN HFA) 108 (90 Base) MCG/ACT inhaler    Sig: Inhale 2 puffs into the lungs every 6 (six) hours as needed for wheezing or shortness of breath.    Dispense:  18 g    Refill:  3  . levofloxacin (LEVAQUIN) 500 MG tablet    Sig: Take 1 tablet (500 mg total) by mouth daily.    Dispense:  7 tablet    Refill:  0    Shonia Skilling Luther Parody, MD

## 2020-07-06 NOTE — Addendum Note (Signed)
Addended by: Anibal Henderson on: 07/06/2020 08:53 AM   Modules accepted: Orders

## 2020-07-07 LAB — COMPLETE METABOLIC PANEL WITH GFR
AG Ratio: 1.6 (calc) (ref 1.0–2.5)
ALT: 10 U/L (ref 6–29)
AST: 17 U/L (ref 10–35)
Albumin: 4.2 g/dL (ref 3.6–5.1)
Alkaline phosphatase (APISO): 55 U/L (ref 37–153)
BUN: 13 mg/dL (ref 7–25)
CO2: 28 mmol/L (ref 20–32)
Calcium: 9.3 mg/dL (ref 8.6–10.4)
Chloride: 103 mmol/L (ref 98–110)
Creat: 0.54 mg/dL (ref 0.50–0.99)
GFR, Est African American: 119 mL/min/{1.73_m2} (ref >=60)
GFR, Est Non African American: 103 mL/min/{1.73_m2} (ref >=60)
Globulin: 2.6 g/dL (calc) (ref 1.9–3.7)
Glucose, Bld: 85 mg/dL (ref 65–99)
Potassium: 4.3 mmol/L (ref 3.5–5.3)
Sodium: 139 mmol/L (ref 135–146)
Total Bilirubin: 0.4 mg/dL (ref 0.2–1.2)
Total Protein: 6.8 g/dL (ref 6.1–8.1)

## 2020-07-07 LAB — T4: T4, Total: 11.4 ug/dL (ref 5.1–11.9)

## 2020-07-07 LAB — VITAMIN D 25 HYDROXY (VIT D DEFICIENCY, FRACTURES): Vit D, 25-Hydroxy: 27 ng/mL — ABNORMAL LOW (ref 30–100)

## 2020-07-07 LAB — ESTRADIOL: Estradiol: 148 pg/mL

## 2020-07-07 LAB — PROGESTERONE: Progesterone: <0.5 ng/mL

## 2020-07-07 LAB — TSH: TSH: 8.06 mIU/L — ABNORMAL HIGH (ref 0.40–4.50)

## 2020-07-07 LAB — T3, FREE: T3, Free: 2.6 pg/mL (ref 2.3–4.2)

## 2020-07-08 ENCOUNTER — Other Ambulatory Visit (INDEPENDENT_AMBULATORY_CARE_PROVIDER_SITE_OTHER): Payer: Self-pay | Admitting: Internal Medicine

## 2020-07-08 MED ORDER — LIOTHYRONINE SODIUM 5 MCG PO TABS
5.0000 ug | ORAL_TABLET | Freq: Every day | ORAL | 3 refills | Status: DC
Start: 2020-07-08 — End: 2020-09-06

## 2020-07-08 NOTE — Progress Notes (Signed)
Please call this patient and let her know that her thyroid is still not well controlled.  Continue taking the levothyroxine but I am going to add another medication called Cytomel and she takes this once a day and I have sent this to her pharmacy.Her vitamin D levels are still very low.  Make sure she is taking vitamin D3 10,000 units daily.  If she is, she needs to increase the vitamin D3 to 15,000 units daily.Follow-up as scheduled.

## 2020-07-12 NOTE — Progress Notes (Signed)
Waiting for her to call back.

## 2020-07-13 NOTE — Progress Notes (Signed)
Pt called will send her a mychart note of what to pickup at pharmacy. I explained but want to make sure she is going to pick up correct medications. Pt brought fish oil & calcium . So she was NOT taking Vitaim D3 at all.So sending it in a note to her also what to do.  Thinking this is what she needed from [place] to go to pharmacy.  Mrs Nolton:  This is what you will need to pick up at pharmacy based on our phone conversation.  1 New prescription for Cytomel, take 1 daily with Levothyroxine &  other medications.  2. Vitamin D3 5,000IU's caplets. Take 2 capsules daily. Since you are not taking it at all. (10,000 units/day). Made a note to Dr Anastasio Champion about this also aware.  3. Send a mychart message if you need anything or help, or concerns. We will Answer soon as we receive in computer.Very quickly we try to respond on daily.

## 2020-08-06 ENCOUNTER — Ambulatory Visit: Payer: Medicaid Other | Admitting: Cardiology

## 2020-08-09 NOTE — Progress Notes (Signed)
Cardiology Office Note  Date: 82/42/3536   ID: Kathleen Erickson [birth year], MRN [id number]  PCP:  Doree Albee, MD  Cardiologist:  Carlyle Dolly, MD Electrophysiologist:  None   Chief Complaint: Follow-up other chest pain  History of Present Illness: Kathleen Erickson is a 59 y.o. female with a history of chest pain.  Other history includes hyperlipidemia, hypothyroidism, HTN, COPD, GERD.  Last encounter with Dr. Harl Bowie 03/08/2020.  Experiencing recent chest pains which started a month prior.  Described chest pain as sharp and mid chest, 7 out of 10 in severity.  Could occur at rest or with exertion.  No other associated symptoms, lasting only seconds.  Could have some chest pain with exertion such as when doing housework.  Stress test was ordered which and performed on 03/18/2020.  It was considered low risk.  She is here for 43-month follow-up today.  She denies any recent anginal or exertional symptoms.  States she has been having some palpitations a few times a week which are not bothersome and not associated with any other symptoms.  She states she is a fairly heavy coffee drinker.  Which may be contributing.  States her primary care provider has recently increased her thyroid medication and included in the mother related medication for her thyroid called Cytomel.  She denies any orthostatic symptoms, CVA or TIA-like symptoms, PND, orthopnea, bleeding, claudication, DVT or PE-like symptoms, lower extremity edema.  Past Medical History:  Diagnosis Date  . Allergy   . Anemia    thalcemia  . Anxiety   . Arthritis   . Cancer [facility])    Cervical cancer  . COPD (chronic obstructive pulmonary disease) ([place])   . Depression   . Dyspnea   . GERD (gastroesophageal reflux disease)   . Hyperlipidemia   . Hypothyroidism     Past Surgical History:  Procedure Laterality Date  . carpel tunnel release     bilaterally  . CESAREAN SECTION    . COLONOSCOPY N/A 06/22/2014   Dr.  Rourk:External and internal hemorrhoids-grade 4; otherwise normal rectum total colon and terminal ileum.  . COLONOSCOPY WITH PROPOFOL N/A 11/26/2017   Procedure: COLONOSCOPY WITH PROPOFOL;  Surgeon: Daneil Dolin, MD;  [facility];  Service: Endoscopy;  Laterality: N/A;  9:45am  . COLPOSCOPY    . ESOPHAGOGASTRODUODENOSCOPY N/A 06/22/2014   Dr. Gala Romney:The mucosa of the esophagus appeared normal  Single gastric ulcer ranging between 3-5 mm in size was found   Biopsy with negative H.pylori.   . ESOPHAGOGASTRODUODENOSCOPY N/A 10/28/2014   Dr. Gala Romney: patulous EG junction, small hiatal hernia, previous noted gastric ulcer healed  . ESOPHAGOGASTRODUODENOSCOPY (EGD) WITH PROPOFOL N/A 11/26/2017   Procedure: ESOPHAGOGASTRODUODENOSCOPY (EGD) WITH PROPOFOL;  Surgeon: Daneil Dolin, MD;  [facility];  Service: Endoscopy;  Laterality: N/A;  . VAGINAL HYSTERECTOMY     bleeding    Current Outpatient Medications  Medication Sig Dispense Refill  . albuterol (VENTOLIN HFA) 108 (90 Base) MCG/ACT inhaler Inhale 2 puffs into the lungs every 6 (six) hours as needed for wheezing or shortness of breath. 18 g 3  . Biotin 10000 MCG TABS Take 10,000 Units by mouth daily.    . Cholecalciferol (VITAMIN D-3) 125 MCG (5000 UT) TABS Take 2 tablets by mouth daily.    Marland Kitchen desvenlafaxine (PRISTIQ) 50 MG 24 hr tablet TAKE 1 TABLET BY MOUTH EVERY DAY 90 tablet 0  . estradiol (ESTRACE) 2 MG tablet TAKE 1 TABLET BY MOUTH  EVERY DAY 90 tablet 1  . levothyroxine (SYNTHROID) 50 MCG tablet Take 1 tablet (50 mcg total) by mouth daily. 90 tablet 1  . liothyronine (CYTOMEL) 5 MCG tablet Take 1 tablet (5 mcg total) by mouth daily. 30 tablet 3  . Omega-3 Fatty Acids (FISH OIL) 1000 MG CAPS Take by mouth daily.     Marland Kitchen omeprazole (PRILOSEC) 40 MG capsule TAKE 1 CAPSULE BY MOUTH EVERY DAY 90 capsule 3  . TRULANCE 3 MG TABS Take 1 tablet by mouth daily. 90 tablet 1  . vitamin B-12 (CYANOCOBALAMIN) 250 MCG tablet Take 250 mcg  by mouth daily.     No current facility-administered medications for this visit.   Allergies:  Penicillins and Morphine and related   Social History: The patient  reports that she has been smoking. She started smoking about 46 years ago. She has a 10.00 pack-year smoking history. She has never used smokeless tobacco. She reports that she does not drink alcohol and does not use drugs.   Family History: The patient's family history includes Alcohol abuse in her father and mother; Arthritis in her father and mother; Asthma in her son; COPD in her mother; Cancer in her mother and another family member; Depression in her father and mother; Early death in her father; Heart disease in her father and mother; Lung cancer in her cousin; Throat cancer in her cousin; Uterine cancer in her mother.   ROS:  Please see the history of present illness. Otherwise, complete review of systems is positive for none.  All other systems are reviewed and negative.   Physical Exam: VS:  BP 120/72   Pulse 64   Ht 5\' 1"  (1.549 m)   Wt 144 lb (65.3 kg)   SpO2 97%   BMI 27.21 kg/m , BMI Body mass index is 27.21 kg/m.  Wt Readings from Last 3 Encounters:  08/10/20 144 lb (65.3 kg)  07/06/20 143 lb 12.8 oz (65.2 kg)  04/05/20 142 lb 12.8 oz (64.8 kg)    General: Patient appears comfortable at rest. Neck: Supple, no elevated JVP or carotid bruits, no thyromegaly. Lungs: Clear to auscultation, nonlabored breathing at rest. Cardiac: Regular rate and rhythm, no S3 or significant systolic murmur, no pericardial rub. Extremities: No pitting edema, distal pulses 2+. Skin: Warm and dry. Musculoskeletal: No kyphosis. Neuropsychiatric: Alert and oriented x3, affect grossly appropriate.  ECG:  EKG on 05/19/2019 at PCP office showed sinus rhythm with first-degree AV block rate of 71.  Abnormal Q Q-wave V1, V2, poor R wave progression V3, anteroseptal myocardial infarction.  Recent Labwork: 02/11/2020: Hemoglobin 11.3;  Magnesium 2.2; Platelets 252 07/06/2020: ALT 10; AST 17; BUN 13; Creat 0.54; Potassium 4.3; Sodium 139; TSH 8.06     Component Value Date/Time   CHOL 199 07/17/2017 1539   TRIG 112 07/17/2017 1539   HDL 54 07/17/2017 1539   CHOLHDL 3.7 07/17/2017 1539   VLDL 14 11/17/2016 0750   LDLCALC 123 (H) 07/17/2017 1539    Other Studies Reviewed Today:  NST 03/18/2020 Study Result  Narrative & Impression   Horizontal ST segment depression ST segment depression of 1 mm was noted during stress in the II, III and aVF leads.  The study is normal. There are no perfusion defects  This is a low risk study.  The left ventricular ejection fraction is hyperdynamic (>65%).      01/2014 stress echo Study Conclusions  - Stress ECG conclusions: Sinus tachycardia. No diagnostic  ST segment changes with exercise,  equivocal ST segment  depression in late recovery leads II, III, aVF in the  absence of chest pain. The stress ECG was negative for  ischemia. [facility] scoring: exercise time of 8.87min; maximum  ST deviation of 0.44mm; no angina; resulting score is 6.  This score predicts a low risk of cardiac events.  - Staged echo: There was no diagnostic evidence for  stress-induced ischemia.   01/2014 echo Study Conclusions   - Left ventricle: The cavity size was normal. Wall thickness  was normal. Systolic function was normal. The estimated  ejection fraction was in the range of 60% to 65%. Wall  motion was normal; there were no regional wall motion  abnormalities. Doppler parameters are consistent with  abnormal left ventricular relaxation (grade 1 diastolic  dysfunction).  - Aortic valve: Mildly calcified annulus.  - Mitral valve: Mildly thickened leaflets . Mild  regurgitation.   Assessment and Plan:  1. Other chest pain   2. Essential hypertension   3. Mixed hyperlipidemia   4. Acquired hypothyroidism    1. Other chest pain Denies any recent anginal or  exertional symptoms.  Recent stress test was low risk.  2. Essential hypertension Blood pressure is well controlled today blood pressure 120/72.  Currently not on antihypertension medications.  3. Acquired hypothyroidism  Patient states she recently had her levothyroxine increased to 50 mcg daily.  Recent addition of liothyronine 5 mcg daily as well.  Recent TSH 1 month ago 8.06 improved from previous TSH 6 months ago of 18.6.  T3 and T4 within normal limits  Medication Adjustments/Labs and Tests Ordered: Current medicines are reviewed at length with the patient today.  Concerns regarding medicines are outlined above.   Disposition: Follow-up with Dr. Harl Bowie or APP 6 months  Signed, Levell July, NP 08/10/2020 10:56 AM    [facility] [place], [place],  [address] Phone: [phone]; Fax: [phone]

## 2020-08-10 ENCOUNTER — Ambulatory Visit (INDEPENDENT_AMBULATORY_CARE_PROVIDER_SITE_OTHER): Payer: Medicaid Other | Admitting: Family Medicine

## 2020-08-10 ENCOUNTER — Encounter: Payer: Self-pay | Admitting: Family Medicine

## 2020-08-10 VITALS — BP 120/72 | HR 64 | Ht 61.0 in | Wt 144.0 lb

## 2020-08-10 DIAGNOSIS — R0789 Other chest pain: Secondary | ICD-10-CM | POA: Diagnosis not present

## 2020-08-10 DIAGNOSIS — I10 Essential (primary) hypertension: Secondary | ICD-10-CM

## 2020-08-10 DIAGNOSIS — E039 Hypothyroidism, unspecified: Secondary | ICD-10-CM | POA: Diagnosis not present

## 2020-08-10 DIAGNOSIS — E782 Mixed hyperlipidemia: Secondary | ICD-10-CM

## 2020-08-10 NOTE — Patient Instructions (Signed)
Medication Instructions:  Continue all current medications.   Labwork: none  Testing/Procedures: none  Follow-Up: 6 months   Any Other Special Instructions Will Be Listed Below (If Applicable).   If you need a refill on your cardiac medications before your next appointment, please call your pharmacy.  

## 2020-08-11 ENCOUNTER — Ambulatory Visit: Payer: Medicaid Other | Admitting: Cardiology

## 2020-09-05 ENCOUNTER — Other Ambulatory Visit (INDEPENDENT_AMBULATORY_CARE_PROVIDER_SITE_OTHER): Payer: Self-pay | Admitting: Internal Medicine

## 2020-10-13 ENCOUNTER — Other Ambulatory Visit (INDEPENDENT_AMBULATORY_CARE_PROVIDER_SITE_OTHER): Payer: Self-pay | Admitting: Internal Medicine

## 2020-10-14 ENCOUNTER — Other Ambulatory Visit (INDEPENDENT_AMBULATORY_CARE_PROVIDER_SITE_OTHER): Payer: Self-pay | Admitting: Internal Medicine

## 2020-10-14 ENCOUNTER — Telehealth (INDEPENDENT_AMBULATORY_CARE_PROVIDER_SITE_OTHER): Payer: Self-pay | Admitting: Nurse Practitioner

## 2020-10-14 DIAGNOSIS — H9203 Otalgia, bilateral: Secondary | ICD-10-CM

## 2020-10-14 MED ORDER — LEVOFLOXACIN 500 MG PO TABS
500.0000 mg | ORAL_TABLET | Freq: Every day | ORAL | 0 refills | Status: DC
Start: 2020-10-14 — End: 2020-12-28

## 2020-10-14 NOTE — Telephone Encounter (Signed)
Called patient and let her know that Dr. Anastasio Champion is sending something in for her for the weekend to the [facility] pharmacy. Patient verbalized an understanding and thanked [place] for all we do.

## 2020-10-14 NOTE — Telephone Encounter (Signed)
Please call this patient and let her know that her concerns were relayed to Dr. Anastasio Champion.  He tells me that he had recalled recommending referral to ear nose and throat specialist if her symptoms keep persisting.  At this point, we will order the referral.  If she is experiencing significant pain or fever you can schedule her with me for an acute visit, but otherwise she should wait to hear from the ear nose and throat doctor for further evaluation and reccomedations.  Thank you.

## 2020-10-14 NOTE — Addendum Note (Signed)
Addended by: Ailene Ards on: 10/14/2020 11:31 AM   Modules accepted: Orders

## 2020-10-14 NOTE — Telephone Encounter (Signed)
As discussed, I have sent prescription for Levaquin to [facility].

## 2020-10-14 NOTE — Telephone Encounter (Signed)
I would recommend that she complete a visit, but I do not have any additional openings today. I am not sure if Dr. Anastasio Champion can try to do a virtual visit with her before the end of the day.

## 2020-10-14 NOTE — Telephone Encounter (Signed)
I asked her what she meant by ear drops, but she did not give me any specifics. I don't believe she knows exactly what she wants for ear drops, but maybe has a belief that ear drops of some type may be helpful. I did not get into what her current symptoms actually are when she was on the phone. Do you want her to have a visit? Or do you want me to offer ENT referral and go from there?

## 2020-10-14 NOTE — Telephone Encounter (Signed)
This patient called stating she would like eardrops prescribed for her.  She tells me that she is having an ongoing problem and has had this discussion with Dr. Anastasio Champion in the past and was told to call you if her symptoms persist.  She would like someone to call her back whether or not the eardrops will be prescribed or if she needs to be seen for an office visit.  Her phone number that she prefers is [phone] thank you.

## 2020-10-14 NOTE — Telephone Encounter (Signed)
I would appreciate it if you would refer to ENT as this is what we discussed.  Thanks.

## 2020-10-14 NOTE — Telephone Encounter (Signed)
Called patient and she stated that she is having ear itching and on a scale of 1-10 (10 being the worst) she is at a 6-7, ear odor 4-5, sore throat when she swallows at times, hoarse voice and improves with cold drink, yellow tint to nasal drainage at times, ear pain and right ear is worse than left ear.   Patient stated that the drops do help her and she really wants some drops before the weekend sent to [facility].  Please advise.

## 2020-10-14 NOTE — Telephone Encounter (Signed)
The last time I saw this lady, she appeared to have a otitis media and I told her that should she get recurrent problems, we will refer to ENT.  I am not sure what she means exactly by eardrops as I did not prescribe these on the last visit.  I can certainly refer her to ENT if this is what she wishes.

## 2020-11-09 ENCOUNTER — Ambulatory Visit (INDEPENDENT_AMBULATORY_CARE_PROVIDER_SITE_OTHER): Payer: Medicaid Other | Admitting: Internal Medicine

## 2020-11-17 ENCOUNTER — Ambulatory Visit (INDEPENDENT_AMBULATORY_CARE_PROVIDER_SITE_OTHER): Payer: Medicaid Other | Admitting: Internal Medicine

## 2020-11-17 ENCOUNTER — Other Ambulatory Visit: Payer: Self-pay

## 2020-11-17 ENCOUNTER — Encounter (INDEPENDENT_AMBULATORY_CARE_PROVIDER_SITE_OTHER): Payer: Self-pay | Admitting: Internal Medicine

## 2020-11-17 VITALS — BP 122/76 | HR 77 | Temp 97.2°F | Ht 61.0 in | Wt 146.0 lb

## 2020-11-17 DIAGNOSIS — R10811 Right upper quadrant abdominal tenderness: Secondary | ICD-10-CM

## 2020-11-17 DIAGNOSIS — R1900 Intra-abdominal and pelvic swelling, mass and lump, unspecified site: Secondary | ICD-10-CM | POA: Diagnosis not present

## 2020-11-17 DIAGNOSIS — E039 Hypothyroidism, unspecified: Secondary | ICD-10-CM

## 2020-11-17 DIAGNOSIS — H9203 Otalgia, bilateral: Secondary | ICD-10-CM | POA: Diagnosis not present

## 2020-11-17 DIAGNOSIS — E559 Vitamin D deficiency, unspecified: Secondary | ICD-10-CM | POA: Diagnosis not present

## 2020-11-17 MED ORDER — LEVOFLOXACIN 500 MG PO TABS: 500.0000 mg | ORAL_TABLET | Freq: Every day | ORAL | 0 refills | Status: DC

## 2020-11-17 NOTE — Progress Notes (Signed)
Metrics: Intervention Frequency ACO  Documented Smoking Status Yearly  Screened one or more times in 24 months  Cessation Counseling or  Active cessation medication Past 24 months  Past 24 months   Guideline developer: UpToDate (See UpToDate for funding source) Date Released: 2014       Wellness Office Visit  Subjective:  Patient ID: Kathleen Erickson, female    DOB: 06-12-1961  Age: 60 y.o. MRN: [id number]  CC: This lady comes in with bilateral ear pain and also to follow-up on her chronic conditions of hypothyroidism, vitamin D deficiency. HPI  She has again got bilateral ear pain and discharge.  I had referred her to Dr. Benjamine Mola, ENT but the office called her on the day of the appointment and she was unable to make that appointment. She continues on a combination of levothyroxine and Cytomel and she is tolerating this combination well. She continues on estradiol as before.  She was unable to tolerate progesterone at all in the past. Today she is also describing abdominal swelling for the last several months.  It feels very uncomfortable.  She denies any nausea or vomiting.  There is no significant abdominal pain. Past Medical History:  Diagnosis Date  . Allergy   . Anemia    thalcemia  . Anxiety   . Arthritis   . Cancer [facility])    Cervical cancer  . COPD (chronic obstructive pulmonary disease) ([place])   . Depression   . Dyspnea   . GERD (gastroesophageal reflux disease)   . Hyperlipidemia   . Hypothyroidism    Past Surgical History:  Procedure Laterality Date  . carpel tunnel release     bilaterally  . CESAREAN SECTION    . COLONOSCOPY N/A 06/22/2014   Dr. Rourk:External and internal hemorrhoids-grade 4; otherwise normal rectum total colon and terminal ileum.  . COLONOSCOPY WITH PROPOFOL N/A 11/26/2017   Procedure: COLONOSCOPY WITH PROPOFOL;  Surgeon: Daneil Dolin, MD;  [facility];  Service: Endoscopy;  Laterality: N/A;  9:45am  . COLPOSCOPY    .  ESOPHAGOGASTRODUODENOSCOPY N/A 06/22/2014   Dr. Gala Romney:The mucosa of the esophagus appeared normal  Single gastric ulcer ranging between 3-5 mm in size was found   Biopsy with negative H.pylori.   . ESOPHAGOGASTRODUODENOSCOPY N/A 10/28/2014   Dr. Gala Romney: patulous EG junction, small hiatal hernia, previous noted gastric ulcer healed  . ESOPHAGOGASTRODUODENOSCOPY (EGD) WITH PROPOFOL N/A 11/26/2017   Procedure: ESOPHAGOGASTRODUODENOSCOPY (EGD) WITH PROPOFOL;  Surgeon: Daneil Dolin, MD;  [facility];  Service: Endoscopy;  Laterality: N/A;  . VAGINAL HYSTERECTOMY     bleeding     Family History  Problem Relation Age of Onset  . Uterine cancer Mother   . Heart disease Mother   . Cancer Mother   . Alcohol abuse Mother   . Arthritis Mother   . COPD Mother   . Depression Mother   . Heart disease Father        age 66, family questions autopsy  . Alcohol abuse Father   . Arthritis Father   . Depression Father   . Early death Father   . Cancer Other        ulcer, aunt  . Lung cancer Cousin   . Throat cancer Cousin   . Asthma Son   . Colon cancer Neg Hx   . Ulcers Neg Hx     Social History   Social History Narrative   Lives at home with [name]   Visits sister -  stays at home   Social History   Tobacco Use  . Smoking status: Former Smoker    Packs/day: 0.25    Years: 40.00    Pack years: 10.00    Start date: 09/25/1973    Quit date: 05/17/2020    Years since quitting: 0.5  . Smokeless tobacco: Never Used  . Tobacco comment: one cigg in the last week   Substance Use Topics  . Alcohol use: No    Current Meds  Medication Sig  . albuterol (VENTOLIN HFA) 108 (90 Base) MCG/ACT inhaler Inhale 2 puffs into the lungs every 6 (six) hours as needed for wheezing or shortness of breath.  . Biotin 10000 MCG TABS Take 10,000 Units by mouth daily.  . Cholecalciferol (VITAMIN D-3) 125 MCG (5000 UT) TABS Take 2 tablets by mouth daily.  Marland Kitchen desvenlafaxine (PRISTIQ) 50 MG 24 hr tablet  TAKE 1 TABLET BY MOUTH EVERY DAY  . estradiol (ESTRACE) 2 MG tablet TAKE 1 TABLET BY MOUTH EVERY DAY  . levofloxacin (LEVAQUIN) 500 MG tablet Take 1 tablet (500 mg total) by mouth daily.  Marland Kitchen levofloxacin (LEVAQUIN) 500 MG tablet Take 1 tablet (500 mg total) by mouth daily.  Marland Kitchen levothyroxine (SYNTHROID) 50 MCG tablet TAKE 1 TABLET BY MOUTH EVERY DAY  . liothyronine (CYTOMEL) 5 MCG tablet TAKE 1 TABLET BY MOUTH EVERY DAY  . Omega-3 Fatty Acids (FISH OIL) 1000 MG CAPS Take by mouth daily.   Marland Kitchen omeprazole (PRILOSEC) 40 MG capsule TAKE 1 CAPSULE BY MOUTH EVERY DAY  . TRULANCE 3 MG TABS Take 1 tablet by mouth daily.  . vitamin B-12 (CYANOCOBALAMIN) 250 MCG tablet Take 250 mcg by mouth daily.     [place] Office Visit from 11/17/2020 in [facility]  PHQ-9 Total Score 0      Objective:   Today's Vitals: BP 122/76   Pulse 77   Temp (!) 97.2 F (36.2 C) (Temporal)   Ht 5\' 1"  (1.549 m)   Wt 146 lb (66.2 kg)   SpO2 97%   BMI 27.59 kg/m  Vitals with BMI 11/17/2020 08/10/2020 07/06/2020  Height 5\' 1"  5\' 1"  5\' 1"   Weight 146 lbs 144 lbs 143 lbs 13 oz  BMI 27.6 30.86 57.84  Systolic 696 295 284  Diastolic 76 72 76  Pulse 77 64 52     Physical Exam  She does not appear to be in acute pain.  Blood pressure is excellent.  Abdomen is soft but does appear to be distended and I am wondering if she has ascites.  She also appears to have right upper quadrant abdominal discomfort/tenderness with the possibility of a liver edge.  Examination of both ear canals show inflammation which is fairly widespread.     Assessment   1. Ear pain, bilateral   2. Acquired hypothyroidism   3. Abdominal swelling   4. Right upper quadrant abdominal tenderness without rebound tenderness   5. Vitamin D deficiency disease       Tests ordered Orders Placed This Encounter  Procedures  . US Abdomen Complete  . CBC  . COMPLETE METABOLIC PANEL WITH GFR  . T3, free  . T4, free  . TSH  .  VITAMIN D 25 Hydroxy (Vit-D Deficiency, Fractures)     Plan: 1. As far as her ear pain is concerned, I am going to try her on another course of antibiotics with Levaquin which she has tolerated in the past but I have urged her to call Dr.  Benjamine Mola, ENT and reschedule her appointment. 2. Continue with levothyroxine and Cytomel and we will check thyroid function test today. 3. Continue with vitamin D3 10,000 units daily and I will check vitamin D levels today. 4. I will arrange for her to have an ultrasound of the abdomen regarding her abdominal symptoms. 5. Further recommendations will depend on all these results and I will see her for follow-up in 3 months.   Meds ordered this encounter  Medications  . levofloxacin (LEVAQUIN) 500 MG tablet    Sig: Take 1 tablet (500 mg total) by mouth daily.    Dispense:  7 tablet    Refill:  0    Nole Robey Luther Parody, MD

## 2020-11-18 ENCOUNTER — Other Ambulatory Visit (INDEPENDENT_AMBULATORY_CARE_PROVIDER_SITE_OTHER): Payer: Self-pay | Admitting: Internal Medicine

## 2020-11-18 LAB — COMPLETE METABOLIC PANEL WITH GFR
AG Ratio: 1.5 (calc) (ref 1.0–2.5)
ALT: 11 U/L (ref 6–29)
AST: 16 U/L (ref 10–35)
Albumin: 4 g/dL (ref 3.6–5.1)
Alkaline phosphatase (APISO): 60 U/L (ref 37–153)
BUN: 13 mg/dL (ref 7–25)
CO2: 31 mmol/L (ref 20–32)
Calcium: 9 mg/dL (ref 8.6–10.4)
Chloride: 102 mmol/L (ref 98–110)
Creat: 0.81 mg/dL (ref 0.50–0.99)
GFR, Est African American: 91 mL/min/{1.73_m2} (ref >=60)
GFR, Est Non African American: 78 mL/min/{1.73_m2} (ref >=60)
Globulin: 2.6 g/dL (calc) (ref 1.9–3.7)
Glucose, Bld: 88 mg/dL (ref 65–139)
Potassium: 4 mmol/L (ref 3.5–5.3)
Sodium: 139 mmol/L (ref 135–146)
Total Bilirubin: 0.4 mg/dL (ref 0.2–1.2)
Total Protein: 6.6 g/dL (ref 6.1–8.1)

## 2020-11-18 LAB — CBC
HCT: 33.8 % — ABNORMAL LOW (ref 35.0–45.0)
Hemoglobin: 10.3 g/dL — ABNORMAL LOW (ref 11.7–15.5)
MCH: 19 pg — ABNORMAL LOW (ref 27.0–33.0)
MCHC: 30.5 g/dL — ABNORMAL LOW (ref 32.0–36.0)
MCV: 62.4 fL — ABNORMAL LOW (ref 80.0–100.0)
MPV: 10.6 fL (ref 7.5–12.5)
Platelets: 280 10*3/uL (ref 140–400)
RBC: 5.42 10*6/uL — ABNORMAL HIGH (ref 3.80–5.10)
RDW: 16.5 % — ABNORMAL HIGH (ref 11.0–15.0)
WBC: 7.4 10*3/uL (ref 3.8–10.8)

## 2020-11-18 LAB — VITAMIN D 25 HYDROXY (VIT D DEFICIENCY, FRACTURES): Vit D, 25-Hydroxy: 57 ng/mL (ref 30–100)

## 2020-11-18 LAB — T3, FREE: T3, Free: 2.6 pg/mL (ref 2.3–4.2)

## 2020-11-18 LAB — T4, FREE: Free T4: 1 ng/dL (ref 0.8–1.8)

## 2020-11-18 LAB — TSH: TSH: 3.09 mIU/L (ref 0.40–4.50)

## 2020-11-18 MED ORDER — LIOTHYRONINE SODIUM 5 MCG PO TABS: 10.0000 ug | ORAL_TABLET | Freq: Every day | ORAL | 1 refills | Status: DC

## 2020-11-22 ENCOUNTER — Ambulatory Visit (INDEPENDENT_AMBULATORY_CARE_PROVIDER_SITE_OTHER): Payer: Medicaid Other | Admitting: Internal Medicine

## 2020-11-24 NOTE — Progress Notes (Signed)
Please call patient and read what I have written on her labs.Thanks.

## 2020-11-29 ENCOUNTER — Telehealth (INDEPENDENT_AMBULATORY_CARE_PROVIDER_SITE_OTHER): Payer: Self-pay

## 2020-11-29 ENCOUNTER — Other Ambulatory Visit (INDEPENDENT_AMBULATORY_CARE_PROVIDER_SITE_OTHER): Payer: Self-pay | Admitting: Internal Medicine

## 2020-11-29 ENCOUNTER — Ambulatory Visit (HOSPITAL_COMMUNITY)
Admission: RE | Admit: 2020-11-29 | Discharge: 2020-11-29 | Disposition: A | Discharge status: Home / Self Care | Payer: Medicaid Other | Source: Ambulatory Visit | Attending: Internal Medicine | Admitting: Internal Medicine

## 2020-11-29 ENCOUNTER — Other Ambulatory Visit: Payer: Self-pay

## 2020-11-29 DIAGNOSIS — R10811 Right upper quadrant abdominal tenderness: Secondary | ICD-10-CM | POA: Insufficient documentation

## 2020-11-29 DIAGNOSIS — R1900 Intra-abdominal and pelvic swelling, mass and lump, unspecified site: Secondary | ICD-10-CM | POA: Insufficient documentation

## 2020-11-29 DIAGNOSIS — R1901 Right upper quadrant abdominal swelling, mass and lump: Secondary | ICD-10-CM | POA: Diagnosis not present

## 2020-11-29 MED ORDER — FLUCONAZOLE 150 MG PO TABS
150.0000 mg | ORAL_TABLET | Freq: Once | ORAL | 0 refills | Status: AC
Start: 2020-11-29 — End: 2020-11-29

## 2020-11-29 NOTE — Telephone Encounter (Signed)
Called patient and let her know that her Diflucan has been sent to the pharmacy. Patient verbalized an understanding and thanked [place].

## 2020-11-29 NOTE — Telephone Encounter (Signed)
Patient called and left a detailed voice message and stated that she is having itching and burning in her vaginal area since she started her antibiotic and she is asking for Diflucan to be sent to her pharmacy because it is getting worse since taking her antibiotic.  Please advise and I will call her back to let her know. Thanks!

## 2020-11-29 NOTE — Telephone Encounter (Signed)
I have sent a prescription [facility] [place].  She just needs to take 1 tablet.

## 2020-11-30 ENCOUNTER — Other Ambulatory Visit: Payer: Self-pay | Admitting: Nurse Practitioner

## 2020-11-30 ENCOUNTER — Other Ambulatory Visit (INDEPENDENT_AMBULATORY_CARE_PROVIDER_SITE_OTHER): Payer: Self-pay | Admitting: Internal Medicine

## 2020-12-01 ENCOUNTER — Encounter: Payer: Self-pay | Admitting: Internal Medicine

## 2020-12-01 NOTE — Telephone Encounter (Signed)
I have refilled but needs office visit before any additional refills.

## 2020-12-08 DIAGNOSIS — H608X3 Other otitis externa, bilateral: Secondary | ICD-10-CM | POA: Diagnosis not present

## 2020-12-08 DIAGNOSIS — H903 Sensorineural hearing loss, bilateral: Secondary | ICD-10-CM | POA: Diagnosis not present

## 2020-12-08 DIAGNOSIS — H9313 Tinnitus, bilateral: Secondary | ICD-10-CM | POA: Diagnosis not present

## 2020-12-24 ENCOUNTER — Other Ambulatory Visit (HOSPITAL_COMMUNITY): Payer: Self-pay | Admitting: Internal Medicine

## 2020-12-24 DIAGNOSIS — Z1231 Encounter for screening mammogram for malignant neoplasm of breast: Secondary | ICD-10-CM

## 2020-12-27 NOTE — Progress Notes (Signed)
Referring Provider: Doree Albee, MD Primary Care Physician:  Doree Albee, MD Primary GI: Dr. Gala Romney   Chief Complaint  Patient presents with  . Gastroesophageal Reflux    Does ok as long as she doesn't eat wrong food.  . Constipation    Likes Trulance. Will occ have runny bm    HPI:   Kathleen Erickson is a 60 y.o. female presenting today with a history of chronic GERD and constipation, with last EGD in 2019 normal. Colonoscopy in 2019 as well overall unrevealing. She was noted to have IDA and B12 deficiency  in 2019, at which time she underwent TCS/EGD. Recent Hgb 10.3. Chronic microcytic anemia. Celiac serologies negative historically. No recent iron studies.     GERD: taking omeprazole once to twice per day as needed, which controls her symptoms. No dysphagia.   Constipation: Trulance daily. Sometimes runny BM. Likes taking this. No overt GI bleeding.   IDA: feels tired. Craves ice water. Has restless legs, leg cramps. Takes NSAIDs rarely, only if needed.   Past Medical History:  Diagnosis Date  . Allergy   . Anemia    thalcemia  . Anxiety   . Arthritis   . Cancer [facility])    Cervical cancer  . COPD (chronic obstructive pulmonary disease) ([place])   . Depression   . Dyspnea   . GERD (gastroesophageal reflux disease)   . Hyperlipidemia   . Hypothyroidism     Past Surgical History:  Procedure Laterality Date  . carpel tunnel release     bilaterally  . CESAREAN SECTION    . COLONOSCOPY N/A 06/22/2014   Dr. Rourk:External and internal hemorrhoids-grade 4; otherwise normal rectum total colon and terminal ileum.  . COLONOSCOPY WITH PROPOFOL N/A 11/26/2017   external and internal hemorrhoids, prominent anal papilla, normal rectum and colon, normal TI.   Marland Kitchen COLPOSCOPY    . ESOPHAGOGASTRODUODENOSCOPY N/A 06/22/2014   Dr. Gala Romney:The mucosa of the esophagus appeared normal  Single gastric ulcer ranging between 3-5 mm in size was found   Biopsy with negative H.pylori.    . ESOPHAGOGASTRODUODENOSCOPY N/A 10/28/2014   Dr. Gala Romney: patulous EG junction, small hiatal hernia, previous noted gastric ulcer healed  . ESOPHAGOGASTRODUODENOSCOPY (EGD) WITH PROPOFOL N/A 11/26/2017   normal esophagus, normal stomach, normal duodenum.   Marland Kitchen VAGINAL HYSTERECTOMY     bleeding    Current Outpatient Medications  Medication Sig Dispense Refill  . Biotin 10000 MCG TABS Take 10,000 Units by mouth daily.    . Cholecalciferol (VITAMIN D-3) 125 MCG (5000 UT) TABS Take 2 tablets by mouth daily.    Marland Kitchen desvenlafaxine (PRISTIQ) 50 MG 24 hr tablet TAKE 1 TABLET BY MOUTH EVERY DAY 90 tablet 0  . estradiol (ESTRACE) 2 MG tablet TAKE 1 TABLET BY MOUTH EVERY DAY 90 tablet 1  . levothyroxine (SYNTHROID) 50 MCG tablet TAKE 1 TABLET BY MOUTH EVERY DAY 90 tablet 1  . liothyronine (CYTOMEL) 5 MCG tablet Take 2 tablets (10 mcg total) by mouth daily. 180 tablet 1  . Omega-3 Fatty Acids (FISH OIL) 1000 MG CAPS Take by mouth daily.     Marland Kitchen omeprazole (PRILOSEC) 40 MG capsule TAKE 1 CAPSULE BY MOUTH EVERY DAY (Patient taking differently: in the morning and at bedtime.) 90 capsule 0  . PROAIR HFA 108 (90 Base) MCG/ACT inhaler TAKE 2 PUFFS BY MOUTH EVERY 6 HOURS AS NEEDED FOR WHEEZE OR SHORTNESS OF BREATH 8.5 each 3  . TRULANCE 3 MG TABS Take  1 tablet by mouth daily. 90 tablet 1  . vitamin B-12 (CYANOCOBALAMIN) 250 MCG tablet Take 250 mcg by mouth daily.     No current facility-administered medications for this visit.    Allergies as of 12/28/2020 - Review Complete 12/28/2020  Allergen Reaction Noted  . Penicillins Hives 01/20/2014  . Morphine and related Itching 01/20/2014    Family History  Problem Relation Age of Onset  . Uterine cancer Mother   . Heart disease Mother   . Cancer Mother   . Alcohol abuse Mother   . Arthritis Mother   . COPD Mother   . Depression Mother   . Heart disease Father        age 83, family questions autopsy  . Alcohol abuse Father   . Arthritis Father   .  Depression Father   . Early death Father   . Cancer Other        ulcer, aunt  . Lung cancer Cousin   . Throat cancer Cousin   . Asthma Son   . Colon cancer Neg Hx   . Ulcers Neg Hx     Social History   Socioeconomic History  . Marital status: Married    Spouse name: [name]  . Number of children: 2  . Years of education: 10  . Highest education level: Not on file  Occupational History  . Occupation: not working    Comment: cleaned houses    Comment: disabled from arthritis  Tobacco Use  . Smoking status: Former Smoker    Packs/day: 0.25    Years: 40.00    Pack years: 10.00    Start date: 09/25/1973    Quit date: 05/17/2020    Years since quitting: 0.6  . Smokeless tobacco: Never Used  Vaping Use  . Vaping Use: Never used  Substance and Sexual Activity  . Alcohol use: No  . Drug use: No  . Sexual activity: Not Currently  Other Topics Concern  . Not on file  Social History Narrative   Lives at home with [name]   Visits sister - stays at home   Social Determinants of Health   Financial Resource Strain: Not on file  Food Insecurity: Not on file  Transportation Needs: Not on file  Physical Activity: Not on file  Stress: Not on file  Social Connections: Not on file    Review of Systems: Gen: Denies fever, chills, anorexia. Denies fatigue, weakness, weight loss.  CV: Denies chest pain, palpitations, syncope, peripheral edema, and claudication. Resp: Denies dyspnea at rest, cough, wheezing, coughing up blood, and pleurisy. GI: see HPI Derm: Denies rash, itching, dry skin Psych: Denies depression, anxiety, memory loss, confusion. No homicidal or suicidal ideation.  Heme: Denies bruising, bleeding, and enlarged lymph nodes.  Physical Exam: BP 116/67   Pulse 71   Temp (!) 96.8 F (36 C) (Temporal)   Ht 5\' 1"  (1.549 m)   Wt 145 lb 6.4 oz (66 kg)   BMI 27.47 kg/m  General:   Alert and oriented. No distress noted. Pleasant and cooperative.  Head:   Normocephalic and atraumatic. Eyes:  Conjuctiva clear without scleral icterus. Mouth:  Mask in place Abdomen:  +BS, soft, mild TTP epigastric and non-distended. No rebound or guarding. No HSM or masses noted. Msk:  Symmetrical without gross deformities. Normal posture. Extremities:  Without edema. Neurologic:  Alert and  oriented x4 Psych:  Alert and cooperative. Normal mood and affect.  ASSESSMENT: Kathleen Erickson is a 60 y.o. female  presenting today with a history of chronic GERD and constipation, with last EGD in 2019 normal. Colonoscopy in 2019 as well overall unrevealing. She was noted to have IDA and B12 deficiency  in 2019, at which time she underwent TCS/EGD. Returns for follow Recent Hgb 10.3. Chronic microcytic anemia. Celiac serologies negative historically. No recent iron studies.   GERD is controlled well with omeprazole once to twice per day. Will continue this. Constipation is managed well with Trulance daily.  Review of chart shows chronic microcytic anemia, and no recent iron studies. Known history of IDA. We will update this today. She does endorse NSAIDs although this is limited use and tries to abstain as much as possible. Will likely need capsule study to wrap up evaluation. I have ordered iron studies today.     PLAN:  Iron studies now Continue omeprazole once to BID Continue Trulance Anticipate capsule study Return in 6 months regardless   Annitta Needs, PhD, [facility]

## 2020-12-28 ENCOUNTER — Ambulatory Visit: Payer: Medicaid Other | Admitting: Gastroenterology

## 2020-12-28 ENCOUNTER — Encounter: Payer: Self-pay | Admitting: Internal Medicine

## 2020-12-28 ENCOUNTER — Other Ambulatory Visit: Payer: Self-pay

## 2020-12-28 ENCOUNTER — Encounter: Payer: Self-pay | Admitting: Gastroenterology

## 2020-12-28 VITALS — BP 116/67 | HR 71 | Temp 96.8°F | Ht 61.0 in | Wt 145.4 lb

## 2020-12-28 DIAGNOSIS — D509 Iron deficiency anemia, unspecified: Secondary | ICD-10-CM | POA: Diagnosis not present

## 2020-12-28 DIAGNOSIS — K5900 Constipation, unspecified: Secondary | ICD-10-CM

## 2020-12-28 DIAGNOSIS — K219 Gastro-esophageal reflux disease without esophagitis: Secondary | ICD-10-CM

## 2020-12-28 NOTE — Patient Instructions (Signed)
Please have blood work done today.  We will likely be setting you up for a capsule study, which takes pictures of your small intestine.   Continue omeprazole once to twice a day as needed, and continue Trulance.  We will see you in 6 months or sooner if needed!  Further recommendations after blood work completed!  I enjoyed seeing you again today! As you know, I value our relationship and want to provide genuine, compassionate, and quality care. I welcome your feedback. If you receive a survey regarding your visit,  I greatly appreciate you taking time to fill this out. See you next time!  Annitta Needs, PhD, ANP-BC [facility]

## 2020-12-28 NOTE — Progress Notes (Signed)
CC'ED TO PCP 

## 2020-12-29 LAB — IRON,TIBC AND FERRITIN PANEL
%SAT: 14 % (calc) — ABNORMAL LOW (ref 16–45)
Ferritin: 3 ng/mL — ABNORMAL LOW (ref 16–288)
Iron: 67 ug/dL (ref 45–160)
TIBC: 468 mcg/dL (calc) — ABNORMAL HIGH (ref 250–450)

## 2021-01-05 ENCOUNTER — Other Ambulatory Visit: Payer: Self-pay

## 2021-01-05 DIAGNOSIS — E611 Iron deficiency: Secondary | ICD-10-CM

## 2021-01-06 ENCOUNTER — Other Ambulatory Visit: Payer: Self-pay

## 2021-01-14 ENCOUNTER — Other Ambulatory Visit: Payer: Self-pay

## 2021-01-14 ENCOUNTER — Ambulatory Visit (HOSPITAL_COMMUNITY)
Admission: RE | Admit: 2021-01-14 | Discharge: 2021-01-14 | Disposition: A | Discharge status: Home / Self Care | Payer: Medicaid Other | Source: Ambulatory Visit | Attending: Internal Medicine | Admitting: Internal Medicine

## 2021-01-14 DIAGNOSIS — Z1231 Encounter for screening mammogram for malignant neoplasm of breast: Secondary | ICD-10-CM | POA: Insufficient documentation

## 2021-01-21 ENCOUNTER — Encounter (HOSPITAL_COMMUNITY): Payer: Self-pay | Admitting: Hematology and Oncology

## 2021-01-21 ENCOUNTER — Other Ambulatory Visit: Payer: Self-pay

## 2021-01-21 ENCOUNTER — Inpatient Hospital Stay (HOSPITAL_COMMUNITY): Payer: Medicaid Other

## 2021-01-21 ENCOUNTER — Inpatient Hospital Stay (HOSPITAL_COMMUNITY): Payer: Medicaid Other | Attending: Hematology and Oncology | Admitting: Hematology and Oncology

## 2021-01-21 VITALS — BP 126/51 | HR 62 | Temp 97.5°F | Resp 16 | Ht 61.0 in | Wt 145.6 lb

## 2021-01-21 VITALS — BP 116/44 | HR 59 | Temp 97.0°F | Resp 18

## 2021-01-21 DIAGNOSIS — J449 Chronic obstructive pulmonary disease, unspecified: Secondary | ICD-10-CM

## 2021-01-21 DIAGNOSIS — R400 Somnolence: Secondary | ICD-10-CM

## 2021-01-21 DIAGNOSIS — K219 Gastro-esophageal reflux disease without esophagitis: Secondary | ICD-10-CM | POA: Diagnosis not present

## 2021-01-21 DIAGNOSIS — Z836 Family history of other diseases of the respiratory system: Secondary | ICD-10-CM

## 2021-01-21 DIAGNOSIS — L603 Nail dystrophy: Secondary | ICD-10-CM

## 2021-01-21 DIAGNOSIS — Z8719 Personal history of other diseases of the digestive system: Secondary | ICD-10-CM | POA: Diagnosis not present

## 2021-01-21 DIAGNOSIS — Z79899 Other long term (current) drug therapy: Secondary | ICD-10-CM

## 2021-01-21 DIAGNOSIS — Z885 Allergy status to narcotic agent status: Secondary | ICD-10-CM | POA: Diagnosis not present

## 2021-01-21 DIAGNOSIS — Z808 Family history of malignant neoplasm of other organs or systems: Secondary | ICD-10-CM | POA: Diagnosis not present

## 2021-01-21 DIAGNOSIS — E039 Hypothyroidism, unspecified: Secondary | ICD-10-CM

## 2021-01-21 DIAGNOSIS — Z880 Allergy status to penicillin: Secondary | ICD-10-CM

## 2021-01-21 DIAGNOSIS — Z832 Family history of diseases of the blood and blood-forming organs and certain disorders involving the immune mechanism: Secondary | ICD-10-CM | POA: Diagnosis not present

## 2021-01-21 DIAGNOSIS — Z8041 Family history of malignant neoplasm of ovary: Secondary | ICD-10-CM | POA: Diagnosis not present

## 2021-01-21 DIAGNOSIS — Z811 Family history of alcohol abuse and dependence: Secondary | ICD-10-CM

## 2021-01-21 DIAGNOSIS — Z801 Family history of malignant neoplasm of trachea, bronchus and lung: Secondary | ICD-10-CM | POA: Diagnosis not present

## 2021-01-21 DIAGNOSIS — Z8261 Family history of arthritis: Secondary | ICD-10-CM | POA: Diagnosis not present

## 2021-01-21 DIAGNOSIS — Z87891 Personal history of nicotine dependence: Secondary | ICD-10-CM

## 2021-01-21 DIAGNOSIS — D500 Iron deficiency anemia secondary to blood loss (chronic): Secondary | ICD-10-CM

## 2021-01-21 DIAGNOSIS — E785 Hyperlipidemia, unspecified: Secondary | ICD-10-CM | POA: Diagnosis not present

## 2021-01-21 DIAGNOSIS — K649 Unspecified hemorrhoids: Secondary | ICD-10-CM

## 2021-01-21 DIAGNOSIS — Z8249 Family history of ischemic heart disease and other diseases of the circulatory system: Secondary | ICD-10-CM

## 2021-01-21 DIAGNOSIS — D509 Iron deficiency anemia, unspecified: Secondary | ICD-10-CM

## 2021-01-21 DIAGNOSIS — R5383 Other fatigue: Secondary | ICD-10-CM

## 2021-01-21 MED ORDER — LORATADINE 10 MG PO TABS
10.0000 mg | ORAL_TABLET | Freq: Every day | ORAL | Status: DC
  Administered 2021-01-21: 10 mg via ORAL

## 2021-01-21 MED ORDER — ACETAMINOPHEN 325 MG PO TABS
ORAL_TABLET | ORAL | Status: AC
  Filled 2021-01-21: qty 2

## 2021-01-21 MED ORDER — LORATADINE 10 MG PO TABS
ORAL_TABLET | ORAL | Status: AC
  Filled 2021-01-21: qty 1

## 2021-01-21 MED ORDER — SODIUM CHLORIDE 0.9 % IV SOLN
200.0000 mg | Freq: Once | INTRAVENOUS | Status: AC
  Administered 2021-01-21: 200 mg via INTRAVENOUS
  Filled 2021-01-21: qty 10

## 2021-01-21 MED ORDER — ACETAMINOPHEN 325 MG PO TABS
650.0000 mg | ORAL_TABLET | Freq: Once | ORAL | Status: AC
  Administered 2021-01-21: 650 mg via ORAL

## 2021-01-21 MED ORDER — SODIUM CHLORIDE 0.9 % IV SOLN: Freq: Once | INTRAVENOUS | Status: AC

## 2021-01-21 NOTE — Progress Notes (Signed)
Venofer 200mg given today per MD orders. Tolerated infusion without adverse affects. Vital signs stable. No complaints at this time. Discharged from clinic ambulatory in stable condition. Alert and oriented x 3. F/U with [facility] as scheduled.   

## 2021-01-21 NOTE — Progress Notes (Signed)
[place] NOTE  Patient Care Team: Doree Albee, MD as PCP - General (Internal Medicine) Harl Bowie Alphonse Guild, MD as PCP - Cardiology (Cardiology) Gala Romney Cristopher Estimable, MD as Consulting Physician (Gastroenterology)  CHIEF COMPLAINTS/PURPOSE OF CONSULTATION:  IDA  ASSESSMENT & PLAN:  No problem-specific Assessment & Plan notes found for this encounter.  Orders Placed This Encounter  Procedures  . Ambulatory referral to Genetics    Referral Priority:   Routine    Referral Type:   Consultation    Referral Reason:   Specialty Services Required    Number of Visits Requested:   1   This is a very pleasant 60 year old female patient with a past medical history significant for hypothyroidism, iron deficiency referred to hematology for evaluation and recommendations regarding severe iron deficiency noted on the most recent labs.  Kathleen Erickson arrived to the appointment today by herself.   She complains of fatigue, drowsiness, feeling winded on exertion, brittle nails and also mentions that thalassemia runs in the family. Physical examination unremarkable, no palpable lymphadenopathy or hepatosplenomegaly. I have reviewed her labs which showed severe iron deficiency and hence we discussed about IV iron since she feels really constipated with oral iron and she cannot tolerate it.  I discussed about adverse effects of IV wire iron including small risk of allergic reactions but overall it is very well-tolerated.  After checking with our authorization team, we have placed orders for Venofer which will be her preferred formulary.  Patient wanted to proceed with the first IV infusion today hence we have arranged for it.  She will return to clinic to complete the rest of the Venofer infusion.  She is already up-to-date with her colonoscopy and endoscopy which were unremarkable.  I do believe her iron deficiency is partly from blood loss secondary to hemorrhoids.  She has no family history  of colon cancer.  She should stay up-to-date with her age-appropriate cancer screening, no concerning review of systems for colon cancer today. With regards to her family history, she mentions that her mom had ovarian cancer hence have recommended referral to genetics.  Patient agrees and this referral has been placed. She should return to clinic in 3 months with repeat labs.  She is otherwise up-to-date with mammogram.  HISTORY OF PRESENTING ILLNESS:   Kathleen Erickson 60 y.o. female is here because of IDA.  Kathleen Erickson is a very pleasant 60 yr old female patient with PMH significant for dyslipidemia and COPD referred to hematology for recommendations regarding IDA. Patient has had history of IDA in the past but didn't require iron infusion She is feeling very tired, needs naps, winded when she does something, likes cold drink and has noted brittle nails. She denies any change in breathing, bowel habits and urinary habits No new neurological complaints. She had endoscopy and colonoscopy which showed hemorrhoids, no other major source of bleeding No family history of colon cancer.  Rest of the pertinent 10 point ROS reviewed and negative.  REVIEW OF SYSTEMS:   Constitutional: Denies fevers, chills or abnormal night sweats Eyes: Denies blurriness of vision, double vision or watery eyes Ears, nose, mouth, throat, and face: Denies mucositis or sore throat Respiratory: Denies cough, dyspnea or wheezes Cardiovascular: Denies palpitation, chest discomfort or lower extremity swelling Gastrointestinal:  Denies nausea, heartburn or change in bowel habits Skin: Denies abnormal skin rashes Lymphatics: Denies new lymphadenopathy or easy bruising Neurological:Denies numbness, tingling or new weaknesses Behavioral/Psych: Mood is stable, no new changes  All other systems were reviewed with the patient and are negative.  MEDICAL HISTORY:  Past Medical History:  Diagnosis Date  . Allergy   . Anemia     thalcemia  . Anxiety   . Arthritis   . Cancer [facility])    Cervical cancer  . COPD (chronic obstructive pulmonary disease) ([place])   . Depression   . Dyspnea   . GERD (gastroesophageal reflux disease)   . Hyperlipidemia   . Hypothyroidism     SURGICAL HISTORY: Past Surgical History:  Procedure Laterality Date  . carpel tunnel release     bilaterally  . CESAREAN SECTION    . COLONOSCOPY N/A 06/22/2014   Dr. Rourk:External and internal hemorrhoids-grade 4; otherwise normal rectum total colon and terminal ileum.  . COLONOSCOPY WITH PROPOFOL N/A 11/26/2017   external and internal hemorrhoids, prominent anal papilla, normal rectum and colon, normal TI.   Marland Kitchen COLPOSCOPY    . ESOPHAGOGASTRODUODENOSCOPY N/A 06/22/2014   Dr. Gala Romney:The mucosa of the esophagus appeared normal  Single gastric ulcer ranging between 3-5 mm in size was found   Biopsy with negative H.pylori.   . ESOPHAGOGASTRODUODENOSCOPY N/A 10/28/2014   Dr. Gala Romney: patulous EG junction, small hiatal hernia, previous noted gastric ulcer healed  . ESOPHAGOGASTRODUODENOSCOPY (EGD) WITH PROPOFOL N/A 11/26/2017   normal esophagus, normal stomach, normal duodenum.   Marland Kitchen VAGINAL HYSTERECTOMY     bleeding    SOCIAL HISTORY: Social History   Socioeconomic History  . Marital status: Legally Separated    Spouse name: [name]  . Number of children: 2  . Years of education: 10  . Highest education level: Not on file  Occupational History  . Occupation: not working    Comment: cleaned houses    Comment: disabled from arthritis  Tobacco Use  . Smoking status: Former Smoker    Packs/day: 0.25    Years: 40.00    Pack years: 10.00    Start date: 09/25/1973    Quit date: 05/17/2020    Years since quitting: 0.6  . Smokeless tobacco: Never Used  Vaping Use  . Vaping Use: Never used  Substance and Sexual Activity  . Alcohol use: No  . Drug use: No  . Sexual activity: Not Currently  Other Topics Concern  . Not on file  Social History Narrative    Visits sister - stays at home   Social Determinants of Health   Financial Resource Strain: Not on file  Food Insecurity: Not on file  Transportation Needs: Not on file  Physical Activity: Not on file  Stress: Not on file  Social Connections: Not on file  Intimate Partner Violence: Not on file    FAMILY HISTORY: Family History  Problem Relation Age of Onset  . Uterine cancer Mother   . Heart disease Mother   . Cancer Mother   . Alcohol abuse Mother   . Arthritis Mother   . COPD Mother   . Depression Mother   . Heart disease Father        age 32, family questions autopsy  . Alcohol abuse Father   . Arthritis Father   . Depression Father   . Early death Father   . Cancer Other        ulcer, aunt  . Lung cancer Cousin   . Throat cancer Cousin   . Asthma Son   . COPD Sister   . Cancer Maternal Aunt        ulcer  . Colon cancer Neg Hx   .  Ulcers Neg Hx     ALLERGIES:  is allergic to penicillins and morphine and related.  MEDICATIONS:  Current Outpatient Medications  Medication Sig Dispense Refill  . Biotin 10000 MCG TABS Take 10,000 Units by mouth daily.    . Cholecalciferol (VITAMIN D-3) 125 MCG (5000 UT) TABS Take 2 tablets by mouth daily.    Marland Kitchen desvenlafaxine (PRISTIQ) 50 MG 24 hr tablet TAKE 1 TABLET BY MOUTH EVERY DAY 90 tablet 0  . levothyroxine (SYNTHROID) 50 MCG tablet TAKE 1 TABLET BY MOUTH EVERY DAY 90 tablet 1  . liothyronine (CYTOMEL) 5 MCG tablet Take 2 tablets (10 mcg total) by mouth daily. 180 tablet 1  . Omega-3 Fatty Acids (FISH OIL) 1000 MG CAPS Take by mouth daily.     Marland Kitchen omeprazole (PRILOSEC) 40 MG capsule TAKE 1 CAPSULE BY MOUTH EVERY DAY (Patient taking differently: in the morning and at bedtime.) 90 capsule 0  . PROAIR HFA 108 (90 Base) MCG/ACT inhaler TAKE 2 PUFFS BY MOUTH EVERY 6 HOURS AS NEEDED FOR WHEEZE OR SHORTNESS OF BREATH 8.5 each 3  . TRULANCE 3 MG TABS Take 1 tablet by mouth daily. 90 tablet 1  . vitamin B-12 (CYANOCOBALAMIN) 250 MCG  tablet Take 250 mcg by mouth daily.    Marland Kitchen estradiol (ESTRACE) 2 MG tablet TAKE 1 TABLET BY MOUTH EVERY DAY 90 tablet 1   No current facility-administered medications for this visit.   Facility-Administered Medications Ordered in Other Visits  Medication Dose Route Frequency Provider Last Rate Last Admin  . iron sucrose (VENOFER) 200 mg in sodium chloride 0.9 % 100 mL IVPB  200 mg Intravenous Once Quatavious Rossa, Arletha Pili, MD      . loratadine (CLARITIN) tablet 10 mg  10 mg Oral Daily Dustine Stickler, Arletha Pili, MD   10 mg at 01/21/21 1058     PHYSICAL EXAMINATION:  ECOG PERFORMANCE STATUS: 1 - Symptomatic but completely ambulatory  Vitals:   01/21/21 1008  BP: (!) 126/51  Pulse: 62  Resp: 16  Temp: (!) 97.5 F (36.4 C)  SpO2: 100%   Filed Weights   01/21/21 1008  Weight: 145 lb 9.6 oz (66 kg)    GENERAL:alert, no distress and comfortable SKIN: skin color, texture, turgor are normal, no rashes or significant lesions EYES: normal, conjunctiva are pink and non-injected, sclera clear OROPHARYNX:no exudate, no erythema and lips, buccal mucosa, and tongue normal  NECK: supple, thyroid normal size, non-tender, without nodularity LYMPH:  no palpable lymphadenopathy in the cervical, axillary or inguinal LUNGS: clear to auscultation and percussion with normal breathing effort HEART: regular rate & rhythm and no murmurs and no lower extremity edema ABDOMEN:abdomen soft, non-tender and normal bowel sounds Musculoskeletal:no cyanosis of digits and no clubbing  PSYCH: alert & oriented x 3 with fluent speech NEURO: no focal motor/sensory deficits  LABORATORY DATA:  I have reviewed the data as listed Lab Results  Component Value Date   WBC 7.4 11/17/2020   HGB 10.3 (L) 11/17/2020   HCT 33.8 (L) 11/17/2020   MCV 62.4 (L) 11/17/2020   PLT 280 11/17/2020     Chemistry      Component Value Date/Time   NA 139 11/17/2020 1547   K 4.0 11/17/2020 1547   CL 102 11/17/2020 1547   CO2 31 11/17/2020 1547    BUN 13 11/17/2020 1547   CREATININE 0.81 11/17/2020 1547      Component Value Date/Time   CALCIUM 9.0 11/17/2020 1547   ALKPHOS 64 02/11/2020 1013   AST 16  11/17/2020 1547   ALT 11 11/17/2020 1547   BILITOT 0.4 11/17/2020 1547       RADIOGRAPHIC STUDIES: I have personally reviewed the radiological images as listed and agreed with the findings in the report. MM 3D SCREEN BREAST BILATERAL  Result Date: 01/14/2021 CLINICAL DATA:  Screening. EXAM: DIGITAL SCREENING BILATERAL MAMMOGRAM WITH TOMOSYNTHESIS AND CAD TECHNIQUE: Bilateral screening digital craniocaudal and mediolateral oblique mammograms were obtained. Bilateral screening digital breast tomosynthesis was performed. The images were evaluated with computer-aided detection. COMPARISON:  Previous exam(s). ACR Breast Density Category c: The breast tissue is heterogeneously dense, which may obscure small masses. FINDINGS: There are no findings suspicious for malignancy. The images were evaluated with computer-aided detection. IMPRESSION: No mammographic evidence of malignancy. A result letter of this screening mammogram will be mailed directly to the patient. RECOMMENDATION: Screening mammogram in one year. (Code:SM-B-01Y) BI-RADS CATEGORY  1: Negative. Electronically Signed   By: Claudie Revering M.D.   On: 01/14/2021 16:27    All questions were answered. The patient knows to call the clinic with any problems, questions or concerns. I spent 35 minutes in the care of this patient including H and P, review of records, counseling and coordination of care.     Benay Pike, MD 01/21/2021 11:14 AM

## 2021-01-21 NOTE — Patient Instructions (Signed)
[facility]  Discharge Instructions: Thank you for choosing [facility] to provide your oncology and hematology care.  If you have a lab appointment with the [facility], please come in thru the Main Entrance and check in at the main information desk.  We strive to give you quality time with your provider. You may need to reschedule your appointment if you arrive late (15 or more minutes).  Arriving late affects you and other patients whose appointments are after yours.  Also, if you miss three or more appointments without notifying the office, you may be dismissed from the clinic at the provider's discretion.      For prescription refill requests, have your pharmacy contact our office and allow 72 hours for refills to be completed.    Today you received Venofer infusion.     BELOW ARE SYMPTOMS THAT SHOULD BE REPORTED IMMEDIATELY: . *FEVER GREATER THAN 100.4 F (38 C) OR HIGHER . *CHILLS OR SWEATING . *NAUSEA AND VOMITING THAT IS NOT CONTROLLED WITH YOUR NAUSEA MEDICATION . *UNUSUAL SHORTNESS OF BREATH . *UNUSUAL BRUISING OR BLEEDING . *URINARY PROBLEMS (pain or burning when urinating, or frequent urination) . *BOWEL PROBLEMS (unusual diarrhea, constipation, pain near the anus) . TENDERNESS IN MOUTH AND THROAT WITH OR WITHOUT PRESENCE OF ULCERS (sore throat, sores in mouth, or a toothache) . UNUSUAL RASH, SWELLING OR PAIN  . UNUSUAL VAGINAL DISCHARGE OR ITCHING   Items with * indicate a potential emergency and should be followed up as soon as possible or go to the Emergency Department if any problems should occur.  Please show the CHEMOTHERAPY ALERT CARD or IMMUNOTHERAPY ALERT CARD at check-in to the Emergency Department and triage nurse.  Should you have questions after your visit or need to cancel or reschedule your appointment, please contact [facility] [phone]  and follow the prompts.  Office hours are 8:00 a.m. to 4:30 p.m. Monday -  Friday. Please note that voicemails left after 4:00 p.m. may not be returned until the following business day.  We are closed weekends and major holidays. You have access to a nurse at all times for urgent questions. Please call the main number to the clinic [phone] and follow the prompts.  For any non-urgent questions, you may also contact your provider using MyChart. We now offer e-Visits for anyone 64 and older to request care online for non-urgent symptoms. For details visit [email or web address].   Also download the MyChart app! Go to the app store, search "MyChart", open the app, select [facility], and log in with your MyChart username and password.  Due to Covid, a mask is required upon entering the hospital/clinic. If you do not have a mask, one will be given to you upon arrival. For doctor visits, patients may have 1 support person aged 60 or older with them. For treatment visits, patients cannot have anyone with them due to current Covid guidelines and our immunocompromised population.

## 2021-01-24 ENCOUNTER — Other Ambulatory Visit: Payer: Self-pay

## 2021-01-24 ENCOUNTER — Encounter (HOSPITAL_COMMUNITY): Payer: Self-pay

## 2021-01-24 ENCOUNTER — Inpatient Hospital Stay (HOSPITAL_COMMUNITY): Payer: Medicaid Other | Attending: Hematology and Oncology

## 2021-01-24 VITALS — BP 115/48 | HR 64 | Temp 96.9°F | Resp 18

## 2021-01-24 DIAGNOSIS — K649 Unspecified hemorrhoids: Secondary | ICD-10-CM | POA: Insufficient documentation

## 2021-01-24 DIAGNOSIS — Z79899 Other long term (current) drug therapy: Secondary | ICD-10-CM | POA: Insufficient documentation

## 2021-01-24 DIAGNOSIS — D500 Iron deficiency anemia secondary to blood loss (chronic): Secondary | ICD-10-CM | POA: Diagnosis present

## 2021-01-24 MED ORDER — ACETAMINOPHEN 325 MG PO TABS
650.0000 mg | ORAL_TABLET | Freq: Once | ORAL | Status: AC
  Administered 2021-01-24: 650 mg via ORAL
  Filled 2021-01-24: qty 2

## 2021-01-24 MED ORDER — SODIUM CHLORIDE 0.9 % IV SOLN
200.0000 mg | Freq: Once | INTRAVENOUS | Status: AC
  Administered 2021-01-24: 200 mg via INTRAVENOUS
  Filled 2021-01-24: qty 200

## 2021-01-24 MED ORDER — LORATADINE 10 MG PO TABS
10.0000 mg | ORAL_TABLET | Freq: Once | ORAL | Status: AC
  Administered 2021-01-24: 10 mg via ORAL
  Filled 2021-01-24: qty 1

## 2021-01-24 MED ORDER — SODIUM CHLORIDE 0.9 % IV SOLN: Freq: Once | INTRAVENOUS | Status: AC

## 2021-01-24 NOTE — Patient Instructions (Signed)
[facility]  Discharge Instructions: Thank you for choosing [facility] to provide your oncology and hematology care.  If you have a lab appointment with the Cancer Center, please come in thru the Main Entrance and check in at the main information desk.  Wear comfortable clothing and clothing appropriate for easy access to any Portacath or PICC line.   We strive to give you quality time with your provider. You may need to reschedule your appointment if you arrive late (15 or more minutes).  Arriving late affects you and other patients whose appointments are after yours.  Also, if you miss three or more appointments without notifying the office, you may be dismissed from the clinic at the provider's discretion.      For prescription refill requests, have your pharmacy contact our office and allow 72 hours for refills to be completed.    Today you received your iron infusion, return as scheduled.     To help prevent nausea and vomiting after your treatment, we encourage you to take your nausea medication as directed.  BELOW ARE SYMPTOMS THAT SHOULD BE REPORTED IMMEDIATELY: . *FEVER GREATER THAN 100.4 F (38 C) OR HIGHER . *CHILLS OR SWEATING . *NAUSEA AND VOMITING THAT IS NOT CONTROLLED WITH YOUR NAUSEA MEDICATION . *UNUSUAL SHORTNESS OF BREATH . *UNUSUAL BRUISING OR BLEEDING . *URINARY PROBLEMS (pain or burning when urinating, or frequent urination) . *BOWEL PROBLEMS (unusual diarrhea, constipation, pain near the anus) . TENDERNESS IN MOUTH AND THROAT WITH OR WITHOUT PRESENCE OF ULCERS (sore throat, sores in mouth, or a toothache) . UNUSUAL RASH, SWELLING OR PAIN  . UNUSUAL VAGINAL DISCHARGE OR ITCHING   Items with * indicate a potential emergency and should be followed up as soon as possible or go to the Emergency Department if any problems should occur.  Please show the CHEMOTHERAPY ALERT CARD or IMMUNOTHERAPY ALERT CARD at check-in to the Emergency Department  and triage nurse.  Should you have questions after your visit or need to cancel or reschedule your appointment, please contact [facility] [phone]  and follow the prompts.  Office hours are 8:00 a.m. to 4:30 p.m. Monday - Friday. Please note that voicemails left after 4:00 p.m. may not be returned until the following business day.  We are closed weekends and major holidays. You have access to a nurse at all times for urgent questions. Please call the main number to the clinic [phone] and follow the prompts.  For any non-urgent questions, you may also contact your provider using MyChart. We now offer e-Visits for anyone 18 and older to request care online for non-urgent symptoms. For details visit [email or web address].   Also download the MyChart app! Go to the app store, search "MyChart", open the app, select [facility], and log in with your MyChart username and password.  Due to Covid, a mask is required upon entering the hospital/clinic. If you do not have a mask, one will be given to you upon arrival. For doctor visits, patients may have 1 support person aged 18 or older with them. For treatment visits, patients cannot have anyone with them due to current Covid guidelines and our immunocompromised population.  

## 2021-01-24 NOTE — Progress Notes (Signed)
Patient tolerated iron infusion with no complaints voiced.  Peripheral IV site clean and dry with good blood return noted before and after infusion.  Band aid applied.  VSS with discharge and left in satisfactory condition with no s/s of distress noted.   

## 2021-01-28 ENCOUNTER — Inpatient Hospital Stay (HOSPITAL_COMMUNITY): Payer: Medicaid Other

## 2021-01-28 ENCOUNTER — Other Ambulatory Visit: Payer: Self-pay

## 2021-01-28 ENCOUNTER — Encounter (HOSPITAL_COMMUNITY): Payer: Self-pay

## 2021-01-28 VITALS — BP 126/82 | HR 59 | Temp 97.4°F | Resp 18

## 2021-01-28 DIAGNOSIS — D500 Iron deficiency anemia secondary to blood loss (chronic): Secondary | ICD-10-CM | POA: Diagnosis not present

## 2021-01-28 MED ORDER — SODIUM CHLORIDE 0.9 % IV SOLN: Freq: Once | INTRAVENOUS | Status: AC

## 2021-01-28 MED ORDER — ACETAMINOPHEN 325 MG PO TABS
650.0000 mg | ORAL_TABLET | Freq: Once | ORAL | Status: AC
  Administered 2021-01-28: 650 mg via ORAL

## 2021-01-28 MED ORDER — LORATADINE 10 MG PO TABS
10.0000 mg | ORAL_TABLET | Freq: Once | ORAL | Status: AC
  Administered 2021-01-28: 10 mg via ORAL

## 2021-01-28 MED ORDER — LORATADINE 10 MG PO TABS
ORAL_TABLET | ORAL | Status: AC
  Filled 2021-01-28: qty 1

## 2021-01-28 MED ORDER — IRON SUCROSE 20 MG/ML IV SOLN
200.0000 mg | Freq: Once | INTRAVENOUS | Status: AC
  Administered 2021-01-28: 200 mg via INTRAVENOUS
  Filled 2021-01-28: qty 200

## 2021-01-28 MED ORDER — ACETAMINOPHEN 325 MG PO TABS
ORAL_TABLET | ORAL | Status: AC
  Filled 2021-01-28: qty 2

## 2021-01-28 NOTE — Progress Notes (Signed)
Venofer given today per MD orders. Tolerated infusion without adverse affects. Vital signs stable. No complaints at this time. Discharged from clinic ambulatory in stable condition. Alert and oriented x 3. F/U with [facility] as scheduled.  

## 2021-01-28 NOTE — Patient Instructions (Signed)
[facility]  Discharge Instructions: Thank you for choosing [facility] to provide your oncology and hematology care.  If you have a lab appointment with the Cancer Center, please come in thru the Main Entrance and check in at the main information desk.  Wear comfortable clothing and clothing appropriate for easy access to any Portacath or PICC line.   We strive to give you quality time with your provider. You may need to reschedule your appointment if you arrive late (15 or more minutes).  Arriving late affects you and other patients whose appointments are after yours.  Also, if you miss three or more appointments without notifying the office, you may be dismissed from the clinic at the provider's discretion.      For prescription refill requests, have your pharmacy contact our office and allow 72 hours for refills to be completed.    Today you received the following Venofer infusion     BELOW ARE SYMPTOMS THAT SHOULD BE REPORTED IMMEDIATELY: *FEVER GREATER THAN 100.4 F (38 C) OR HIGHER *CHILLS OR SWEATING *NAUSEA AND VOMITING THAT IS NOT CONTROLLED WITH YOUR NAUSEA MEDICATION *UNUSUAL SHORTNESS OF BREATH *UNUSUAL BRUISING OR BLEEDING *URINARY PROBLEMS (pain or burning when urinating, or frequent urination) *BOWEL PROBLEMS (unusual diarrhea, constipation, pain near the anus) TENDERNESS IN MOUTH AND THROAT WITH OR WITHOUT PRESENCE OF ULCERS (sore throat, sores in mouth, or a toothache) UNUSUAL RASH, SWELLING OR PAIN  UNUSUAL VAGINAL DISCHARGE OR ITCHING   Items with * indicate a potential emergency and should be followed up as soon as possible or go to the Emergency Department if any problems should occur.  Please show the CHEMOTHERAPY ALERT CARD or IMMUNOTHERAPY ALERT CARD at check-in to the Emergency Department and triage nurse.  Should you have questions after your visit or need to cancel or reschedule your appointment, please contact [facility] [phone]  and follow the prompts.  Office hours are 8:00 a.m. to 4:30 p.m. Monday - Friday. Please note that voicemails left after 4:00 p.m. may not be returned until the following business day.  We are closed weekends and major holidays. You have access to a nurse at all times for urgent questions. Please call the main number to the clinic [phone] and follow the prompts.  For any non-urgent questions, you may also contact your provider using MyChart. We now offer e-Visits for anyone 18 and older to request care online for non-urgent symptoms. For details visit [email or web address].   Also download the MyChart app! Go to the app store, search "MyChart", open the app, select [facility], and log in with your MyChart username and password.  Due to Covid, a mask is required upon entering the hospital/clinic. If you do not have a mask, one will be given to you upon arrival. For doctor visits, patients may have 1 support person aged 18 or older with them. For treatment visits, patients cannot have anyone with them due to current Covid guidelines and our immunocompromised population.  

## 2021-01-31 ENCOUNTER — Inpatient Hospital Stay (HOSPITAL_COMMUNITY): Payer: Medicaid Other

## 2021-01-31 ENCOUNTER — Encounter (HOSPITAL_COMMUNITY): Payer: Self-pay

## 2021-01-31 ENCOUNTER — Other Ambulatory Visit: Payer: Self-pay

## 2021-01-31 VITALS — BP 119/66 | HR 68 | Temp 97.2°F | Resp 18

## 2021-01-31 DIAGNOSIS — D500 Iron deficiency anemia secondary to blood loss (chronic): Secondary | ICD-10-CM | POA: Diagnosis not present

## 2021-01-31 MED ORDER — LORATADINE 10 MG PO TABS
10.0000 mg | ORAL_TABLET | Freq: Once | ORAL | Status: AC
  Administered 2021-01-31: 10 mg via ORAL

## 2021-01-31 MED ORDER — ACETAMINOPHEN 325 MG PO TABS
ORAL_TABLET | ORAL | Status: AC
  Filled 2021-01-31: qty 2

## 2021-01-31 MED ORDER — SODIUM CHLORIDE 0.9 % IV SOLN: Freq: Once | INTRAVENOUS | Status: AC

## 2021-01-31 MED ORDER — SODIUM CHLORIDE 0.9 % IV SOLN
200.0000 mg | Freq: Once | INTRAVENOUS | Status: AC
Start: 2021-01-31 — End: 2021-01-31
  Administered 2021-01-31: 200 mg via INTRAVENOUS
  Filled 2021-01-31: qty 200

## 2021-01-31 MED ORDER — ACETAMINOPHEN 325 MG PO TABS
650.0000 mg | ORAL_TABLET | Freq: Once | ORAL | Status: AC
  Administered 2021-01-31: 650 mg via ORAL

## 2021-01-31 MED ORDER — LORATADINE 10 MG PO TABS
ORAL_TABLET | ORAL | Status: AC
  Filled 2021-01-31: qty 1

## 2021-01-31 NOTE — Progress Notes (Signed)
Iron infusion given per orders. Patient tolerated it well without problems. Vitals stable and discharged home from clinic ambulatory. Follow up as scheduled.  

## 2021-01-31 NOTE — Patient Instructions (Signed)
[facility]  Discharge Instructions: Thank you for choosing [facility] to provide your oncology and hematology care.  If you have a lab appointment with the Cancer Center, please come in thru the Main Entrance and check in at the main information desk.  Wear comfortable clothing and clothing appropriate for easy access to any Portacath or PICC line.   We strive to give you quality time with your provider. You may need to reschedule your appointment if you arrive late (15 or more minutes).  Arriving late affects you and other patients whose appointments are after yours.  Also, if you miss three or more appointments without notifying the office, you may be dismissed from the clinic at the provider's discretion.      For prescription refill requests, have your pharmacy contact our office and allow 72 hours for refills to be completed.    Today you received the following: iron infusion    .   Items with * indicate a potential emergency and should be followed up as soon as possible or go to the Emergency Department if any problems should occur.    Should you have questions after your visit or need to cancel or reschedule your appointment, please contact [facility] [phone]  and follow the prompts.  Office hours are 8:00 a.m. to 4:30 p.m. Monday - Friday. Please note that voicemails left after 4:00 p.m. may not be returned until the following business day.  We are closed weekends and major holidays. You have access to a nurse at all times for urgent questions. Please call the main number to the clinic [phone] and follow the prompts.  For any non-urgent questions, you may also contact your provider using MyChart. We now offer e-Visits for anyone 18 and older to request care online for non-urgent symptoms. For details visit [email or web address].   Also download the MyChart app! Go to the app store, search "MyChart", open the app, select ,  and log in with your MyChart username and password.  Due to Covid, a mask is required upon entering the hospital/clinic. If you do not have a mask, one will be given to you upon arrival. For doctor visits, patients may have 1 support person aged 18 or older with them. For treatment visits, patients cannot have anyone with them due to current Covid guidelines and our immunocompromised population.  

## 2021-02-01 ENCOUNTER — Telehealth: Payer: Self-pay | Admitting: *Deleted

## 2021-02-01 NOTE — Telephone Encounter (Signed)
Called pt, Givens capsule rescheduled to 02/14/21 at 7:30am. New instructions mailed. Endo scheduler informed.   Message sent to Neil Crouch PA (she will be reading G[facility]).

## 2021-02-01 NOTE — Telephone Encounter (Signed)
Pt called in and would like to reschedule her givens capsule study. Requesting Mon or Fri. [phone]

## 2021-02-07 ENCOUNTER — Other Ambulatory Visit: Payer: Self-pay

## 2021-02-07 ENCOUNTER — Encounter (INDEPENDENT_AMBULATORY_CARE_PROVIDER_SITE_OTHER): Payer: Self-pay | Admitting: Internal Medicine

## 2021-02-07 ENCOUNTER — Ambulatory Visit (INDEPENDENT_AMBULATORY_CARE_PROVIDER_SITE_OTHER): Payer: Medicaid Other | Admitting: Internal Medicine

## 2021-02-07 VITALS — BP 126/77 | HR 77 | Temp 97.3°F | Resp 18 | Ht 61.0 in | Wt 145.4 lb

## 2021-02-07 DIAGNOSIS — E039 Hypothyroidism, unspecified: Secondary | ICD-10-CM

## 2021-02-07 NOTE — Patient Instructions (Signed)
[facility] Dietary Recommendations for Weight Loss What to Avoid . Avoid added sugars o Often added sugar can be found in processed foods such as many condiments, dry cereals, cakes, cookies, chips, crisps, crackers, candies, sweetened drinks, etc.  o Read labels and AVOID/DECREASE use of foods with the following in their ingredient list: Sugar, fructose, high fructose corn syrup, sucrose, glucose, maltose, dextrose, molasses, cane sugar, brown sugar, any type of syrup, agave nectar, etc.   . Avoid snacking in between meals . Avoid foods made with flour o If you are going to eat food made with flour, choose those made with whole-grains; and, minimize your consumption as much as is tolerable . Avoid processed foods o These foods are generally stocked in the middle of the grocery store. Focus on shopping on the perimeter of the grocery.  . Avoid Meat  o We recommend following a plant-based diet at [facility]. Thus, we recommend avoiding meat as a general rule. Consider eating beans, legumes, eggs, and/or dairy products for regular protein sources o If you plan on eating meat limit to 4 ounces of meat at a time and choose lean options such as Fish, chicken, turkey. Avoid red meat intake such as pork and/or steak What to Include . Vegetables o GREEN LEAFY VEGETABLES: Kale, spinach, mustard greens, collard greens, cabbage, broccoli, etc. o OTHER: Asparagus, cauliflower, eggplant, carrots, peas, Brussel sprouts, tomatoes, bell peppers, zucchini, beets, cucumbers, etc. . Grains, seeds, and legumes o Beans: kidney beans, black eyed peas, garbanzo beans, black beans, pinto beans, etc. o Whole, unrefined grains: brown rice, barley, bulgur, oatmeal, etc. . Healthy fats  o Avoid highly processed fats such as vegetable oil o Examples of healthy fats: avocado, olives, virgin olive oil, dark chocolate (?72% Cocoa), nuts (peanuts, almonds, walnuts, cashews, pecans, etc.) . None to Low  Intake of Animal Sources of Protein o Meat sources: chicken, turkey, salmon, tuna. Limit to 4 ounces of meat at one time. o Consider limiting dairy sources, but when choosing dairy focus on: PLAIN Greek yogurt, cottage cheese, high-protein milk . Fruit o Choose berries  When to Eat . Intermittent Fasting: o Choosing not to eat for a specific time period, but DO FOCUS ON HYDRATION when fasting o Multiple Techniques: - Time Restricted Eating: eat 3 meals in a day, each meal lasting no more than 60 minutes, no snacks between meals - 16-18 hour fast: fast for 16 to 18 hours up to 7 days a week. Often suggested to start with 2-3 nonconsecutive days per week.  . Remember the time you sleep is counted as fasting.  . Examples of eating schedule: Fast from 7:00pm-11:00am. Eat between 11:00am-7:00pm.  - 24-hour fast: fast for 24 hours up to every other day. Often suggested to start with 1 day per week . Remember the time you sleep is counted as fasting . Examples of eating schedule:  o Eating day: eat 2-3 meals on your eating day. If doing 2 meals, each meal should last no more than 90 minutes. If doing 3 meals, each meal should last no more than 60 minutes. Finish last meal by 7:00pm. o Fasting day: Fast until 7:00pm.  o IF YOU FEEL UNWELL FOR ANY REASON/IN ANY WAY WHEN FASTING, STOP FASTING BY EATING A NUTRITIOUS SNACK OR LIGHT MEAL o ALWAYS FOCUS ON HYDRATION DURING FASTS - Acceptable Hydration sources: water, broths, tea/coffee (black tea/coffee is best but using a small amount of whole-fat dairy products in coffee/tea is acceptable).  -   Poor Hydration Sources: anything with sugar or artificial sweeteners added to it  These recommendations have been developed for patients that are actively receiving medical care from either Dr. Jearline Hirschhorn or Sarah Gray, DNP, NP-C at [facility]. These recommendations are developed for patients with specific medical conditions and are not meant to be  distributed or used by others that are not actively receiving care from either provider listed above at [facility]. It is not appropriate to participate in the above eating plans without proper medical supervision.   Reference: Fung, J. The obesity code. [place]: [place]; 2016.   

## 2021-02-07 NOTE — Progress Notes (Signed)
Metrics: Intervention Frequency ACO  Documented Smoking Status Yearly  Screened one or more times in 24 months  Cessation Counseling or  Active cessation medication Past 24 months  Past 24 months   Guideline developer: UpToDate (See UpToDate for funding source) Date Released: 2014       Wellness Office Visit  Subjective:  Patient ID: [facility], female    DOB: Jan 24, 1961  Age: 60 y.o. MRN: [id number]  CC: This lady comes in for follow-up of hypothyroidism. HPI  She did increase the Cytomel dose as requested so now she is taking levothyroxine 50 mg in the morning combined with the Cytomel 10 mcg in the morning.  She seems to have tolerated these doses. Past Medical History:  Diagnosis Date  . Allergy   . Anemia    thalcemia  . Anxiety   . Arthritis   . Cancer [facility])    Cervical cancer  . COPD (chronic obstructive pulmonary disease) ([place])   . Depression   . Dyspnea   . GERD (gastroesophageal reflux disease)   . Hyperlipidemia   . Hypothyroidism    Past Surgical History:  Procedure Laterality Date  . carpel tunnel release     bilaterally  . CESAREAN SECTION    . COLONOSCOPY N/A 06/22/2014   Dr. Rourk:External and internal hemorrhoids-grade 4; otherwise normal rectum total colon and terminal ileum.  . COLONOSCOPY WITH PROPOFOL N/A 11/26/2017   external and internal hemorrhoids, prominent anal papilla, normal rectum and colon, normal TI.   Marland Kitchen COLPOSCOPY    . ESOPHAGOGASTRODUODENOSCOPY N/A 06/22/2014   Dr. Gala Romney:The mucosa of the esophagus appeared normal  Single gastric ulcer ranging between 3-5 mm in size was found   Biopsy with negative H.pylori.   . ESOPHAGOGASTRODUODENOSCOPY N/A 10/28/2014   Dr. Gala Romney: patulous EG junction, small hiatal hernia, previous noted gastric ulcer healed  . ESOPHAGOGASTRODUODENOSCOPY (EGD) WITH PROPOFOL N/A 11/26/2017   normal esophagus, normal stomach, normal duodenum.   Marland Kitchen VAGINAL HYSTERECTOMY     bleeding     Family History  Problem  Relation Age of Onset  . Uterine cancer Mother   . Heart disease Mother   . Cancer Mother   . Alcohol abuse Mother   . Arthritis Mother   . COPD Mother   . Depression Mother   . Heart disease Father        age 74, family questions autopsy  . Alcohol abuse Father   . Arthritis Father   . Depression Father   . Early death Father   . Cancer Other        ulcer, aunt  . Lung cancer Cousin   . Throat cancer Cousin   . Asthma Son   . COPD Sister   . Cancer Maternal Aunt        ulcer  . Colon cancer Neg Hx   . Ulcers Neg Hx     Social History   Social History Narrative   Visits sister - stays at home   Social History   Tobacco Use  . Smoking status: Former Smoker    Packs/day: 0.25    Years: 40.00    Pack years: 10.00    Start date: 09/25/1973    Quit date: 05/17/2020    Years since quitting: 0.7  . Smokeless tobacco: Never Used  Substance Use Topics  . Alcohol use: No    Current Meds  Medication Sig  . Biotin 10000 MCG TABS Take 10,000 Units by mouth daily.  . Cholecalciferol (VITAMIN  D-3) 125 MCG (5000 UT) TABS Take 2 tablets by mouth daily.  Marland Kitchen desvenlafaxine (PRISTIQ) 50 MG 24 hr tablet TAKE 1 TABLET BY MOUTH EVERY DAY  . levothyroxine (SYNTHROID) 50 MCG tablet TAKE 1 TABLET BY MOUTH EVERY DAY  . liothyronine (CYTOMEL) 5 MCG tablet Take 2 tablets (10 mcg total) by mouth daily.  . Omega-3 Fatty Acids (FISH OIL) 1000 MG CAPS Take by mouth daily.   Marland Kitchen omeprazole (PRILOSEC) 40 MG capsule TAKE 1 CAPSULE BY MOUTH EVERY DAY (Patient taking differently: in the morning and at bedtime.)  . PROAIR HFA 108 (90 Base) MCG/ACT inhaler TAKE 2 PUFFS BY MOUTH EVERY 6 HOURS AS NEEDED FOR WHEEZE OR SHORTNESS OF BREATH  . TRULANCE 3 MG TABS Take 1 tablet by mouth daily.  . vitamin B-12 (CYANOCOBALAMIN) 250 MCG tablet Take 250 mcg by mouth daily.     [place] Office Visit from 11/17/2020 in [facility]  PHQ-9 Total Score 0      Objective:   Today's Vitals: BP  126/77 (BP Location: Left Arm, Patient Position: Sitting, Cuff Size: Small)   Pulse 77   Temp (!) 97.3 F (36.3 C) (Temporal)   Resp 18   Ht 5\' 1"  (1.549 m)   Wt 145 lb 6.4 oz (66 kg)   SpO2 99%   BMI 27.47 kg/m  Vitals with BMI 02/07/2021 01/31/2021 01/31/2021  Height 5\' 1"  - -  Weight 145 lbs 6 oz - -  BMI 00.17 - -  Systolic 494 496 759  Diastolic 77 66 51  Pulse 77 68 76     Physical Exam  General systemically well, overweight.     Assessment   1. Acquired hypothyroidism       Tests ordered Orders Placed This Encounter  Procedures  . T3, free  . T4, free  . TSH     Plan: 1. Continue with same dose of levothyroxine and Cytomel and we will check thyroid function test today. 2. She wanted to inquire about supplement for liver function.  She wants to lose weight and visceral fat.  We discussed nutrition with the concept of intermittent fasting and a plant-based diet.  We also discussed exercise in terms of strength training exercises. 3. Follow-up in 4 months for follow-up at further recommendations will depend on blood results.   No orders of the defined types were placed in this encounter.   Doree Albee, MD

## 2021-02-08 ENCOUNTER — Inpatient Hospital Stay: Payer: Medicaid Other | Attending: Genetic Counselor | Admitting: Genetic Counselor

## 2021-02-08 ENCOUNTER — Telehealth: Payer: Self-pay | Admitting: Genetic Counselor

## 2021-02-08 ENCOUNTER — Other Ambulatory Visit (INDEPENDENT_AMBULATORY_CARE_PROVIDER_SITE_OTHER): Payer: Self-pay | Admitting: Internal Medicine

## 2021-02-08 ENCOUNTER — Inpatient Hospital Stay: Payer: Medicaid Other

## 2021-02-08 ENCOUNTER — Encounter: Payer: Self-pay | Admitting: Genetic Counselor

## 2021-02-08 DIAGNOSIS — Z808 Family history of malignant neoplasm of other organs or systems: Secondary | ICD-10-CM | POA: Insufficient documentation

## 2021-02-08 DIAGNOSIS — Z801 Family history of malignant neoplasm of trachea, bronchus and lung: Secondary | ICD-10-CM

## 2021-02-08 DIAGNOSIS — Z803 Family history of malignant neoplasm of breast: Secondary | ICD-10-CM

## 2021-02-08 DIAGNOSIS — Z8041 Family history of malignant neoplasm of ovary: Secondary | ICD-10-CM | POA: Diagnosis not present

## 2021-02-08 DIAGNOSIS — C6990 Malignant neoplasm of unspecified site of unspecified eye: Secondary | ICD-10-CM

## 2021-02-08 LAB — T4, FREE: Free T4: 1 ng/dL (ref 0.8–1.8)

## 2021-02-08 LAB — TSH: TSH: 3.63 mIU/L (ref 0.40–4.50)

## 2021-02-08 LAB — T3, FREE: T3, Free: 3 pg/mL (ref 2.3–4.2)

## 2021-02-08 MED ORDER — DESVENLAFAXINE SUCCINATE ER 50 MG PO TB24: 50.0000 mg | ORAL_TABLET | Freq: Every day | ORAL | 1 refills | Status: DC

## 2021-02-08 NOTE — Progress Notes (Signed)
REFERRING PROVIDER: Rachel Moulds, MD [address] [place],  [place] [address]  PRIMARY PROVIDER:  Wilson Singer, MD  PRIMARY REASON FOR VISIT:  1. Family history of ovarian cancer   2. Family history of breast cancer   3. Malignant melanoma of eye, unspecified laterality (HCC)   4. Family history of skin cancer   5. Family history of lung cancer      I connected with [facility] on 02/08/2021 at 9:00 am EDT by video conference and verified that I am speaking with the correct person using two identifiers.   Patient location: Home Provider location: [facility]  HISTORY OF PRESENT ILLNESS:   [facility], a 60 y.o. female, was seen for a [place] cancer genetics consultation at the request of Dr. Al Pimple due to a personal and family history of cancer.  [facility] presents to clinic today to discuss the possibility of a hereditary predisposition to cancer, genetic testing, and to further clarify her future cancer risks, as well as potential cancer risks for family members.   [facility] reports a personal history of a melanoma behind the eye diagnosed at least 10 years ago.  She also recently noticed a mole that has changed in appearance.   RISK FACTORS:  Menarche was at age 42.  First live birth at age 79.  OCP use for approximately 12 years.  Ovaries intact: yes.  Hysterectomy: yes.  Menopausal status: postmenopausal.  HRT use: 0 years. Colonoscopy: yes; 11/26/2017. Mammogram within the last year: yes. Number of breast biopsies: 1. Any excessive radiation exposure in the past: no.   Past Medical History:  Diagnosis Date  . Allergy   . Anemia    thalcemia  . Anxiety   . Arthritis   . Cancer [facility])    Cervical cancer  . COPD (chronic obstructive pulmonary disease) (HCC)   . Depression   . Dyspnea   . Family history of breast cancer   . Family history of lung cancer   . Family history of ovarian cancer   . Family history of skin cancer   . GERD  (gastroesophageal reflux disease)   . Hyperlipidemia   . Hypothyroidism   . 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 carpel tunnel release     bilaterally  . CESAREAN SECTION    . COLONOSCOPY N/A 06/22/2014   Dr. Rourk:External and internal hemorrhoids-grade 4; otherwise normal rectum total colon and terminal ileum.  . COLONOSCOPY WITH PROPOFOL N/A 11/26/2017   external and internal hemorrhoids, prominent anal papilla, normal rectum and colon, normal TI.   Marland Kitchen COLPOSCOPY    . ESOPHAGOGASTRODUODENOSCOPY N/A 06/22/2014   Dr. Jena Gauss:The mucosa of the esophagus appeared normal  Single gastric ulcer ranging between 3-5 mm in size was found   Biopsy with negative H.pylori.   . ESOPHAGOGASTRODUODENOSCOPY N/A 10/28/2014   Dr. Jena Gauss: patulous EG junction, small hiatal hernia, previous noted gastric ulcer healed  . ESOPHAGOGASTRODUODENOSCOPY (EGD) WITH PROPOFOL N/A 11/26/2017   normal esophagus, normal stomach, normal duodenum.   Marland Kitchen VAGINAL HYSTERECTOMY     bleeding    Social History   Socioeconomic History  . Marital status: Legally Separated    Spouse name: Iantha Fallen  . Number of children: 2  . Years of education: 10  . Highest education level: Not on file  Occupational History  . Occupation: not working    Comment: cleaned houses  Comment: disabled from arthritis  Tobacco Use  . Smoking status: Former Smoker    Packs/day: 0.25    Years: 40.00    Pack years: 10.00    Start date: 09/25/1973    Quit date: 05/17/2020    Years since quitting: 0.7  . Smokeless tobacco: Never Used  Vaping Use  . Vaping Use: Never used  Substance and Sexual Activity  . Alcohol use: No  . Drug use: No  . Sexual activity: Not Currently  Other Topics Concern  . Not on file  Social History Narrative   Visits sister - stays at home   Social Determinants of Health   Financial Resource Strain: Not on file  Food Insecurity: Not on file  Transportation  Needs: Not on file  Physical Activity: Not on file  Stress: Not on file  Social Connections: Not on file     FAMILY HISTORY:  We obtained a detailed, 4-generation family history.  Significant diagnoses are listed below: Family History  Problem Relation Age of Onset  . Uterine cancer Mother   . Heart disease Mother   . Alcohol abuse Mother   . Arthritis Mother   . COPD Mother   . Depression Mother   . Ovarian cancer Mother        dx 101s  . Heart disease Father        age 72, family questions autopsy  . Alcohol abuse Father   . Arthritis Father   . Depression Father   . Early death Father   . Cancer Other        ulcer, aunt  . Lung cancer Cousin        dx 16s, then again in her 62s (maternal first cousin)  . Throat cancer Cousin   . Asthma Son   . COPD Sister   . Lung cancer Maternal Aunt        dx 60s/70s, smoker, ulcer  . Cancer Maternal Uncle        dx 3s, unknown primary (affected eye/head, colon, stomach), chewed tobacco  . Gout Paternal Uncle   . Seizures Paternal Uncle   . Lung cancer Maternal Aunt        dx 60s, non smoker  . Cancer Maternal Aunt        dx 70s, mouth cancer, chewed tobacco  . Skin cancer Maternal Uncle   . Skin cancer Maternal Uncle   . Breast cancer Niece 57  . Cancer Niece        unknown type, dx 47s  . Lung cancer Cousin        dx late 32s (maternal first cousin)  . Skin cancer Cousin        multiple (maternal first cousin)  . Colon cancer Neg Hx   . Ulcers Neg Hx    Ms. Harold has one daughter (age 68) and one son (age 40). She has two full-sisters (ages 52 and 72) and one maternal half-sister (age 70). One niece had breast cancer diagnosed around age 29. Another niece had cancer (unknown primary) diagnosed in her 97s.  [facility] mother died at age 80 and had a history of ovarian cancer diagnosed in her 71s. There were four maternal aunts and six maternal uncles. Three aunts had cancer - one had lung cancer in her 47s or 25s and  was a smoker, a second had lung cancer in her 72s and was not a smoker, and the third had an oral cancer in her 52s and  chewed tobacco. Three uncles had cancer - one had an unknown cancer that affected his eye/head, colon, and stomach, and the other two had skin cancer. Three maternal cousins had cancer - one had lung cancer diagnosed twice (first in her 69s, then again in her late 19s) and was a smoker, a second had lung cancer in his late 34s, and the third has had multiple skin cancers. Ms. Fitzgibbons maternal grandmother died in her 52s or 65s without cancer. Her maternal grandfather died in his 86s without cancer.  [facility] father died at age 51 without cancer. There was one paternal uncle, who did not have cancer. There is no known cancer among paternal cousins. [facility] paternal grandmother died in her 24s without cancer. Her paternal grandfather died in his 72s without cancer.  [facility] is unaware of previous family history of genetic testing for hereditary cancer risks. Patient's maternal ancestors are of Native American and otherwise unknown descent, and paternal ancestors are of unknown descent. There is no reported Ashkenazi Jewish ancestry. There is no known consanguinity.  GENETIC COUNSELING ASSESSMENT: [facility] is a 60 y.o. female with a personal and family history of cancer which is somewhat suggestive of a hereditary cancer syndrome and predisposition to cancer. We, therefore, discussed and recommended the following at today's visit.   DISCUSSION:  Approximately 5-10% of cancer is hereditary, meaning that it is due to a mutation in a single gene that is passed down from generation to generation in a family. A larger proportion of ovarian cancer will have a hereditary cause, with approximately 15-20% of individuals with ovarian cancer having a hereditary cancer gene mutation. Most hereditary cases of breast and ovarian cancer are associated with the BRCA1 and BRCA2 genes, although  there are other genes that can be associated an increased risk for breast and/or ovarian cancer. These include ATM, CHEK2, PALB2, BRIP1, RAD51C, RAD51D, etc. We discussed that testing can be beneficial for several reasons, including knowing about other cancer risks, identifying potential screening and risk-reduction options that may be appropriate, and to understand if other family members could be at risk for cancer and allow them to undergo genetic testing.   We reviewed the characteristics, features and inheritance patterns of hereditary cancer syndromes. We also discussed genetic testing, including the appropriate family members to test, the process of testing, insurance coverage and turn-around-time for results. We discussed the implications of a negative, positive and/or variant of uncertain significant result. We recommended [facility] pursue genetic testing for the [facility] Multi-Cancer + RNA panel.   The Multi-Cancer + RNA Panel offered by [facility] includes sequencing and/or deletion/duplication analysis of the following 84 genes:  AIP*, ALK, APC*, ATM*, AXIN2*, BAP1*, BARD1*, BLM*, BMPR1A*, BRCA1*, BRCA2*, BRIP1*, CASR, CDC73*, CDH1*, CDK4, CDKN1B*, CDKN1C*, CDKN2A, CEBPA, CHEK2*, CTNNA1*, DICER1*, DIS3L2*, EGFR, EPCAM, FH*, FLCN*, GATA2*, GPC3, GREM1, HOXB13, HRAS, KIT, MAX*, MEN1*, MET, MITF, MLH1*, MSH2*, MSH3*, MSH6*, MUTYH*, NBN*, NF1*, NF2*, NTHL1*, PALB2*, PDGFRA, PHOX2B, PMS2*, POLD1*, POLE*, POT1*, PRKAR1A*, PTCH1*, PTEN*, RAD50*, RAD51C*, RAD51D*, RB1*, RECQL4, RET, RUNX1*, SDHA*, SDHAF2*, SDHB*, SDHC*, SDHD*, SMAD4*, SMARCA4*, SMARCB1*, SMARCE1*, STK11*, SUFU*, TERC, TERT, TMEM127*, Tp53*, TSC1*, TSC2*, VHL*, WRN*, and WT1.  RNA analysis is performed for * genes.   Based on [facility]'s personal and family history of cancer, she meets medical criteria for genetic testing. Despite that she meets criteria, there may still be an out of pocket cost. [facility] expressed some concern about the  potential cost of testing. Therefore, we will request a  pre-test benefits investigation from [facility] prior to placing the test order.   PLAN: We will request a pre-test benefits investigation from [facility] to better estimate the out of pocket cost for genetic testing. Once this estimate is available, we will call [facility] and she will decide whether she would like to proceed with testing at that time. If [facility] chooses to proceed with testing, the blood sample will be sent to [facility] for analysis of the Multi-Cancer + RNA panel. Once received, results should be available within approximately two-three weeks' time, at which point they will be disclosed by telephone to [facility], as will any additional recommendations warranted by these results. [facility] will receive a summary of her genetic counseling visit and a copy of her results once available. This information will also be available in Epic.  We strongly encouraged [facility] to discuss her recently changing mole with her primary care physician and/or a dermatologist.  K[facility] questions were answered to her satisfaction today. Our contact information was provided should additional questions or concerns arise. Thank you for the referral and allowing [place] to share in the care of your patient.   Clint Guy, [place], [facility] Licensed, Certified Dispensing [email or web address] Phone: [phone]  The patient was seen for a total of 60 minutes in face-to-face genetic counseling.  This patient was discussed with Drs. Magrinat, Lindi Adie and/or Burr Medico who agrees with the above.    _______________________________________________________________________ For Office Staff:  Number of people involved in session: 1 Was an Intern/ student involved with case: no

## 2021-02-08 NOTE — Telephone Encounter (Signed)
Discussed that the estimated out of pocket cost for genetic testing will be $0 through [facility]. [facility] is comfortable proceeding with testing at this time. We will call her once the results are available.

## 2021-02-11 ENCOUNTER — Other Ambulatory Visit: Payer: Self-pay

## 2021-02-11 ENCOUNTER — Inpatient Hospital Stay (HOSPITAL_COMMUNITY): Payer: Medicaid Other

## 2021-02-11 DIAGNOSIS — C6990 Malignant neoplasm of unspecified site of unspecified eye: Secondary | ICD-10-CM | POA: Diagnosis not present

## 2021-02-11 DIAGNOSIS — Z8041 Family history of malignant neoplasm of ovary: Secondary | ICD-10-CM | POA: Diagnosis not present

## 2021-02-11 DIAGNOSIS — Z803 Family history of malignant neoplasm of breast: Secondary | ICD-10-CM | POA: Diagnosis not present

## 2021-02-14 ENCOUNTER — Encounter (HOSPITAL_COMMUNITY)
Admission: RE | Disposition: A | Discharge status: Home / Self Care | Payer: Self-pay | Source: Home / Self Care | Attending: Internal Medicine

## 2021-02-14 ENCOUNTER — Ambulatory Visit (HOSPITAL_COMMUNITY)
Admission: RE | Admit: 2021-02-14 | Discharge: 2021-02-14 | Disposition: A | Discharge status: Home / Self Care | Payer: Medicaid Other | Attending: Internal Medicine | Admitting: Internal Medicine

## 2021-02-14 DIAGNOSIS — Z8711 Personal history of peptic ulcer disease: Secondary | ICD-10-CM | POA: Insufficient documentation

## 2021-02-14 DIAGNOSIS — D509 Iron deficiency anemia, unspecified: Secondary | ICD-10-CM | POA: Diagnosis not present

## 2021-02-14 DIAGNOSIS — K259 Gastric ulcer, unspecified as acute or chronic, without hemorrhage or perforation: Secondary | ICD-10-CM | POA: Insufficient documentation

## 2021-02-14 DIAGNOSIS — D500 Iron deficiency anemia secondary to blood loss (chronic): Secondary | ICD-10-CM

## 2021-02-14 HISTORY — PX: GIVENS CAPSULE STUDY: SHX5432

## 2021-02-14 SURGERY — IMAGING PROCEDURE, GI TRACT, INTRALUMINAL, VIA CAPSULE

## 2021-02-16 ENCOUNTER — Ambulatory Visit: Payer: Medicaid Other | Admitting: Cardiology

## 2021-02-16 ENCOUNTER — Encounter (HOSPITAL_COMMUNITY): Payer: Self-pay | Admitting: Internal Medicine

## 2021-02-20 ENCOUNTER — Other Ambulatory Visit (INDEPENDENT_AMBULATORY_CARE_PROVIDER_SITE_OTHER): Payer: Self-pay | Admitting: Internal Medicine

## 2021-02-22 NOTE — Op Note (Addendum)
  Small Bowel [facility] Capsule Study Procedure date:  02/14/21  Referring Provider:  Roseanne Kaufman, NP PCP:  Dr. Doree Albee, MD  Indication for procedure: 60 year old female with history of IDA, questionable history of thalassemia.  First noted IDA in 2019.  Completed EGD and colonoscopy March 2019.  EGD was unremarkable.  Colonoscopy with external/internal hemorrhoids, normal terminal ileum, colon.  Prior history of gastric ulcer 2015.  Celiac serologies negative previously.  Returned a couple months ago with hemoglobin of 10.3, MCV 62.4, ferritin 3, TIBC 468, iron 67, iron saturations 14%.  Subsequently has followed up with hematology and underwent iron infusions.  Capsule endoscopy being done to further evaluate unexplained IDA.   Patient data:  Wt: 5 feet 1 inches Ht: 45 pounds  Findings: Complete capsule study as indicated by capsule region cecum.  Within the stomach, at 11 minutes 47 seconds, gastric ulcer with some depth but no active bleeding.  Possible ulcer at 21 minutes 30 seconds.  Small bowel erosion of 48 minutes 7 seconds.  Vegetable matter noted throughout the study, for example at 2 hours 36 minutes 36 seconds, 4 hours 7 minutes 25 seconds, 4 hours 9 minutes 18 seconds, 4-hour 17 minutes 6 seconds. Although study not intended to evaluate colon, images reviewed, erythema noted at 5-hour 17 minutes 32 seconds, 6 hours 5 minutes 38 seconds, 6-hour 6 minutes 34 seconds, 6 hours 23 minutes 25 seconds, 6 hours 38 minutes 22 seconds, 6 hours 55 minutes 18 seconds, 7 hours 4 minutes 25 seconds.  First Gastric image: 55 seconds First Duodenal image: 21 minutes 55 seconds First Ileo-Cecal Valve image: 5 hours 15 minutes 23 seconds First Cecal image: 5 hours 16 minutes 6 seconds Gastric Passage time: 21 minutes  Small Bowel Passage time: 4 hours 54 minutes   Summary & Recommendations: Abnormal stomach with at least one nonbleeding gastric ulcer, possibly two.  Nonbleeding proximal small  bowel erosion.  Multiple areas of erythema within the colon noted.  Pertinent images reviewed with Dr. Gala Romney.  Recommend upper endoscopy and ileocolonoscopy to evaluate abnormal findings seen on capsule study and IDA.  Continue to follow with hematology for anemia. Avoid NSAIDs, for example ibuprofen, Naprosyn.  Further recommendations from ordering provider, Roseanne Kaufman, NP.   Laureen Ochs. Bernarda Caffey [facility] [phone] 6/1/20227:42 AM

## 2021-02-23 ENCOUNTER — Telehealth: Payer: Self-pay | Admitting: Gastroenterology

## 2021-02-23 NOTE — Telephone Encounter (Signed)
Please inform patient of the following findings from her capsule endoscopy.   Abnormal stomach with at least one nonbleeding gastric ulcer, possibly two.  Nonbleeding proximal small bowel erosion.  Multiple areas of erythema within the colon noted.  Pertinent images reviewed with Dr. Gala Romney.  1. Recommend upper endoscopy and ileocolonoscopy to evaluate abnormal findings seen on capsule study.  2. Continue to follow with hematology for anemia. 3. Avoid NSAIDs, for example ibuprofen, Naprosyn.  4. Continue omeprazole, but recommend taking twice daily for one month, then once daily. 5. Further recommendations from ordering provider, Roseanne Kaufman, NP.   Would expedite her work up if possible given possible gastric ulcer. If she is willing, first available provider (she is [facility] patient but if she is willing, can have [facility] complete procedures). She will need propofol. ASA III.

## 2021-02-23 NOTE — Telephone Encounter (Signed)
Called pt to schedule. Offered 2 dates with Dr. Abbey Chatters and those would not work for her. Advised will have to wait and call her once I receive July schedule. She also wants a morning appt (no afternoons)

## 2021-02-23 NOTE — Telephone Encounter (Signed)
Spoke with pt.  Made her aware of results and recommendations.  She was advised to avoid NSAIDS.  She was also advised to continue omeprazole twice daily for one month then once daily.  Pt made aware that she needs endoscopy and ileocolonoscopy.  Pt requested to avoid the week of June 13-18 because she will be out of town.  She would like a Mon or Friday if possible.  Pt made aware that we would be calling her to get her scheduled.

## 2021-02-24 ENCOUNTER — Other Ambulatory Visit: Payer: Self-pay

## 2021-02-24 MED ORDER — PEG 3350-KCL-NA BICARB-NACL 420 G PO SOLR
4000.0000 mL | ORAL | 0 refills | Status: DC
Start: 2021-02-24 — End: 2021-06-13

## 2021-02-24 NOTE — Telephone Encounter (Signed)
Called pt, EGD/ileocolonoscopy scheduled with Dr. Gala Romney 04/04/21 at 7:30am. Rx for prep sent to pharmacy. Orders entered.

## 2021-02-24 NOTE — Telephone Encounter (Signed)
Pre-op appt 03/30/21. Appt letter mailed with procedure instructions.

## 2021-03-02 DIAGNOSIS — Z1379 Encounter for other screening for genetic and chromosomal anomalies: Secondary | ICD-10-CM | POA: Insufficient documentation

## 2021-03-03 ENCOUNTER — Telehealth: Payer: Self-pay | Admitting: Genetic Counselor

## 2021-03-03 ENCOUNTER — Ambulatory Visit: Payer: Self-pay | Admitting: Genetic Counselor

## 2021-03-03 ENCOUNTER — Encounter: Payer: Self-pay | Admitting: Genetic Counselor

## 2021-03-03 DIAGNOSIS — Z1379 Encounter for other screening for genetic and chromosomal anomalies: Secondary | ICD-10-CM

## 2021-03-03 NOTE — Progress Notes (Signed)
HPI:  Kathleen Erickson was previously seen in the [place] clinic due to a personal and family history of cancer and concerns regarding a hereditary predisposition to cancer. Please refer to our prior cancer genetics clinic note for more information regarding our discussion, assessment and recommendations, at the time. Ms. Deetz recent genetic test results were disclosed to her, as were recommendations warranted by these results. These results and recommendations are discussed in more detail below.   FAMILY HISTORY:  We obtained a detailed, 4-generation family history.  Significant diagnoses are listed below: Family History  Problem Relation Age of Onset   Uterine cancer Mother    Heart disease Mother    Alcohol abuse Mother    Arthritis Mother    COPD Mother    Depression Mother    Ovarian cancer Mother        dx 17s   Heart disease Father        age 2, family questions autopsy   Alcohol abuse Father    Arthritis Father    Depression Father    Early death Father    Cancer Other        ulcer, aunt   Lung cancer Cousin        dx 71s, then again in her 83s (maternal first cousin)   Throat cancer Cousin    Asthma Son    COPD Sister    Lung cancer Maternal Aunt        dx 60s/70s, smoker, ulcer   Cancer Maternal Uncle        dx 59s, unknown primary (affected eye/head, colon, stomach), chewed tobacco   Gout Paternal Uncle    Seizures Paternal Uncle    Lung cancer Maternal Aunt        dx 41s, non smoker   Cancer Maternal Aunt        dx 17s, mouth cancer, chewed tobacco   Skin cancer Maternal Uncle    Skin cancer Maternal Uncle    Breast cancer Niece 6   Cancer Niece        unknown type, dx 61s   Lung cancer Cousin        dx late 15s (maternal first cousin)   Skin cancer Cousin        multiple (maternal first cousin)   Colon cancer Neg Hx    Ulcers Neg Hx    Ms. Reise has one daughter (age 44) and one son (age 47). She has two full-sisters (ages 71 and 25)  and one maternal half-sister (age 63). One niece had breast cancer diagnosed around age 86. Another niece had cancer (unknown primary) diagnosed in her 17s.   Ms. Leven mother died at age 43 and had a history of ovarian cancer diagnosed in her 16s. There were four maternal aunts and six maternal uncles. Three aunts had cancer - one had lung cancer in her 19s or 68s and was a smoker, a second had lung cancer in her 54s and was not a smoker, and the third had an oral cancer in her 35s and chewed tobacco. Three uncles had cancer - one had an unknown cancer that affected his eye/head, colon, and stomach, and the other two had skin cancer. Three maternal cousins had cancer - one had lung cancer diagnosed twice (first in her 49s, then again in her late 24s) and was a smoker, a second had lung cancer in his late 23s, and the third has had multiple skin cancers. Ms. Inghram  maternal grandmother died in her 20s or 63s without cancer. Her maternal grandfather died in his 47s without cancer.   Ms. Crandle father died at age 32 without cancer. There was one paternal uncle, who did not have cancer. There is no known cancer among paternal cousins. Ms. Carducci paternal grandmother died in her 14s without cancer. Her paternal grandfather died in his 50s without cancer.   Ms. Disbro is unaware of previous family history of genetic testing for hereditary cancer risks. Patient's maternal ancestors are of Native American and otherwise unknown descent, and paternal ancestors are of unknown descent. There is no reported Ashkenazi Jewish ancestry. There is no known consanguinity.  GENETIC TEST RESULTS: Genetic testing reported out on 03/02/2021 through the [facility] Multi-Cancer + RNA panel. No pathogenic variants were detected.   The Multi-Cancer + RNA Panel offered by [facility] includes sequencing and/or deletion/duplication analysis of the following 84 genes:  AIP*, ALK, APC*, ATM*, AXIN2*, BAP1*, BARD1*, BLM*, BMPR1A*,  BRCA1*, BRCA2*, BRIP1*, CASR, CDC73*, CDH1*, CDK4, CDKN1B*, CDKN1C*, CDKN2A, CEBPA, CHEK2*, CTNNA1*, DICER1*, DIS3L2*, EGFR, EPCAM, FH*, FLCN*, GATA2*, GPC3, GREM1, HOXB13, HRAS, KIT, MAX*, MEN1*, MET, MITF, MLH1*, MSH2*, MSH3*, MSH6*, MUTYH*, NBN*, NF1*, NF2*, NTHL1*, PALB2*, PDGFRA, PHOX2B, PMS2*, POLD1*, POLE*, POT1*, PRKAR1A*, PTCH1*, PTEN*, RAD50*, RAD51C*, RAD51D*, RB1*, RECQL4, RET, RUNX1*, SDHA*, SDHAF2*, SDHB*, SDHC*, SDHD*, SMAD4*, SMARCA4*, SMARCB1*, SMARCE1*, STK11*, SUFU*, TERC, TERT, TMEM127*, Tp53*, TSC1*, TSC2*, VHL*, WRN*, and WT1.  RNA analysis is performed for * genes. The test report will be scanned into EPIC and located under the Molecular Pathology section of the Results Review tab.  A portion of the result report is included below for reference.     We discussed with Ms. Willbanks that because current genetic testing is not perfect, it is possible there may be a gene mutation in one of these genes that current testing cannot detect, but that chance is small.  We also discussed that there could be another gene that has not yet been discovered, or that we have not yet tested, that is responsible for the cancer diagnoses in the family. It is also possible there is a hereditary cause for the cancer in the family that Ms. Caven did not inherit and therefore was not identified in her testing.  Therefore, it is important to remain in touch with cancer genetics in the future so that we can continue to offer Ms. Eilers the most up to date genetic testing.   CANCER SCREENING RECOMMENDATIONS: Ms. Saintvil test result is considered negative (normal).  This means that we have not identified a hereditary cause for her personal and family history of cancer at this time. While reassuring, this does not definitively rule out a hereditary predisposition to cancer. It is still possible that there could be genetic mutations that are undetectable by current technology. There could be genetic mutations in genes  that have not been tested or identified to increase cancer risk.  Therefore, it is recommended she continue to follow the cancer management and screening guidelines provided by her primary healthcare provider.   An individual's cancer risk and medical management are not determined by genetic test results alone. Overall cancer risk assessment incorporates additional factors, including personal medical history, family history, and any available genetic information that may result in a personalized plan for cancer prevention and surveillance.  Breast Cancer Risk: Based on Ms. Rosselli's personal and family history, as well as her genetic test results, the [place] was used to estimate her risk of developing breast  cancer. Tyrer-Cuzick estimates her lifetime risk of developing breast cancer to be approximately 9.7%. This lifetime breast cancer risk is a preliminary estimate based on available information using one of several models endorsed by the [place] (ACS). The ACS recommends consideration of breast MRI screening as an adjunct to mammography for patients at high risk (defined as 20% or greater lifetime risk). A more detailed breast cancer risk assessment can be considered, if clinically indicated. This risk estimate can change over time and that this calculation may be repeated to reflect new information in her personal or family history in the future.     RECOMMENDATIONS FOR FAMILY MEMBERS:  Individuals in this family might be at some increased risk of developing cancer, over the general population risk, simply due to the family history of cancer.  We recommended women in this family have a yearly mammogram beginning at age 63, or 48 years younger than the earliest onset of cancer, an annual clinical breast exam, and perform monthly breast self-exams. Women in this family should also have a gynecological exam as recommended by their primary provider. All family members  should be referred for colonoscopy starting at age 38.  It is also possible there is a hereditary cause for the cancer in Ms. Hurston's family that she did not inherit and therefore was not identified in her.  Based on Ms. Shaver's family history, we recommended her siblings and her niece, who was diagnosed with breast cancer at age 16, have genetic counseling and testing. Ms. Huard will let us know if we can be of any assistance in coordinating genetic counseling and/or testing for these family members.   FOLLOW-UP: Lastly, we discussed with Ms. Wellen that cancer genetics is a rapidly advancing field and it is possible that new genetic tests will be appropriate for her and/or her family members in the future. We encouraged her to remain in contact with cancer genetics on an annual basis so we can update her personal and family histories and let her know of advances in cancer genetics that may benefit this family.   Our contact number was provided. Ms. Kosiba questions were answered to her satisfaction, and she knows she is welcome to call us at anytime with additional questions or concerns.   Clint Guy, MS, [facility] Genetic Counselor [email or web address] Phone: [phone]

## 2021-03-03 NOTE — Telephone Encounter (Signed)
Revealed negative genetic testing. Discussed that we do not know why there is cancer in the family. There could be a genetic mutation in the family that Kathleen Erickson did not inherit. There could also be a mutation in a different gene that we are not testing, or our current technology may not be able to detect certain mutations. It will therefore be important for her to stay in contact with genetics to keep up with whether additional testing may be appropriate in the future.

## 2021-03-04 ENCOUNTER — Encounter (HOSPITAL_COMMUNITY): Payer: Self-pay | Admitting: Hematology and Oncology

## 2021-03-04 ENCOUNTER — Encounter: Payer: Self-pay | Admitting: Genetic Counselor

## 2021-03-29 NOTE — Patient Instructions (Signed)
Kathleen Erickson  [birth year]     @PREFPERIOPPHARMACY @   Your procedure is scheduled on  04/04/2021.   Report to [facility] at  [phone]  A.M.   Call this number if you have problems the morning of surgery:  [phone]   Remember:  Follow the diet and prep instructions given to you by the office.    Take these medicines the morning of surgery with A SIP OF WATER      pristiq, levothyroxine, cytomel, prilosec.  Use your inhaler before you come and bring your rescue inhaler with you.      Do not wear jewelry, make-up or nail polish.  Do not wear lotions, powders, or perfumes, or deodorant.  Do not shave 48 hours prior to surgery.  Men may shave face and neck.  Do not bring valuables to the hospital.  [facility] is not responsible for any belongings or valuables.  Contacts, dentures or bridgework may not be worn into surgery.  Leave your suitcase in the car.  After surgery it may be brought to your room.  For patients admitted to the hospital, discharge time will be determined by your treatment team.  Patients discharged the day of surgery will not be allowed to drive home and must have someone with them for 24 hours.    Special instructions:    DO NOT smoke tobacco or vape for 24 hours before your procedure.  Please read over the following fact sheets that you were given. Anesthesia Post-op Instructions and Care and Recovery After Surgery      Upper Endoscopy, Adult, Care After This sheet gives you information about how to care for yourself after your procedure. Your health care provider may also give you more specific instructions. If you have problems or questions, contact your health careprovider. What can I expect after the procedure? After the procedure, it is common to have: A sore throat. Mild stomach pain or discomfort. Bloating. Nausea. Follow these instructions at home:  Follow instructions from your health care provider about what to eat or drink  after your procedure. Return to your normal activities as told by your health care provider. Ask your health care provider what activities are safe for you. Take over-the-counter and prescription medicines only as told by your health care provider. If you were given a sedative during the procedure, it can affect you for several hours. Do not drive or operate machinery until your health care provider says that it is safe. Keep all follow-up visits as told by your health care provider. This is important. Contact a health care provider if you have: A sore throat that lasts longer than one day. Trouble swallowing. Get help right away if: You vomit blood or your vomit looks like coffee grounds. You have: A fever. Bloody, black, or tarry stools. A severe sore throat or you cannot swallow. Difficulty breathing. Severe pain in your chest or abdomen. Summary After the procedure, it is common to have a sore throat, mild stomach discomfort, bloating, and nausea. If you were given a sedative during the procedure, it can affect you for several hours. Do not drive or operate machinery until your health care provider says that it is safe. Follow instructions from your health care provider about what to eat or drink after your procedure. Return to your normal activities as told by your health care provider. This information is not intended to replace advice given to you by your health  care provider. Make sure you discuss any questions you have with your healthcare provider. Document Revised: 09/09/2019 Document Reviewed: 02/11/2018 Elsevier Patient Education  2022 [place]. Colonoscopy, Adult, Care After This sheet gives you information about how to care for yourself after your procedure. Your health care provider may also give you more specific instructions. If you have problems or questions, contact your health careprovider. What can I expect after the procedure? After the procedure, it is common  to have: A small amount of blood in your stool for 24 hours after the procedure. Some gas. Mild cramping or bloating of your abdomen. Follow these instructions at home: Eating and drinking  Drink enough fluid to keep your urine pale yellow. Follow instructions from your health care provider about eating or drinking restrictions. Resume your normal diet as instructed by your health care provider. Avoid heavy or fried foods that are hard to digest.  Activity Rest as told by your health care provider. Avoid sitting for a long time without moving. Get up to take short walks every 1-2 hours. This is important to improve blood flow and breathing. Ask for help if you feel weak or unsteady. Return to your normal activities as told by your health care provider. Ask your health care provider what activities are safe for you. Managing cramping and bloating  Try walking around when you have cramps or feel bloated. Apply heat to your abdomen as told by your health care provider. Use the heat source that your health care provider recommends, such as a moist heat pack or a heating pad. Place a towel between your skin and the heat source. Leave the heat on for 20-30 minutes. Remove the heat if your skin turns bright red. This is especially important if you are unable to feel pain, heat, or cold. You may have a greater risk of getting burned.  General instructions If you were given a sedative during the procedure, it can affect you for several hours. Do not drive or operate machinery until your health care provider says that it is safe. For the first 24 hours after the procedure: Do not sign important documents. Do not drink alcohol. Do your regular daily activities at a slower pace than normal. Eat soft foods that are easy to digest. Take over-the-counter and prescription medicines only as told by your health care provider. Keep all follow-up visits as told by your health care provider. This is  important. Contact a health care provider if: You have blood in your stool 2-3 days after the procedure. Get help right away if you have: More than a small spotting of blood in your stool. Large blood clots in your stool. Swelling of your abdomen. Nausea or vomiting. A fever. Increasing pain in your abdomen that is not relieved with medicine. Summary After the procedure, it is common to have a small amount of blood in your stool. You may also have mild cramping and bloating of your abdomen. If you were given a sedative during the procedure, it can affect you for several hours. Do not drive or operate machinery until your health care provider says that it is safe. Get help right away if you have a lot of blood in your stool, nausea or vomiting, a fever, or increased pain in your abdomen. This information is not intended to replace advice given to you by your health care provider. Make sure you discuss any questions you have with your healthcare provider. Document Revised: 09/05/2019 Document Reviewed: 04/07/2019 Elsevier Patient  Education  2022 [place] After This sheet gives you information about how to care for yourself after your procedure. Your health care provider may also give you more specific instructions. If you have problems or questions, contact your health careprovider. What can I expect after the procedure? After the procedure, it is common to have: Tiredness. Forgetfulness about what happened after the procedure. Impaired judgment for important decisions. Nausea or vomiting. Some difficulty with balance. Follow these instructions at home: For the time period you were told by your health care provider:     Rest as needed. Do not participate in activities where you could fall or become injured. Do not drive or use machinery. Do not drink alcohol. Do not take sleeping pills or medicines that cause drowsiness. Do not make important  decisions or sign legal documents. Do not take care of children on your own. Eating and drinking Follow the diet that is recommended by your health care provider. Drink enough fluid to keep your urine pale yellow. If you vomit: Drink water, juice, or soup when you can drink without vomiting. Make sure you have little or no nausea before eating solid foods. General instructions Have a responsible adult stay with you for the time you are told. It is important to have someone help care for you until you are awake and alert. Take over-the-counter and prescription medicines only as told by your health care provider. If you have sleep apnea, surgery and certain medicines can increase your risk for breathing problems. Follow instructions from your health care provider about wearing your sleep device: Anytime you are sleeping, including during daytime naps. While taking prescription pain medicines, sleeping medicines, or medicines that make you drowsy. Avoid smoking. Keep all follow-up visits as told by your health care provider. This is important. Contact a health care provider if: You keep feeling nauseous or you keep vomiting. You feel light-headed. You are still sleepy or having trouble with balance after 24 hours. You develop a rash. You have a fever. You have redness or swelling around the IV site. Get help right away if: You have trouble breathing. You have new-onset confusion at home. Summary For several hours after your procedure, you may feel tired. You may also be forgetful and have poor judgment. Have a responsible adult stay with you for the time you are told. It is important to have someone help care for you until you are awake and alert. Rest as told. Do not drive or operate machinery. Do not drink alcohol or take sleeping pills. Get help right away if you have trouble breathing, or if you suddenly become confused. This information is not intended to replace advice given to you  by your health care provider. Make sure you discuss any questions you have with your healthcare provider. Document Revised: 05/27/2020 Document Reviewed: 08/14/2019 Elsevier Patient Education  2022 [employer].

## 2021-03-30 ENCOUNTER — Encounter (HOSPITAL_COMMUNITY): Payer: Self-pay

## 2021-03-30 ENCOUNTER — Encounter (HOSPITAL_COMMUNITY)
Admission: RE | Admit: 2021-03-30 | Discharge: 2021-03-30 | Disposition: A | Discharge status: Home / Self Care | Payer: Medicaid Other | Source: Ambulatory Visit | Attending: Internal Medicine | Admitting: Internal Medicine

## 2021-03-30 ENCOUNTER — Encounter (HOSPITAL_COMMUNITY): Admission: RE | Admit: 2021-03-30 | Payer: Medicaid Other | Source: Ambulatory Visit

## 2021-03-31 ENCOUNTER — Telehealth: Payer: Self-pay | Admitting: Internal Medicine

## 2021-03-31 NOTE — Telephone Encounter (Signed)
PATIENT CALLED AND SAID SHE NEEDS TO CANCEL HER PROCEDURE

## 2021-03-31 NOTE — Telephone Encounter (Signed)
Called pt, she doesn't want to reschedule procedure at this time. Stated she doesn't know what all will be going on.  Per chart, procedure has already been cancelled.  FYI to Roseanne Kaufman NP.

## 2021-04-04 ENCOUNTER — Encounter (HOSPITAL_COMMUNITY): Admission: RE | Payer: Self-pay | Source: Home / Self Care

## 2021-04-04 ENCOUNTER — Ambulatory Visit (HOSPITAL_COMMUNITY): Admission: RE | Admit: 2021-04-04 | Payer: Medicaid Other | Source: Home / Self Care | Admitting: Internal Medicine

## 2021-04-04 SURGERY — COLONOSCOPY WITH PROPOFOL
Anesthesia: Monitor Anesthesia Care

## 2021-04-08 ENCOUNTER — Ambulatory Visit: Payer: Medicaid Other | Admitting: Cardiology

## 2021-04-08 ENCOUNTER — Encounter: Payer: Self-pay | Admitting: Cardiology

## 2021-04-08 ENCOUNTER — Other Ambulatory Visit: Payer: Self-pay

## 2021-04-08 VITALS — BP 134/80 | HR 70 | Ht 61.0 in | Wt 142.2 lb

## 2021-04-08 DIAGNOSIS — R0789 Other chest pain: Secondary | ICD-10-CM

## 2021-04-08 NOTE — Patient Instructions (Signed)
Medication Instructions:  Your physician recommends that you continue on your current medications as directed. Please refer to the Current Medication list given to you today.  *If you need a refill on your cardiac medications before your next appointment, please call your pharmacy*   Lab Work: None If you have labs (blood work) drawn today and your tests are completely normal, you will receive your results only by: MyChart Message (if you have MyChart) OR A paper copy in the mail If you have any lab test that is abnormal or we need to change your treatment, we will call you to review the results.   Testing/Procedures: None   Follow-Up: At [facility], you and your health needs are our priority.  As part of our continuing mission to provide you with exceptional heart care, we have created designated Provider Care Teams.  These Care Teams include your primary Cardiologist (physician) and Advanced Practice Providers (APPs -  Physician Assistants and Nurse Practitioners) who all work together to provide you with the care you need, when you need it.  We recommend signing up for the patient portal called "MyChart".  Sign up information is provided on this After Visit Summary.  MyChart is used to connect with patients for Virtual Visits (Telemedicine).  Patients are able to view lab/test results, encounter notes, upcoming appointments, etc.  Non-urgent messages can be sent to your provider as well.   To learn more about what you can do with MyChart, go to [email or web address].    Your next appointment:   Follow Up: As Needed  Other Instructions    

## 2021-04-08 NOTE — Addendum Note (Signed)
Addended by: Christella Scheuermann C on: 04/08/2021 02:11 PM   Modules accepted: Orders

## 2021-04-08 NOTE — Progress Notes (Signed)
Clinical Summary Ms. Thornell is a 60 y.o.female seen today for follow up of the following medical probloems.    1. Chest pain CAD risk factors: HL, mother with "heart troubles" in 30s, father MI 48s. Maternal aunt with heart troubles. + tobacco x 40 years.  - seen back in 2015 for chest pain and SOB, benign echo and stress echo  - last year reported episodes of chest pain, was referred for stress test.     /2021 nuclear stress: no ischemia by imaging  No recent chest pains. No SOB or DOE.      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    Morphine And Related Itching           Current Outpatient Medications  Medication Sig Dispense Refill   acetaminophen (TYLENOL) 500 MG tablet Take 500 mg by mouth every 6 (six) hours as needed for moderate pain or headache.     Biotin 10000 MCG TABS Take 10,000 Units by mouth daily.     Cholecalciferol (VITAMIN D-3) 125 MCG (5000 UT) TABS Take 10,000 Units by mouth daily.     desvenlafaxine (PRISTIQ) 50 MG 24 hr  tablet Take 1 tablet (50 mg total) by mouth daily. 90 tablet 1   estradiol (ESTRACE) 2 MG tablet TAKE 1 TABLET BY MOUTH EVERY DAY (Patient taking differently: Take 2 mg by mouth daily.) 90 tablet 1   levothyroxine (SYNTHROID) 50 MCG tablet TAKE 1 TABLET BY MOUTH EVERY DAY (Patient taking differently: Take 50 mcg by mouth daily.) 90 tablet 1   liothyronine (CYTOMEL) 5 MCG tablet Take 2 tablets (10 mcg total) by mouth daily. (Patient taking differently: Take 5 mcg by mouth 2 (two) times daily.) 180 tablet 1   Omega-3 Fatty Acids (FISH OIL) 1000 MG CAPS Take 1,000 mg by mouth daily.     omeprazole (PRILOSEC) 40 MG capsule TAKE 1 CAPSULE BY MOUTH EVERY DAY (Patient taking differently: Take 40 mg by mouth in the morning and at bedtime.) 90 capsule 0   polyethylene glycol-electrolytes (TRILYTE) 420 g solution Take 4,000 mLs by mouth as directed. 4000 mL 0   PROAIR HFA 108 (90 Base) MCG/ACT inhaler TAKE 2 PUFFS BY MOUTH EVERY 6 HOURS AS NEEDED FOR WHEEZE OR SHORTNESS OF BREATH (Patient taking differently: Inhale 2 puffs into the lungs every 6 (six) hours as needed for shortness of breath or wheezing.) 8.5 each  3   TRULANCE 3 MG TABS Take 1 tablet by mouth daily. (Patient taking differently: Take 3 mg by mouth daily.) 90 tablet 1   vitamin B-12 (CYANOCOBALAMIN) 250 MCG tablet Take 250 mcg by mouth daily.     No current facility-administered medications for this visit.     Past Surgical History:  Procedure Laterality Date   carpel tunnel release     bilaterally   CESAREAN SECTION     COLONOSCOPY N/A 06/22/2014   Dr. Rourk:External and internal hemorrhoids-grade 4; otherwise normal rectum total colon and terminal ileum.   COLONOSCOPY WITH PROPOFOL N/A 11/26/2017   external and internal hemorrhoids, prominent anal papilla, normal rectum and colon, normal TI.    COLPOSCOPY     ESOPHAGOGASTRODUODENOSCOPY N/A 06/22/2014   Dr. Gala Romney:The mucosa of the esophagus appeared normal  Single gastric ulcer ranging  between 3-5 mm in size was found   Biopsy with negative H.pylori.    ESOPHAGOGASTRODUODENOSCOPY N/A 10/28/2014   Dr. Gala Romney: patulous EG junction, small hiatal hernia, previous noted gastric ulcer healed   ESOPHAGOGASTRODUODENOSCOPY (EGD) WITH PROPOFOL N/A 11/26/2017   normal esophagus, normal stomach, normal duodenum.    GIVENS CAPSULE STUDY N/A 02/14/2021   Procedure: GIVENS CAPSULE STUDY;  Surgeon: Daneil Dolin, MD;  Location: [facility];  Service: Endoscopy;  Laterality: N/A;  7:30am   VAGINAL HYSTERECTOMY     bleeding     Allergies  Allergen Reactions   Penicillins Hives    Has patient had a PCN reaction causing immediate rash, facial/tongue/throat swelling, SOB or lightheadedness with hypotension: No Has patient had a PCN reaction causing severe rash involving mucus membranes or skin necrosis: Yes Has patient had a PCN reaction that required hospitalization: No Has patient had a PCN reaction occurring within the last 10 years: No If all of the above answers are "NO", then may proceed with Cephalosporin use.    Morphine And Related Itching            Family History  Problem Relation Age of Onset   Uterine cancer Mother    Heart disease Mother    Alcohol abuse Mother    Arthritis Mother    COPD Mother    Depression Mother    Ovarian cancer Mother        dx 26s   Heart disease Father        age 17, family questions autopsy   Alcohol abuse Father    Arthritis Father    Depression Father    Early death Father    Cancer Other        ulcer, aunt   Lung cancer Cousin        dx 90s, then again in her 102s (maternal first cousin)   Throat cancer Cousin    Asthma Son    COPD Sister    Lung cancer Maternal Aunt        dx 60s/70s, smoker, ulcer   Cancer Maternal Uncle        dx 71s, unknown primary (affected eye/head, colon, stomach), chewed tobacco   Gout Paternal Uncle    Seizures Paternal Uncle    Lung cancer Maternal Aunt        dx 14s, non smoker   Cancer  Maternal Aunt        dx 81s, mouth cancer, chewed tobacco   Skin cancer Maternal Uncle    Skin cancer Maternal Uncle    Breast cancer Niece 1   Cancer Niece  unknown type, dx 20s   Lung cancer Cousin        dx late 29s (maternal first cousin)   Skin cancer Cousin        multiple (maternal first cousin)   Colon cancer Neg Hx    Ulcers Neg Hx      Social History Ms. Shiller reports that she quit smoking about 10 months ago. She started smoking about 47 years ago. She has a 10.00 pack-year smoking history. She has never used smokeless tobacco. Ms. Petrucci reports no history of alcohol use.   Review of Systems CONSTITUTIONAL: No weight loss, fever, chills, weakness or fatigue.  HEENT: Eyes: No visual loss, blurred vision, double vision or yellow sclerae.No hearing loss, sneezing, congestion, runny nose or sore throat.  SKIN: No rash or itching.  CARDIOVASCULAR: per hpi RESPIRATORY: No shortness of breath, cough or sputum.  GASTROINTESTINAL: No anorexia, nausea, vomiting or diarrhea. No abdominal pain or blood.  GENITOURINARY: No burning on urination, no polyuria NEUROLOGICAL: No headache, dizziness, syncope, paralysis, ataxia, numbness or tingling in the extremities. No change in bowel or bladder control.  MUSCULOSKELETAL: No muscle, back pain, joint pain or stiffness.  LYMPHATICS: No enlarged nodes. No history of splenectomy.  PSYCHIATRIC: No history of depression or anxiety.  ENDOCRINOLOGIC: No reports of sweating, cold or heat intolerance. No polyuria or polydipsia.  Marland Kitchen   Physical Examination Today's Vitals   04/08/21 1304  BP: 134/80  Pulse: 70  SpO2: 95%  Weight: 142 lb 3.2 oz (64.5 kg)  Height: 5\' 1"  (1.549 m)   Body mass index is 26.87 kg/m.  Gen: resting comfortably, no acute distress HEENT: no scleral icterus, pupils equal round and reactive, no palptable cervical adenopathy,  CV: RRR, no m/r/g no jvd Resp: Clear to auscultation bilaterally GI: abdomen is  soft, non-tender, non-distended, normal bowel sounds, no hepatosplenomegaly MSK: extremities are warm, no edema.  Skin: warm, no rash Neuro:  no focal deficits Psych: appropriate affect   Diagnostic Studies  01/2014 stress echo Study Conclusions   - Stress ECG conclusions: Sinus tachycardia. No diagnostic    ST segment changes with exercise, equivocal ST segment    depression in late recovery leads II, III, aVF in the    absence of chest pain. The stress ECG was negative for    ischemia. [facility] scoring: exercise time of 8.35min; maximum    ST deviation of 0.51mm; no angina; resulting score is 6.    This score predicts a low risk of cardiac events.  - Staged echo: There was no diagnostic evidence for    stress-induced ischemia.    01/2014 echo Study Conclusions   - Left ventricle: The cavity size was normal. Wall thickness    was normal. Systolic function was normal. The estimated    ejection fraction was in the range of 60% to 65%. Wall    motion was normal; there were no regional wall motion    abnormalities. Doppler parameters are consistent with    abnormal left ventricular relaxation (grade 1 diastolic    dysfunction).  - Aortic valve: Mildly calcified annulus.  - Mitral valve: Mildly thickened leaflets . Mild    regurgitation.    02/2020 nuclear stress Horizontal ST segment depression ST segment depression of 1 mm was noted during stress in the II, III and aVF leads. The study is normal. There are no perfusion defects This is a low risk study. The left ventricular ejection fraction is hyperdynamic (>65%).  Assessment and Plan  1. Chest pain - no recent symptoms - stress test last year without evidence of ischemia - continue to monitor at this time, can f/u just as needed - EKG SR, no acute ischemic changes today   F/u as needed      Arnoldo Lenis, M.D.

## 2021-04-18 ENCOUNTER — Other Ambulatory Visit (HOSPITAL_COMMUNITY): Payer: Self-pay | Admitting: Surgery

## 2021-04-18 DIAGNOSIS — D500 Iron deficiency anemia secondary to blood loss (chronic): Secondary | ICD-10-CM

## 2021-04-18 DIAGNOSIS — Z8041 Family history of malignant neoplasm of ovary: Secondary | ICD-10-CM

## 2021-04-20 ENCOUNTER — Inpatient Hospital Stay (HOSPITAL_COMMUNITY): Payer: Medicaid Other | Attending: Hematology

## 2021-04-20 ENCOUNTER — Other Ambulatory Visit: Payer: Self-pay

## 2021-04-20 DIAGNOSIS — Z8041 Family history of malignant neoplasm of ovary: Secondary | ICD-10-CM | POA: Diagnosis not present

## 2021-04-20 DIAGNOSIS — D509 Iron deficiency anemia, unspecified: Secondary | ICD-10-CM | POA: Diagnosis not present

## 2021-04-20 DIAGNOSIS — D500 Iron deficiency anemia secondary to blood loss (chronic): Secondary | ICD-10-CM

## 2021-04-20 LAB — CBC WITH DIFFERENTIAL/PLATELET
Abs Immature Granulocytes: 0.01 10*3/uL (ref 0.00–0.07)
Basophils Absolute: 0 10*3/uL (ref 0.0–0.1)
Basophils Relative: 1 %
Eosinophils Absolute: 0.1 10*3/uL (ref 0.0–0.5)
Eosinophils Relative: 2 %
HCT: 34.2 % — ABNORMAL LOW (ref 36.0–46.0)
Hemoglobin: 10.5 g/dL — ABNORMAL LOW (ref 12.0–15.0)
Immature Granulocytes: 0 %
Lymphocytes Relative: 32 %
Lymphs Abs: 1.3 10*3/uL (ref 0.7–4.0)
MCH: 20.2 pg — ABNORMAL LOW (ref 26.0–34.0)
MCHC: 30.7 g/dL (ref 30.0–36.0)
MCV: 65.9 fL — ABNORMAL LOW (ref 80.0–100.0)
Monocytes Absolute: 0.4 10*3/uL (ref 0.1–1.0)
Monocytes Relative: 9 %
Neutro Abs: 2.3 10*3/uL (ref 1.7–7.7)
Neutrophils Relative %: 56 %
Platelets: 254 10*3/uL (ref 150–400)
RBC: 5.19 MIL/uL — ABNORMAL HIGH (ref 3.87–5.11)
RDW: 16 % — ABNORMAL HIGH (ref 11.5–15.5)
WBC: 4 10*3/uL (ref 4.0–10.5)
nRBC: 0 % (ref 0.0–0.2)

## 2021-04-20 LAB — IRON AND TIBC
Iron: 95 ug/dL (ref 28–170)
Saturation Ratios: 25 % (ref 10.4–31.8)
TIBC: 383 ug/dL (ref 250–450)
UIBC: 288 ug/dL

## 2021-04-20 LAB — FERRITIN: Ferritin: 54 ng/mL (ref 11–307)

## 2021-04-21 NOTE — Progress Notes (Signed)
Virtual Visit via Telephone Note [facility]  I connected with Kathleen Erickson  on [id number]  at 10:04 AM by telephone and verified that I am speaking with the correct person using two identifiers.  Location: Patient: Home Provider: [facility]   I discussed the limitations, risks, security and privacy concerns of performing an evaluation and management service by telephone and the availability of in person appointments. I also discussed with the patient that there may be a patient responsible charge related to this service. The patient expressed understanding and agreed to proceed.   History of Present Illness: Ms. Kathleen Erickson is contacted today for follow-up of her iron deficiency anemia.  She was last evaluated in person by Dr. Chryl Heck on 01/21/2021.  She received IV iron supplementation with IV Venna for x4 doses (200 mg total) from 01/21/2021 through 01/31/2021.  At today's visit, she reports feeling well.  No recent hospitalizations or changes in baseline health status.  She reports that she tolerated her IV iron infusions well, had some improved energy after receiving iron repletion therapy.  She does continue to have some residual fatigue, reports that her energy is about 60%.  She denies any current signs or symptoms of blood loss, no epistaxis, hematemesis, hematochezia, or melena.  She denies any chest pain, dyspnea on exertion, palpitations, or syncope.  She reports that her energy is 60%, and appetite is 100%.  She is eating well to maintain a stable weight at this time.    Observations/Objective: Review of Systems  Constitutional:  Negative for chills, diaphoresis, fever, malaise/fatigue and weight loss.  Respiratory:  Negative for cough and shortness of breath.   Cardiovascular:  Negative for chest pain and palpitations.  Gastrointestinal:  Negative for abdominal pain, blood in stool, melena, nausea and vomiting.  Neurological:  Positive for tingling.  Negative for dizziness and headaches.    PHYSICAL EXAM (per limitations of virtual telephone visit): The patient is alert and oriented x 3, exhibiting adequate mentation, good mood, and ability to speak in full sentences and execute sound judgement.   ASSESSMENT & PLAN: 1.  Iron deficiency anemia with  - Labs from 12/28/2020 showed severe iron deficiency with ferritin 3, iron saturation 14%, and TIBC elevated at 468 - Colonoscopy (11/26/2017): External and internal hemorrhoids with prominent anal papilla - EGD (11/26/2017): Normal esophagus, stomach, duodenum - Iron deficiency may be secondary to blood loss from hemorrhoids - Failed oral iron supplementation due to severe constipation - Received IV iron supplementation with IV Venna for x4 doses (800 mg total) from 01/21/2021 through 01/31/2021. - Patient reports that she has family history of thalassemia - patient was tested for thalassemia many decades ago (age 60), does not remember the results - Most recent labs (04/20/2021): Hgb 10.5 with MCV 69.5, elevated RBC 5.19, TIBC 383, ferritin 54 - PLAN: We will give additional IV iron with Venofer 300 mg x 3 doses and check hemoglobin electrophoresis after repletion of iron (suspected thalassemia).  RTC in 3 months for repeat CBC and iron panel, and to discuss results of hemoglobinopathy panel.  2.  Family history of ovarian cancer - Patient's mother had ovarian cancer - PLAN: Referral placed to genetics counselor last appointment   Follow Up Instructions: - IV Venofer 300 mg x 3 doses - Labs in 3 months (CBC, iron panel, hemoglobin electrophoresis) - RTC 2 weeks after labs    I discussed the assessment and treatment plan with the patient. The patient was  provided an opportunity to ask questions and all were answered. The patient agreed with the plan and demonstrated an understanding of the instructions.   The patient was advised to call back or seek an in-person evaluation if the symptoms worsen  or if the condition fails to improve as anticipated.  I provided 13 minutes of non-face-to-face time during this encounter.   Harriett Rush, PA-C 04/22/2021 10:23 AM

## 2021-04-22 ENCOUNTER — Other Ambulatory Visit: Payer: Self-pay

## 2021-04-22 ENCOUNTER — Inpatient Hospital Stay (HOSPITAL_BASED_OUTPATIENT_CLINIC_OR_DEPARTMENT_OTHER): Payer: Medicaid Other | Admitting: Physician Assistant

## 2021-04-22 DIAGNOSIS — D500 Iron deficiency anemia secondary to blood loss (chronic): Secondary | ICD-10-CM | POA: Diagnosis not present

## 2021-04-22 DIAGNOSIS — D509 Iron deficiency anemia, unspecified: Secondary | ICD-10-CM | POA: Diagnosis not present

## 2021-04-25 ENCOUNTER — Inpatient Hospital Stay (HOSPITAL_COMMUNITY): Payer: Medicaid Other | Attending: Hematology and Oncology

## 2021-04-25 ENCOUNTER — Emergency Department (HOSPITAL_COMMUNITY)
Admission: EM | Admit: 2021-04-25 | Discharge: 2021-04-25 | Disposition: A | Discharge status: Home / Self Care | Payer: Medicaid Other | Attending: Student | Admitting: Student

## 2021-04-25 ENCOUNTER — Other Ambulatory Visit: Payer: Self-pay

## 2021-04-25 ENCOUNTER — Encounter (HOSPITAL_COMMUNITY): Payer: Self-pay | Admitting: Emergency Medicine

## 2021-04-25 VITALS — BP 62/35 | HR 65 | Temp 96.7°F | Resp 20

## 2021-04-25 DIAGNOSIS — M7989 Other specified soft tissue disorders: Secondary | ICD-10-CM | POA: Diagnosis not present

## 2021-04-25 DIAGNOSIS — D500 Iron deficiency anemia secondary to blood loss (chronic): Secondary | ICD-10-CM | POA: Insufficient documentation

## 2021-04-25 DIAGNOSIS — T8051XA Anaphylactic reaction due to administration of blood and blood products, initial encounter: Secondary | ICD-10-CM | POA: Insufficient documentation

## 2021-04-25 DIAGNOSIS — E039 Hypothyroidism, unspecified: Secondary | ICD-10-CM | POA: Insufficient documentation

## 2021-04-25 DIAGNOSIS — Z79899 Other long term (current) drug therapy: Secondary | ICD-10-CM | POA: Insufficient documentation

## 2021-04-25 DIAGNOSIS — T7840XA Allergy, unspecified, initial encounter: Secondary | ICD-10-CM

## 2021-04-25 DIAGNOSIS — Z87891 Personal history of nicotine dependence: Secondary | ICD-10-CM | POA: Insufficient documentation

## 2021-04-25 DIAGNOSIS — J449 Chronic obstructive pulmonary disease, unspecified: Secondary | ICD-10-CM | POA: Diagnosis not present

## 2021-04-25 DIAGNOSIS — R410 Disorientation, unspecified: Secondary | ICD-10-CM | POA: Insufficient documentation

## 2021-04-25 DIAGNOSIS — R21 Rash and other nonspecific skin eruption: Secondary | ICD-10-CM | POA: Diagnosis not present

## 2021-04-25 DIAGNOSIS — R001 Bradycardia, unspecified: Secondary | ICD-10-CM | POA: Insufficient documentation

## 2021-04-25 DIAGNOSIS — K649 Unspecified hemorrhoids: Secondary | ICD-10-CM | POA: Insufficient documentation

## 2021-04-25 DIAGNOSIS — M79641 Pain in right hand: Secondary | ICD-10-CM

## 2021-04-25 DIAGNOSIS — Z8541 Personal history of malignant neoplasm of cervix uteri: Secondary | ICD-10-CM | POA: Diagnosis not present

## 2021-04-25 MED ORDER — METHYLPREDNISOLONE SODIUM SUCC 125 MG IJ SOLR
125.0000 mg | Freq: Once | INTRAMUSCULAR | Status: AC | PRN
  Administered 2021-04-25: 125 mg via INTRAVENOUS

## 2021-04-25 MED ORDER — LACTATED RINGERS IV BOLUS
1000.0000 mL | Freq: Once | INTRAVENOUS | Status: AC
  Administered 2021-04-25: 1000 mL via INTRAVENOUS

## 2021-04-25 MED ORDER — DIPHENHYDRAMINE HCL 50 MG/ML IJ SOLN
50.0000 mg | Freq: Once | INTRAMUSCULAR | Status: AC | PRN
  Administered 2021-04-25: 25 mg via INTRAVENOUS

## 2021-04-25 MED ORDER — LORATADINE 10 MG PO TABS
10.0000 mg | ORAL_TABLET | Freq: Once | ORAL | Status: AC
  Administered 2021-04-25: 10 mg via ORAL

## 2021-04-25 MED ORDER — SODIUM CHLORIDE 0.9 % IV SOLN
300.0000 mg | Freq: Once | INTRAVENOUS | Status: AC
  Administered 2021-04-25: 300 mg via INTRAVENOUS
  Filled 2021-04-25: qty 300

## 2021-04-25 MED ORDER — SODIUM CHLORIDE 0.9 % IV SOLN: Freq: Once | INTRAVENOUS | Status: AC

## 2021-04-25 MED ORDER — SODIUM CHLORIDE 0.9 % IV SOLN: Freq: Once | INTRAVENOUS | Status: DC | PRN

## 2021-04-25 MED ORDER — ACETAMINOPHEN 325 MG PO TABS
650.0000 mg | ORAL_TABLET | Freq: Once | ORAL | Status: AC
  Administered 2021-04-25: 650 mg via ORAL

## 2021-04-25 MED ORDER — ACETAMINOPHEN 325 MG PO TABS
ORAL_TABLET | ORAL | Status: AC
  Filled 2021-04-25: qty 2

## 2021-04-25 MED ORDER — LORATADINE 10 MG PO TABS
ORAL_TABLET | ORAL | Status: AC
  Filled 2021-04-25: qty 1

## 2021-04-25 NOTE — ED Provider Notes (Addendum)
[facility] EMERGENCY DEPARTMENT Provider Note   CSN: [id number] Arrival date & time: 04/25/21  1246     History Chief Complaint  Patient presents with   Allergic Reaction    Venofer 300 mg received today and had reaction after getting to car.  Pt returned to [facility], skin mottled and hands swelling.  Denies SOB.  Pt was given pre-med prior to medication.  Never had reaction prior.  Given '125mg'$  of solumedrol IV and '25mg'$  IV benadryl.  Send to ED for chest burning and SBP 60's and DBP 30-40's.  Send to ED by Dr Delton Coombes.      Kathleen Erickson is a 60 y.o. female.  With PMH ocular melanoma, HLD, iron deficiency anemia on iron infusions who presents to the emergency department for suspected allergic reaction.  The patient was seen at her infusion clinic this morning and received her dose of Venofer.  Left the infusion clinic and upon getting into her car started to have a suspected allergic reaction with dyspnea, swelling of the hands and lower extremities, significant pruritus.  She then returned to the infusion clinic where she was given 125 mg of Solu-Medrol and 25 mg of Benadryl.  She was found to have blood pressures 60 over 40s and transferred to the emergency department.  She was not given epinephrine prior to arrival and on arrival has a blood pressure of 135/72, mild bradycardia at 59.  She is somnolent on presentation likely secondary to Benadryl administration, and complains of bilateral hand pain but has no wheezing, urticaria, abdominal pain or other signs of anaphylaxis at this time.   Allergic Reaction Presenting symptoms: rash       Past Medical History:  Diagnosis Date   Allergy    Anemia    thalcemia   Anxiety    Arthritis    Cancer ([place])    Cervical cancer   COPD (chronic obstructive pulmonary disease) ([place])    Depression    Dyspnea    Family history of breast cancer    Family history of lung cancer    Family history of ovarian cancer    Family history of  skin cancer    GERD (gastroesophageal reflux disease)    Hyperlipidemia    Hypothyroidism    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[place])    patient report    Patient Active Problem List   Diagnosis Date Noted   Genetic testing 03/02/2021   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[place])    Family history of ovarian cancer    Family history of breast cancer    Family history of lung cancer    Family history of skin cancer    IDA (iron deficiency anemia) 01/21/2021   Constipation 10/16/2017   Thalassemia 07/17/2017   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 Rourk:External and internal hemorrhoids-grade 4; otherwise normal rectum total colon and terminal ileum.   COLONOSCOPY WITH PROPOFOL N/A 11/26/2017   external and internal hemorrhoids, prominent anal papilla, normal rectum and colon,  normal TI.    COLPOSCOPY     ESOPHAGOGASTRODUODENOSCOPY N/A 06/22/2014   Dr. Gala Romney:The mucosa of the esophagus appeared normal  Single gastric ulcer ranging between 3-5 mm in size was found   Biopsy with negative H.pylori.    ESOPHAGOGASTRODUODENOSCOPY N/A 10/28/2014   Dr. Gala Romney: patulous EG junction, small hiatal hernia, previous noted gastric ulcer healed   ESOPHAGOGASTRODUODENOSCOPY (EGD) WITH PROPOFOL N/A 11/26/2017   normal esophagus, normal stomach, normal duodenum.    GIVENS CAPSULE STUDY N/A 02/14/2021   Procedure: GIVENS CAPSULE STUDY;  Surgeon: Daneil Dolin, MD;  Location: [facility];  Service: Endoscopy;   Laterality: N/A;  7:30am   VAGINAL HYSTERECTOMY     bleeding     OB History     Gravida      Para      Term      Preterm      AB      Living  2      SAB      IAB      Ectopic      Multiple      Live Births              Family History  Problem Relation Age of Onset   Uterine cancer Mother    Heart disease Mother    Alcohol abuse Mother    Arthritis Mother    COPD Mother    Depression Mother    Ovarian cancer Mother        dx 72s   Heart disease Father        age 11, family questions autopsy   Alcohol abuse Father    Arthritis Father    Depression Father    Early death Father    Cancer Other        ulcer, aunt   Lung cancer Cousin        dx 85s, then again in her 93s (maternal first cousin)   Throat cancer Cousin    Asthma Son    COPD Sister    Lung cancer Maternal Aunt        dx 60s/70s, smoker, ulcer   Cancer Maternal Uncle        dx 51s, unknown primary (affected eye/head, colon, stomach), chewed tobacco   Gout Paternal Uncle    Seizures Paternal Uncle    Lung cancer Maternal Aunt        dx 75s, non smoker   Cancer Maternal Aunt        dx 54s, mouth cancer, chewed tobacco   Skin cancer Maternal Uncle    Skin cancer Maternal Uncle    Breast cancer Niece 37   Cancer Niece        unknown type, dx 45s   Lung cancer Cousin        dx late 69s (maternal first cousin)   Skin cancer Cousin        multiple (maternal first cousin)   Colon cancer Neg Hx    Ulcers Neg Hx     Social History   Tobacco Use   Smoking status: Former    Packs/day: 0.25    Years: 40.00    Pack years: 10.00    Types: Cigarettes    Start date: 09/25/1973    Quit date: 05/17/2020    Years since quitting: 0.9   Smokeless tobacco: Never  Vaping Use   Vaping Use: Never used  Substance Use Topics   Alcohol use: No  Drug use: No    Home Medications Prior to Admission medications   Medication Sig Start Date End Date Taking? Authorizing Provider  acetaminophen  (TYLENOL) 500 MG tablet Take 500 mg by mouth every 6 (six) hours as needed for moderate pain or headache. Patient not taking: Reported on 04/22/2021    [provider]  Biotin 10000 MCG TABS Take 10,000 Units by mouth daily.    [provider]  Cholecalciferol (VITAMIN D-3) 125 MCG (5000 UT) TABS Take 10,000 Units by mouth daily.    [provider]  desvenlafaxine (PRISTIQ) 50 MG 24 hr tablet Take 1 tablet (50 mg total) by mouth daily. 02/08/21   Doree Albee, MD  estradiol (ESTRACE) 2 MG tablet TAKE 1 TABLET BY MOUTH EVERY DAY Patient taking differently: Take 2 mg by mouth daily. 02/20/21 05/21/21  Doree Albee, MD  levothyroxine (SYNTHROID) 50 MCG tablet TAKE 1 TABLET BY MOUTH EVERY DAY Patient taking differently: Take 50 mcg by mouth daily. 02/20/21   Doree Albee, MD  liothyronine (CYTOMEL) 5 MCG tablet Take 2 tablets (10 mcg total) by mouth daily. Patient taking differently: Take 5 mcg by mouth daily. 11/18/20   Gosrani, Doristine Johns, MD  Omega-3 Fatty Acids (FISH OIL) 1000 MG CAPS Take 1,000 mg by mouth daily.    [provider]  omeprazole (PRILOSEC) 40 MG capsule TAKE 1 CAPSULE BY MOUTH EVERY DAY Patient taking differently: Take 40 mg by mouth in the morning and at bedtime. 12/01/20   Annitta Needs, NP  polyethylene glycol-electrolytes (TRILYTE) 420 g solution Take 4,000 mLs by mouth as directed. 02/24/21   Daneil Dolin, MD  PROAIR HFA 108 [phone] Base) MCG/ACT inhaler TAKE 2 PUFFS BY MOUTH EVERY 6 HOURS AS NEEDED FOR WHEEZE OR SHORTNESS OF BREATH Patient not taking: Reported on 04/22/2021 11/30/20   Doree Albee, MD  TRULANCE 3 MG TABS Take 1 tablet by mouth daily. Patient taking differently: Take 3 mg by mouth daily. 07/06/20   Doree Albee, MD  vitamin B-12 (CYANOCOBALAMIN) 250 MCG tablet Take 250 mcg by mouth daily.    [provider]    Allergies    Penicillins and Morphine and related  Review of Systems   Review of Systems   Constitutional:  Negative for chills and fever.  HENT:  Negative for ear pain and sore throat.   Eyes:  Negative for pain and visual disturbance.  Respiratory:  Positive for chest tightness and shortness of breath. Negative for cough.   Cardiovascular:  Positive for chest pain. Negative for palpitations.  Gastrointestinal:  Negative for abdominal pain and vomiting.  Genitourinary:  Negative for dysuria and hematuria.  Musculoskeletal:  Negative for arthralgias and back pain.  Skin:  Positive for rash. Negative for color change.  Neurological:  Negative for seizures and syncope.  All other systems reviewed and are negative.  Physical Exam Updated Vital Signs BP 135/72   Pulse (!) 59   Resp (!) 8   Ht '5\' 1"'$  (1.549 m)   Wt 64.5 kg   SpO2 100%   BMI 26.87 kg/m   Physical Exam Vitals and nursing note reviewed.  Constitutional:      General: She is not in acute distress.    Appearance: She is well-developed.  HENT:     Head: Normocephalic and atraumatic.  Eyes:     Conjunctiva/sclera: Conjunctivae normal.  Cardiovascular:     Rate and Rhythm: Normal rate and regular rhythm.  Heart sounds: No murmur heard. Pulmonary:     Effort: Pulmonary effort is normal. No respiratory distress.     Breath sounds: Normal breath sounds.  Abdominal:     Palpations: Abdomen is soft.     Tenderness: There is no abdominal tenderness.  Musculoskeletal:        General: Swelling (Mild swelling of the fingers bilaterally) present.     Cervical back: Neck supple.  Skin:    General: Skin is warm and dry.  Neurological:     Mental Status: She is alert. She is disoriented.    ED Results / Procedures / Treatments   Labs (all labs ordered are listed, but only abnormal results are displayed) Labs Reviewed - No data to display  EKG None  Radiology No results found.  Procedures Procedures   Medications Ordered in ED Medications  lactated ringers bolus 1,000 mL (has no administration in  time range)    ED Course  I have reviewed the triage vital signs and the nursing notes.  Pertinent labs & imaging results that were available during my care of the patient were reviewed by me and considered in my medical decision making (see chart for details).    MDM Rules/Calculators/A&P                           Patient seen in the emergency department for evaluation of suspected allergic reaction after receiving an iron infusion.  On initial presentation, she has no external evidence or physical exam evidence of anaphylaxis and her vital signs have stabilized.  She does have mild swelling to the fingers bilaterally.  She is initially slightly disoriented likely secondary to medication side effect from the IV Benadryl, and she was observed for 2 hours and given 1 L lactated Ringer's.  Over this time, the patient's mental status improved significantly and is able to ambulate without difficulty and tolerated p.o. here in the emergency department.  Lab work deferred to department as the patient had a dramatic improvement in presentation with observation alone.  Her hand pain may be secondary to retained fluid from her allergic reaction or neuropathy, time does not warrant admission and she was encouraged to follow with her oncology team to discuss an alternative for her iron infusions as she may have had an anaphylactic reaction today.  Patient was then discharged with oncology follow-up.  Final Clinical Impression(s) / ED Diagnoses Final diagnoses:  None    Rx / DC Orders ED Discharge Orders     None        Reeda Soohoo, MD 04/25/21 [place]    Teressa Lower, MD 04/25/21 1457

## 2021-04-25 NOTE — ED Notes (Signed)
Pt not able to sign MSE at this time.

## 2021-04-25 NOTE — Progress Notes (Signed)
Patient presents today for Venofer infusion per providers order.  Vital signs within parameters for treatment.  Patient has no new complaints at this time.  Peripheral IV started and blood return noted pre and post infusion.  Venofer infusion given today per MD orders.  Stable during infusion without adverse affects.  Vital signs stable.  No complaints at this time.  Discharge from clinic ambulatory in stable condition.  Alert and oriented X 3.  Follow up with [facility] as scheduled.

## 2021-04-25 NOTE — Discharge Instructions (Addendum)
You were seen in the emergency department for evaluation of a suspected allergic reaction.  While here in the emergency department, you were observed for a period of approximately 2 hours and the symptoms that you had earlier today improved dramatically.  I suspect that your presentation may be due to an allergic reaction from your infusion today and it is important that you call your oncology team to discuss potential alternatives for your iron deficiency anemia.  Please return to the emergency department immediately if you have new or worsening chest pain, shortness of breath, fevers, vomiting or any other concerning symptoms.

## 2021-04-25 NOTE — Patient Instructions (Signed)
[facility]  Discharge Instructions: Thank you for choosing [facility] to provide your oncology and hematology care.  If you have a lab appointment with the [facility], please come in thru the Main Entrance and check in at the main information desk.  Wear comfortable clothing and clothing appropriate for easy access to any Portacath or PICC line.   We strive to give you quality time with your provider. You may need to reschedule your appointment if you arrive late (15 or more minutes).  Arriving late affects you and other patients whose appointments are after yours.  Also, if you miss three or more appointments without notifying the office, you may be dismissed from the clinic at the provider's discretion.      For prescription refill requests, have your pharmacy contact our office and allow 72 hours for refills to be completed.    Today you received the following chemotherapy and/or immunotherapy agents Venofer infusion      To help prevent nausea and vomiting after your treatment, we encourage you to take your nausea medication as directed.  BELOW ARE SYMPTOMS THAT SHOULD BE REPORTED IMMEDIATELY: *FEVER GREATER THAN 100.4 F (38 C) OR HIGHER *CHILLS OR SWEATING *NAUSEA AND VOMITING THAT IS NOT CONTROLLED WITH YOUR NAUSEA MEDICATION *UNUSUAL SHORTNESS OF BREATH *UNUSUAL BRUISING OR BLEEDING *URINARY PROBLEMS (pain or burning when urinating, or frequent urination) *BOWEL PROBLEMS (unusual diarrhea, constipation, pain near the anus) TENDERNESS IN MOUTH AND THROAT WITH OR WITHOUT PRESENCE OF ULCERS (sore throat, sores in mouth, or a toothache) UNUSUAL RASH, SWELLING OR PAIN  UNUSUAL VAGINAL DISCHARGE OR ITCHING   Items with * indicate a potential emergency and should be followed up as soon as possible or go to the Emergency Department if any problems should occur.  Please show the CHEMOTHERAPY ALERT CARD or IMMUNOTHERAPY ALERT CARD at check-in to the Emergency  Department and triage nurse.  Should you have questions after your visit or need to cancel or reschedule your appointment, please contact [facility] [phone]  and follow the prompts.  Office hours are 8:00 a.m. to 4:30 p.m. Monday - Friday. Please note that voicemails left after 4:00 p.m. may not be returned until the following business day.  We are closed weekends and major holidays. You have access to a nurse at all times for urgent questions. Please call the main number to the clinic [phone] and follow the prompts.  For any non-urgent questions, you may also contact your provider using MyChart. We now offer e-Visits for anyone 41 and older to request care online for non-urgent symptoms. For details visit [email or web address].   Also download the MyChart app! Go to the app store, search "MyChart", open the app, select , and log in with your MyChart username and password.  Due to Covid, a mask is required upon entering the hospital/clinic. If you do not have a mask, one will be given to you upon arrival. For doctor visits, patients may have 1 support person aged 49 or older with them. For treatment visits, patients cannot have anyone with them due to current Covid guidelines and our immunocompromised population.

## 2021-04-25 NOTE — Progress Notes (Signed)
12:15 Patient returns to the [facility] and walks back to the nurses desk and requests to speak to a nurse. Patient states, " I feel like I am swelling in my hands and they are tight." Patient placed in treatment chair by B[place] Rn. Vital signs obtained. Patient denies any shortness of breath, itching, pain, or trouble swallowing.   Upon assessment patient's lower extremities mottled. Patient states, " Something is not right."  12:20 IV started by [facility] RN. 22 gauge in the left AC. 500 ml bolus of NS initiated. VS stable. See Flowsheet.   12:22 pm Dr. Delton Coombes at the bedside. Verbal order received to give 12.5 mg of Benadryl IV x 1 dose and '125mg'$  of Solumedrol x 1 dose.   12:25 pm 25 mg IV Benadryl given.  12:26 pm 125 mg of Solumedrol IV given.  12:28 pm BP 77/51 12:31 pm 02 applied via nasal canula at 2 L/min.  12:33 pm BP 61/37. Patient diaphoretic. Patient complains of burning in her chest, and nausea.  12:35 pm BP 62/35. Dr. Debe Coder at the bedside. Verbal order received to take the patient to the emergency department.  12:46 pm Patient placed on stretcher and taken down to the ER by Harold Barban RN/ Bpresnell RN

## 2021-04-26 ENCOUNTER — Encounter (HOSPITAL_COMMUNITY): Payer: Self-pay

## 2021-04-26 ENCOUNTER — Telehealth: Payer: Self-pay

## 2021-04-26 ENCOUNTER — Other Ambulatory Visit (HOSPITAL_COMMUNITY): Payer: Self-pay | Admitting: Physician Assistant

## 2021-04-26 NOTE — Progress Notes (Signed)
Call received from patient regarding recent iron infusion reaction. Patient questions what happens next since she has subsequent infusions scheduled. Per Tarri Abernethy, PA, patient will receive Feraheme with premedications on Monday 05/02/21. Patient aware and agreeable to plan.

## 2021-04-26 NOTE — Progress Notes (Signed)
Patient had reaction to IV Venofer on 04/25/2021, with symptoms of peripheral edema and hypotension.  She was noted to have mottling of her lower extremities.  IV Benadryl and IV Solu-Medrol were given, as documented in note by Benjiman Core, RN from 04/25/2021.  Blood pressure dropped to 62/35, and patient was diaphoretic and complaining of burning in her chest and nausea.  She was taken immediately to the emergency department at that time.  Her vital signs stable on presentation to the ED.  She was given IV fluids, monitored, and discharged home in good condition.  Due to reaction to IV Venofer, we will CANCEL all Venofer treatments in the future.  We will proceed with a trial of IV Feraheme, with premedications (IV Solu-Medrol, Pepcid, Benadryl) and extended monitoring after infusion.  The above changes have been discussed with Dr. Delton Coombes, supervising physician, who agrees.

## 2021-04-26 NOTE — Telephone Encounter (Signed)
Transition Care Management Follow-up Telephone Call Date of discharge and from where: 04/25/2021-[facility] How have you been since you were released from the hospital? Patient stated she is feeling a lot better but still has some swelling.  Any questions or concerns? No  Items Reviewed: Did the pt receive and understand the discharge instructions provided? Yes  Medications obtained and verified?  No medications given at discharge Other? No  Any new allergies since your discharge? No  Dietary orders reviewed? N/A Do you have support at home? Yes   Home Care and Equipment/Supplies: Were home health services ordered? not applicable If so, what is the name of the agency? N/A  Has the agency set up a time to come to the patient's home? not applicable Were any new equipment or medical supplies ordered?  No What is the name of the medical supply agency? N/A Were you able to get the supplies/equipment? not applicable Do you have any questions related to the use of the equipment or supplies? No  Functional Questionnaire: (I = Independent and D = Dependent) ADLs: I  Bathing/Dressing- I  Meal Prep- I  Eating- I  Maintaining continence- I  Transferring/Ambulation- I  Managing Meds- I  Follow up appointments reviewed:  PCP Hospital f/u appt confirmed? No   Specialist Hospital f/u appt confirmed? No   Are transportation arrangements needed? No  If their condition worsens, is the pt aware to call PCP or go to the Emergency Dept.? Yes Was the patient provided with contact information for the PCP's office or ED? Yes Was to pt encouraged to call back with questions or concerns? Yes

## 2021-04-27 ENCOUNTER — Telehealth (INDEPENDENT_AMBULATORY_CARE_PROVIDER_SITE_OTHER): Payer: Medicaid Other | Admitting: Nurse Practitioner

## 2021-04-27 ENCOUNTER — Other Ambulatory Visit: Payer: Self-pay

## 2021-04-27 ENCOUNTER — Encounter (INDEPENDENT_AMBULATORY_CARE_PROVIDER_SITE_OTHER): Payer: Self-pay | Admitting: Nurse Practitioner

## 2021-04-27 VITALS — BP 135/80 | Ht 61.0 in | Wt 140.0 lb

## 2021-04-27 DIAGNOSIS — Z87892 Personal history of anaphylaxis: Secondary | ICD-10-CM

## 2021-04-27 NOTE — Progress Notes (Signed)
Due to national recommendations of social distancing related to the [place] pandemic, an audio-only tele-health visit was felt to be the most appropriate encounter type for this patient today. I connected with  Kathleen Erickson on [id number] utilizing audio-only technology and verified that I am speaking with the correct person using two identifiers. The patient was located at their home, and I was located at the office of [facility] during the encounter. I discussed the limitations of evaluation and management by telemedicine. The patient expressed understanding and agreed to proceed.    Subjective:  Patient ID: Kathleen Erickson, female    DOB: 10/21/1960  Age: 60 y.o. MRN: [id number]  CC:  Chief Complaint  Patient presents with   Follow-up    Discuss severe reaction to iron infusion      HPI  This patient arrives today for a virtual visit for the above.  She wants to discuss an allergic reaction that she had to iron when it was infused 2 days ago.  She was seen at the cancer center and had a Venna for 300 mg infused for treatment of iron deficiency anemia.  Once this was administered she was leaving the cancer center and started to experience some dyspnea, swelling of her hands and lower extremities, and itching.  She also noticed some color changes to her lower extremities.  She returned to the cancer center and while being evaluated they found that her blood pressure dropped significantly to 60/40s.  She was then sent to the emergency department.  She was given steroids and Benadryl at the cancer center and by the time she was present in the emergency department her blood pressure improved to 135/72.  She is scheduled to have Feraheme with premedication administered this upcoming Monday, but the patient is very concerned about having this administered and wanted to discuss this with me.  She tells me today she still experiencing some residual swelling in her extremities in some  residual color changes but overall she is feeling well other than that.  She denies any signs of bleeding.  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[place])    patient report      Family History  Problem Relation Age of Onset   Uterine cancer Mother    Heart disease Mother    Alcohol abuse Mother    Arthritis Mother    COPD Mother    Depression Mother    Ovarian cancer Mother        dx 83s   Heart disease Father        age 2, family questions autopsy   Alcohol abuse Father    Arthritis Father    Depression Father    Early death Father    Cancer Other        ulcer, aunt   Lung cancer Cousin        dx 7s, then again in her 69s (maternal  first cousin)   Throat cancer Cousin    Asthma Son    COPD Sister    Lung cancer Maternal Aunt        dx 60s/70s, smoker, ulcer   Cancer Maternal Uncle        dx 67s, unknown primary (affected eye/head, colon, stomach), chewed tobacco   Gout Paternal Uncle    Seizures Paternal Uncle    Lung cancer Maternal Aunt        dx 41s, non smoker   Cancer Maternal Aunt        dx 46s, mouth cancer, chewed tobacco   Skin cancer Maternal Uncle    Skin cancer Maternal Uncle    Breast cancer Niece 14   Cancer Niece        unknown type, dx 69s   Lung cancer Cousin        dx late 36s (maternal first cousin)   Skin cancer Cousin        multiple (maternal first cousin)   Colon cancer Neg Hx    Ulcers Neg Hx     Social History   Social History Narrative   Visits sister - stays at home   Social History   Tobacco Use   Smoking status:  Former    Packs/day: 0.25    Years: 40.00    Pack years: 10.00    Types: Cigarettes    Start date: 09/25/1973    Quit date: 05/17/2020    Years since quitting: 0.9   Smokeless tobacco: Never  Substance Use Topics   Alcohol use: No     No outpatient medications have been marked as taking for the 04/27/21 encounter (Video Visit) with Ailene Ards, NP.    ROS:  Review of Systems  Constitutional:  Negative for malaise/fatigue.  Respiratory:  Negative for shortness of breath.   Cardiovascular:  Negative for chest pain.  Gastrointestinal:  Negative for blood in stool and melena.    Objective:   Today's Vitals: BP 135/80   Ht '5\' 1"'$  (1.549 m)   Wt 140 lb (63.5 kg)   BMI 26.45 kg/m  Vitals with BMI 04/27/2021 04/25/2021 04/25/2021  Height '5\' 1"'$  - -  Weight 140 lbs - -  BMI 123456 - -  Systolic [id number]  Diastolic 80 56 74  Pulse - 58 60     Physical Exam Comprehensive physical exam not completed today as office visit was conducted remotely.  Patient sounded well over the phone.  Patient was alert and oriented, and appeared to have appropriate judgment.       Assessment and Plan   1. History of anaphylaxis      Plan: 1.  Per shared decision making she has decided to cancel her Feraheme infusion for this upcoming Monday.  I highly recommended that she call the cancer center Monday morning to discuss next steps regarding her next upcoming infusion.  I did reassure her that the provider at the cancer center did change her to a different form of iron than the one she had the allergic reaction to.  I also told her that there is documentation stating that the patient will be premedicated before the Feraheme infusion to reduce the risk of additional anaphylactic reaction.  The patient tells me she understands.  We did discuss signs and symptoms of severe anemia and/or active bleeding and if these were to occur over the weekend that she needs to proceed to the emergency department.  The  patient  expressed her understanding.  She was also notified of the fact this office will be closing on 05/25/2021 permanently.  She was told that she will need to find another primary care provider.  She expressed her understanding.   Tests ordered No orders of the defined types were placed in this encounter.     No orders of the defined types were placed in this encounter.   No follow-up scheduled due to office closing. Total time spent on the phone with this patient was 21 minutes and 44 seconds.  Ailene Ards, NP

## 2021-04-29 ENCOUNTER — Ambulatory Visit (HOSPITAL_COMMUNITY): Payer: Medicaid Other

## 2021-05-02 ENCOUNTER — Ambulatory Visit (HOSPITAL_COMMUNITY): Payer: Medicaid Other

## 2021-05-05 ENCOUNTER — Other Ambulatory Visit: Payer: Self-pay | Admitting: Gastroenterology

## 2021-05-09 ENCOUNTER — Inpatient Hospital Stay (HOSPITAL_COMMUNITY): Payer: Medicaid Other

## 2021-05-09 ENCOUNTER — Encounter (HOSPITAL_COMMUNITY): Payer: Self-pay

## 2021-05-09 ENCOUNTER — Other Ambulatory Visit: Payer: Self-pay

## 2021-05-09 VITALS — BP 116/72 | HR 76 | Temp 97.1°F | Resp 18

## 2021-05-09 DIAGNOSIS — D500 Iron deficiency anemia secondary to blood loss (chronic): Secondary | ICD-10-CM

## 2021-05-09 MED ORDER — FAMOTIDINE 20 MG IN NS 100 ML IVPB
20.0000 mg | Freq: Once | INTRAVENOUS | Status: AC
  Administered 2021-05-09: 20 mg via INTRAVENOUS
  Filled 2021-05-09: qty 100

## 2021-05-09 MED ORDER — DIPHENHYDRAMINE HCL 50 MG/ML IJ SOLN
25.0000 mg | Freq: Once | INTRAMUSCULAR | Status: AC
  Administered 2021-05-09: 25 mg via INTRAVENOUS
  Filled 2021-05-09: qty 1

## 2021-05-09 MED ORDER — SODIUM CHLORIDE 0.9 % IV SOLN: Freq: Once | INTRAVENOUS | Status: AC

## 2021-05-09 MED ORDER — METHYLPREDNISOLONE SODIUM SUCC 125 MG IJ SOLR
125.0000 mg | Freq: Once | INTRAMUSCULAR | Status: AC
  Administered 2021-05-09: 125 mg via INTRAVENOUS
  Filled 2021-05-09: qty 2

## 2021-05-09 MED ORDER — SODIUM CHLORIDE 0.9 % IV SOLN
510.0000 mg | Freq: Once | INTRAVENOUS | Status: AC
  Administered 2021-05-09: 510 mg via INTRAVENOUS
  Filled 2021-05-09: qty 510

## 2021-05-09 NOTE — Patient Instructions (Signed)
[facility]  Discharge Instructions: Thank you for choosing [facility] to provide your oncology and hematology care.  If you have a lab appointment with the [facility], please come in thru the Main Entrance and check in at the main information desk.  Wear comfortable clothing and clothing appropriate for easy access to any Portacath or PICC line.   We strive to give you quality time with your provider. You may need to reschedule your appointment if you arrive late (15 or more minutes).  Arriving late affects you and other patients whose appointments are after yours.  Also, if you miss three or more appointments without notifying the office, you may be dismissed from the clinic at the provider's discretion.      For prescription refill requests, have your pharmacy contact our office and allow 72 hours for refills to be completed.    Today you received the following chemotherapy and/or immunotherapy agents Feraheme. Benadryl, Pepcid and Solu-medrol were given to you before treatment. Return as scheduled.   To help prevent nausea and vomiting after your treatment, we encourage you to take your nausea medication as directed.  BELOW ARE SYMPTOMS THAT SHOULD BE REPORTED IMMEDIATELY: *FEVER GREATER THAN 100.4 F (38 C) OR HIGHER *CHILLS OR SWEATING *NAUSEA AND VOMITING THAT IS NOT CONTROLLED WITH YOUR NAUSEA MEDICATION *UNUSUAL SHORTNESS OF BREATH *UNUSUAL BRUISING OR BLEEDING *URINARY PROBLEMS (pain or burning when urinating, or frequent urination) *BOWEL PROBLEMS (unusual diarrhea, constipation, pain near the anus) TENDERNESS IN MOUTH AND THROAT WITH OR WITHOUT PRESENCE OF ULCERS (sore throat, sores in mouth, or a toothache) UNUSUAL RASH, SWELLING OR PAIN  UNUSUAL VAGINAL DISCHARGE OR ITCHING   Items with * indicate a potential emergency and should be followed up as soon as possible or go to the Emergency Department if any problems should occur.  Please show the  CHEMOTHERAPY ALERT CARD or IMMUNOTHERAPY ALERT CARD at check-in to the Emergency Department and triage nurse.  Should you have questions after your visit or need to cancel or reschedule your appointment, please contact [facility] [phone]  and follow the prompts.  Office hours are 8:00 a.m. to 4:30 p.m. Monday - Friday. Please note that voicemails left after 4:00 p.m. may not be returned until the following business day.  We are closed weekends and major holidays. You have access to a nurse at all times for urgent questions. Please call the main number to the clinic [phone] and follow the prompts.  For any non-urgent questions, you may also contact your provider using MyChart. We now offer e-Visits for anyone 68 and older to request care online for non-urgent symptoms. For details visit [email or web address].   Also download the MyChart app! Go to the app store, search "MyChart", open the app, select [facility], and log in with your MyChart username and password.  Due to Covid, a mask is required upon entering the hospital/clinic. If you do not have a mask, one will be given to you upon arrival. For doctor visits, patients may have 1 support person aged 36 or older with them. For treatment visits, patients cannot have anyone with them due to current Covid guidelines and our immunocompromised population.

## 2021-05-09 NOTE — Progress Notes (Signed)
Patient arrives today for Feraheme infusion> Patient reports still having swelling in her ankles from last infusion. Tarri Abernethy, PA made aware of swelling, pkay to proceed with treatment. Benadryl, Pepcid, and Solu-medrol administered before treatment.   Patient tolerated iron infusion with no complaints voiced. Peripheral IV site clean and dry with good blood return noted before and after infusion. Band aid applied. VSS with discharge and left in satisfactory condition with no s/s of distress noted.

## 2021-05-11 ENCOUNTER — Telehealth (HOSPITAL_COMMUNITY): Payer: Self-pay | Admitting: Physician Assistant

## 2021-05-11 NOTE — Telephone Encounter (Signed)
Patient called RN today to report that she had a reaction to her IV Feraheme from 05/09/2021.  She apparently did not have any symptoms during the infusion, but after she returned home she noted a rash on her face and increased peripheral edema in her feet.  I attempted to call the patient to discuss this further, but she was unavailable.  I left a voice message, and have requested that one of my nurses call her back tomorrow to discuss further.  We will inform the patient that although some patients do have reactions to one type of iron, it is less common to show signs of hypersensitivity reaction to multiple forms of iron.  Unfortunately, in her case, she has reacted to both Venofer and Feraheme, despite premedication with IV Solu-Medrol, Benadryl, and Pepcid.  Therefore, we will NOT plan on giving the patient any further IV iron infusions in the future.  She is scheduled for repeat labs and office visit in October 2022, and we will discuss next steps at that time.

## 2021-05-19 ENCOUNTER — Telehealth (INDEPENDENT_AMBULATORY_CARE_PROVIDER_SITE_OTHER): Payer: Self-pay | Admitting: Nurse Practitioner

## 2021-05-19 NOTE — Telephone Encounter (Signed)
Opened by mistake.

## 2021-06-06 ENCOUNTER — Ambulatory Visit (INDEPENDENT_AMBULATORY_CARE_PROVIDER_SITE_OTHER): Payer: Medicaid Other | Admitting: Internal Medicine

## 2021-06-13 ENCOUNTER — Other Ambulatory Visit: Payer: Self-pay

## 2021-06-13 ENCOUNTER — Encounter: Payer: Self-pay | Admitting: Nurse Practitioner

## 2021-06-13 ENCOUNTER — Ambulatory Visit: Payer: Medicaid Other | Admitting: Nurse Practitioner

## 2021-06-13 VITALS — BP 138/61 | HR 62 | Temp 98.2°F | Ht 61.0 in | Wt 143.0 lb

## 2021-06-13 DIAGNOSIS — E559 Vitamin D deficiency, unspecified: Secondary | ICD-10-CM | POA: Diagnosis not present

## 2021-06-13 DIAGNOSIS — K219 Gastro-esophageal reflux disease without esophagitis: Secondary | ICD-10-CM | POA: Diagnosis not present

## 2021-06-13 DIAGNOSIS — R399 Unspecified symptoms and signs involving the genitourinary system: Secondary | ICD-10-CM

## 2021-06-13 DIAGNOSIS — E782 Mixed hyperlipidemia: Secondary | ICD-10-CM | POA: Diagnosis not present

## 2021-06-13 DIAGNOSIS — L920 Granuloma annulare: Secondary | ICD-10-CM

## 2021-06-13 DIAGNOSIS — Z7689 Persons encountering health services in other specified circumstances: Secondary | ICD-10-CM | POA: Diagnosis not present

## 2021-06-13 DIAGNOSIS — D500 Iron deficiency anemia secondary to blood loss (chronic): Secondary | ICD-10-CM

## 2021-06-13 DIAGNOSIS — Z0001 Encounter for general adult medical examination with abnormal findings: Secondary | ICD-10-CM | POA: Diagnosis not present

## 2021-06-13 DIAGNOSIS — E039 Hypothyroidism, unspecified: Secondary | ICD-10-CM

## 2021-06-13 DIAGNOSIS — Z780 Asymptomatic menopausal state: Secondary | ICD-10-CM | POA: Diagnosis not present

## 2021-06-13 MED ORDER — CLOBETASOL PROPIONATE 0.05 % EX OINT: 1.0000 "application " | TOPICAL_OINTMENT | Freq: Two times a day (BID) | CUTANEOUS | 0 refills | Status: DC

## 2021-06-13 NOTE — Assessment & Plan Note (Signed)
-  check lipids with the next labs

## 2021-06-13 NOTE — Progress Notes (Signed)
New Patient Office Visit  Subjective:  Patient ID: Kathleen Erickson, female    DOB: September 26, 1960  Age: 60 y.o. MRN: [id number]  CC:  Chief Complaint  Patient presents with   New Patient (Initial Visit)    Here to establish care, complains of chronic rash on both arms that itches. Has seen dermatology who took a sample of the area and it came back normal. Also complains of lower back pain/urine odor ongoing x4 days.     HPI Kathleen Erickson presents for new patient visit. Transferring care from [facility]. Last physical was over a year ago. Last labs were drawn 04/20/21.  She has a right arm rash that has been ongoing for several months. She saw dermatology about 2 years ago and had a biopsy, but results were negative.  She has low back pain, and she thinks it was related to drinking drinks.  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 Rourk:External and internal hemorrhoids-grade 4; otherwise normal rectum total colon and terminal ileum.   COLONOSCOPY WITH PROPOFOL N/A 11/26/2017   external and internal hemorrhoids, prominent anal papilla, normal rectum and colon, normal TI.    COLPOSCOPY     ESOPHAGOGASTRODUODENOSCOPY N/A 06/22/2014   Dr. Gala Romney:The mucosa of the esophagus appeared normal  Single gastric ulcer ranging between 3-5 mm in size was found   Biopsy with negative H.pylori.    ESOPHAGOGASTRODUODENOSCOPY  N/A 10/28/2014   Dr. Gala Romney: patulous EG junction, small hiatal hernia, previous noted gastric ulcer healed   ESOPHAGOGASTRODUODENOSCOPY (EGD) WITH PROPOFOL N/A 11/26/2017   normal esophagus, normal stomach, normal duodenum.    GIVENS CAPSULE STUDY N/A 02/14/2021   Procedure: GIVENS CAPSULE STUDY;  Surgeon: Daneil Dolin, MD;  Location: [facility];  Service: Endoscopy;  Laterality: N/A;  7:30am   VAGINAL HYSTERECTOMY     bleeding; she thinks it was a partial and has ovaries in place    Family History  Problem Relation Age of Onset   Uterine cancer Mother    Heart disease Mother    Alcohol abuse Mother    Arthritis Mother    COPD Mother    Depression Mother    Ovarian cancer Mother        dx 60s   Heart disease Father        age 56, family questions autopsy   Alcohol abuse Father    Arthritis Father    Depression Father    Early death Father    Cancer Other        ulcer, aunt   Lung cancer Cousin        dx 49s, then again in her 36s (maternal first cousin)   Throat cancer Cousin  Asthma Son    COPD Sister    Lung cancer Maternal Aunt        dx 60s/70s, smoker, ulcer   Cancer Maternal Uncle        dx 38s, unknown primary (affected eye/head, colon, stomach), chewed tobacco   Gout Paternal Uncle    Seizures Paternal Uncle    Lung cancer Maternal Aunt        dx 62s, non smoker   Cancer Maternal Aunt        dx 44s, mouth cancer, chewed tobacco   Skin cancer Maternal Uncle    Skin cancer Maternal Uncle    Breast cancer Niece 32   Cancer Niece        unknown type, dx 10s   Lung cancer Cousin        dx late 84s (maternal first cousin)   Skin cancer Cousin        multiple (maternal first cousin)   Colon cancer Neg Hx    Ulcers Neg Hx     Social History   Socioeconomic History   Marital status: Legally Separated    Spouse name: [name]   Number of children: 2   Years of education: 10   Highest education level: Not on file  Occupational History   Occupation:  not working    Comment: cleaned houses    Comment: disabled from arthritis  Tobacco Use   Smoking status: Former    Packs/day: 0.25    Years: 40.00    Pack years: 10.00    Types: Cigarettes    Start date: 09/25/1973    Quit date: 05/17/2020    Years since quitting: 1.0   Smokeless tobacco: Never  Vaping Use   Vaping Use: Never used  Substance and Sexual Activity   Alcohol use: No   Drug use: No   Sexual activity: Not Currently  Other Topics Concern   Not on file  Social History Narrative   Visits sister - stays at home   Social Determinants of Health   Financial Resource Strain: Not on file  Food Insecurity: Not on file  Transportation Needs: Not on file  Physical Activity: Not on file  Stress: Not on file  Social Connections: Not on file  Intimate Partner Violence: Not on file    ROS Review of Systems  Constitutional: Negative.   Respiratory: Negative.    Cardiovascular: Negative.   Musculoskeletal:  Positive for back pain.  Skin:  Positive for rash.  Psychiatric/Behavioral: Negative.     Objective:   Today's Vitals: BP 138/61 (BP Location: Left Arm, Patient Position: Sitting, Cuff Size: Large)   Pulse 62   Temp 98.2 F (36.8 C) (Oral)   Ht _0  (1.549 m)   Wt 143 lb (64.9 kg)   SpO2 97%   BMI 27.02 kg/m   Physical Exam Constitutional:      Appearance: Normal appearance.  Cardiovascular:     Rate and Rhythm: Normal rate and regular rhythm.     Pulses: Normal pulses.     Heart sounds: Normal heart sounds.  Pulmonary:     Effort: Pulmonary effort is normal.     Breath sounds: Normal breath sounds.  Skin:    Findings: Rash present.     Comments: Right upper arm has rash over bicep; erythema at edge of rash with central clearing  Neurological:     Mental Status: She is alert.  Psychiatric:        Mood and Affect: Mood  normal.        Behavior: Behavior normal.        Thought Content: Thought content normal.        Judgment: Judgment normal.     Assessment & Plan:   Problem List Items Addressed This Visit       Digestive   GERD (gastroesophageal reflux disease)    -followed by GI      Relevant Medications   meclizine (ANTIVERT) 25 MG tablet   simethicone (MYLICON) 696 MG chewable tablet   bisacodyl (GENTLE LAXATIVE) 5 MG EC tablet     Endocrine   Hypothyroidism    Lab Results  Component Value Date   TSH 3.63 02/07/2021   T4TOTAL 11.4 07/06/2020  -had documented hypothyroidism -takes levothyroxine (T4) and cytomel (T3) -check thyroid hormones with next set of labs -she will hold biotin 1 week prior to having these labs drawn      Relevant Orders   TSH+T4F+T3Free     Musculoskeletal and Integument   Granuloma annulare    -has established diagnosis -will treat with clobetasol -if no response, will refer to derm again      Relevant Medications   clobetasol ointment (TEMOVATE) 0.05 %     Other   Hyperlipidemia    -check lipids with the next labs      Relevant Orders   CBC with Differential/Platelet   CMP14+EGFR   Lipid Panel With LDL/HDL Ratio   IDA (iron deficiency anemia)    -followed by hematology -allergies to 2 forms of IV iron      Menopause    -has been taking estradiol for symptoms -she is s/p partial hysterectomy      Vitamin D deficiency    -check vit D with next set of labs -she has been taking substantial doses of Vit D      Relevant Orders   VITAMIN D 25 Hydroxy (Vit-D Deficiency, Fractures)   Other Visit Diagnoses     Establishing care with new doctor, encounter for    -  Primary   Encounter for general adult medical examination with abnormal findings       Relevant Orders   CBC with Differential/Platelet   CMP14+EGFR   Lipid Panel With LDL/HDL Ratio   TSH+T4F+T3Free   VITAMIN D 25 Hydroxy (Vit-D Deficiency, Fractures)       Outpatient Encounter Medications as of 06/13/2021  Medication Sig   acetaminophen (TYLENOL) 500 MG tablet Take 500 mg by mouth every 6  (six) hours as needed for moderate pain or headache.   Biotin 10000 MCG TABS Take 10,000 Units by mouth daily.   bisacodyl (GENTLE LAXATIVE) 5 MG EC tablet Take 5 mg by mouth daily as needed for moderate constipation.   Cholecalciferol (VITAMIN D-3) 125 MCG (5000 UT) TABS Take 10,000 Units by mouth daily.   clobetasol ointment (TEMOVATE) 7.89 % Apply 1 application topically 2 (two) times daily.   desvenlafaxine (PRISTIQ) 50 MG 24 hr tablet Take 1 tablet (50 mg total) by mouth daily.   estradiol (ESTRACE) 2 MG tablet TAKE 1 TABLET BY MOUTH EVERY DAY (Patient taking differently: Take 2 mg by mouth daily.)   hydrocortisone (PROCTOSOL HC) 2.5 % rectal cream Place 1 application rectally 2 (two) times daily.   levothyroxine (SYNTHROID) 50 MCG tablet TAKE 1 TABLET BY MOUTH EVERY DAY (Patient taking differently: Take 50 mcg by mouth daily.)   liothyronine (CYTOMEL) 5 MCG tablet Take 2 tablets (10 mcg total) by mouth daily. (Patient taking differently:  Take 5 mcg by mouth daily.)   meclizine (ANTIVERT) 25 MG tablet Take 25 mg by mouth 3 (three) times daily as needed for dizziness.   mometasone (ELOCON) 0.1 % cream Apply 1 application topically daily as needed.   omeprazole (PRILOSEC) 40 MG capsule TAKE 1 CAPSULE BY MOUTH ONCE OR TWICE DAILY BEFORE MEALS.   prednisoLONE acetate (PRED FORTE) 1 % ophthalmic suspension 4 drops 2 (two) times daily as needed. Administer 4 drops into each ear twice daily as needed for itching.   PROAIR HFA 108 (90 Base) MCG/ACT inhaler TAKE 2 PUFFS BY MOUTH EVERY 6 HOURS AS NEEDED FOR WHEEZE OR SHORTNESS OF BREATH   simethicone (MYLICON) 574 MG chewable tablet Chew 125 mg by mouth daily.   TRULANCE 3 MG TABS Take 1 tablet by mouth daily. (Patient taking differently: Take 3 mg by mouth daily.)   vitamin B-12 (CYANOCOBALAMIN) 250 MCG tablet Take 250 mcg by mouth daily.   Omega-3 Fatty Acids (FISH OIL) 1000 MG CAPS Take 1,000 mg by mouth daily. (Patient not taking: Reported on  06/13/2021)   [DISCONTINUED] polyethylene glycol-electrolytes (TRILYTE) 420 g solution Take 4,000 mLs by mouth as directed. (Patient not taking: Reported on 06/13/2021)   No facility-administered encounter medications on file as of 06/13/2021.    Follow-up: Return in about 1 month (around 07/13/2021) for Physical Exam.   Noreene Larsson, NP

## 2021-06-13 NOTE — Patient Instructions (Signed)
Please have fasting labs drawn 2-3 days prior to your appointment so we can discuss the results during your office visit.  

## 2021-06-13 NOTE — Assessment & Plan Note (Signed)
-  has established diagnosis -will treat with clobetasol -if no response, will refer to derm again

## 2021-06-13 NOTE — Assessment & Plan Note (Signed)
-  followed by hematology -allergies to 2 forms of IV iron

## 2021-06-13 NOTE — Assessment & Plan Note (Signed)
-  followed by GI

## 2021-06-13 NOTE — Assessment & Plan Note (Signed)
-  check vit D with next set of labs -she has been taking substantial doses of Vit D

## 2021-06-13 NOTE — Assessment & Plan Note (Signed)
-  has been taking estradiol for symptoms -she is s/p partial hysterectomy

## 2021-06-13 NOTE — Assessment & Plan Note (Signed)
Lab Results  Component Value Date   TSH 3.63 02/07/2021   T4TOTAL 11.4 07/06/2020   -had documented hypothyroidism -takes levothyroxine (T4) and cytomel (T3) -check thyroid hormones with next set of labs -she will hold biotin 1 week prior to having these labs drawn

## 2021-06-14 LAB — POCT URINALYSIS DIPSTICK
Bilirubin, UA: NEGATIVE
Blood, UA: NEGATIVE
Glucose, UA: NEGATIVE
Ketones, UA: NEGATIVE
Leukocytes, UA: NEGATIVE
Nitrite, UA: NEGATIVE
Protein, UA: NEGATIVE
Spec Grav, UA: 1.02 (ref 1.010–1.025)
Urobilinogen, UA: 0.2 E.U./dL
pH, UA: 7 (ref 5.0–8.0)

## 2021-06-14 NOTE — Addendum Note (Signed)
Addended by: Laretta Bolster on: 06/14/2021 11:19 AM   Modules accepted: Orders

## 2021-06-29 NOTE — Progress Notes (Signed)
Referring Provider: Doree Albee, MD Primary Care Physician:  Noreene Larsson, NP Primary GI: Dr. Gala Romney  Chief Complaint  Patient presents with   Follow-up    HPI:   Kathleen Erickson is a 60 y.o. female presenting today with a history of chronic GERD, constipation, IDA in 2019 with colonoscopy and EGD overall unrevealing. Worsening IDA in April 2022, with subsequent capsule study revealing gastric ulcers and erythema in colon. Updated EGD/colonoscopy was recommended, but she has had to postpone this. In the interim, she has seen Hematology and received iron infusions. Unfortunately, she experienced an anaphylactic reaction to infusion.  Celiac serologies negative historically.   Takes Ibuprofen intermittently but not every week. Tylenol as well. Burping a lot. Has lots of gas. ust scant hematochezia if straining. Notes hemorrhoids. Wants more anusol. SItting on toilet 30 minutes at a time. Taking Dulcolax. Was on Trulance without much improvement.  Linzess not ideal even with high dosing.   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 Rourk:External and internal hemorrhoids-grade 4; otherwise normal rectum total colon and terminal ileum.   COLONOSCOPY WITH PROPOFOL N/A 11/26/2017   external and internal hemorrhoids, prominent anal papilla, normal rectum and colon, normal TI.    COLPOSCOPY      ESOPHAGOGASTRODUODENOSCOPY N/A 06/22/2014   Dr. Gala Romney:The mucosa of the esophagus appeared normal  Single gastric ulcer ranging between 3-5 mm in size was found   Biopsy with negative H.pylori.    ESOPHAGOGASTRODUODENOSCOPY N/A 10/28/2014   Dr. Gala Romney: patulous EG junction, small hiatal hernia, previous noted gastric ulcer healed   ESOPHAGOGASTRODUODENOSCOPY (EGD) WITH PROPOFOL N/A 11/26/2017   normal esophagus, normal stomach, normal duodenum.    GIVENS CAPSULE STUDY N/A 02/14/2021   Procedure: GIVENS CAPSULE STUDY;  Surgeon: Daneil Dolin, MD;  Location: [facility];  Service: Endoscopy;  Laterality: N/A;  7:30am   VAGINAL HYSTERECTOMY     bleeding; she thinks it was a partial and has ovaries in place    Current Outpatient Medications  Medication Sig Dispense Refill   bisacodyl (GENTLE LAXATIVE) 5 MG EC tablet Take 5 mg by mouth daily as needed for moderate constipation.     Cholecalciferol (VITAMIN D-3) 125 MCG (5000 UT) TABS Take 10,000 Units by mouth daily.     clobetasol ointment (TEMOVATE) 4.54 % Apply 1 application topically 2 (two) times daily. 30 g 0   desvenlafaxine (PRISTIQ) 50 MG 24 hr tablet Take 1 tablet (50 mg total) by mouth daily. 90 tablet 1   hydrocortisone (ANUSOL-HC) 2.5 % rectal cream Place 1 application rectally 2 (two) times daily. 30 g 1   levothyroxine (SYNTHROID) 50 MCG tablet TAKE 1 TABLET BY MOUTH EVERY  DAY (Patient taking differently: Take 50 mcg by mouth daily.) 90 tablet 1   liothyronine (CYTOMEL) 5 MCG tablet Take 2 tablets (10 mcg total) by mouth daily. (Patient taking differently: Take 5 mcg by mouth daily.) 180 tablet 1   lubiprostone (AMITIZA) 24 MCG capsule Take 1 capsule (24 mcg total) by mouth 2 (two) times daily with a meal. 60 capsule 3   mometasone (ELOCON) 0.1 % cream Apply 1 application topically daily as needed.     Omega-3 Fatty Acids (FISH OIL) 1000 MG CAPS Take 1,000 mg by mouth daily.     omeprazole (PRILOSEC) 40 MG capsule TAKE 1 CAPSULE  BY MOUTH ONCE OR TWICE DAILY BEFORE MEALS. 90 capsule 3   PROAIR HFA 108 (90 Base) MCG/ACT inhaler TAKE 2 PUFFS BY MOUTH EVERY 6 HOURS AS NEEDED FOR WHEEZE OR SHORTNESS OF BREATH 8.5 each 3   TRULANCE 3 MG TABS Take 1 tablet by mouth daily. (Patient taking differently: Take 3 mg by mouth daily.) 90 tablet 1   vitamin B-12 (CYANOCOBALAMIN) 250 MCG tablet Take 250 mcg by mouth daily.     estradiol (ESTRACE) 2 MG tablet TAKE 1 TABLET BY MOUTH EVERY DAY (Patient taking differently: Take 2 mg by mouth daily.) 90 tablet 1   No current facility-administered medications for this visit.    Allergies as of 06/30/2021 - Review Complete 06/30/2021  Allergen Reaction Noted   Venofer [iron sucrose] Other (See Comments) 04/26/2021   Feraheme [ferumoxytol] Swelling and Rash 05/11/2021   Penicillins Hives 01/20/2014   Morphine and related Itching 01/20/2014    Family History  Problem Relation Age of Onset   Uterine cancer Mother    Heart disease Mother    Alcohol abuse Mother    Arthritis Mother    COPD Mother    Depression Mother    Ovarian cancer Mother        dx 72s   Heart disease Father        age 37, family questions autopsy   Alcohol abuse Father    Arthritis Father    Depression Father    Early death Father    Cancer Other        ulcer, aunt   Lung cancer Cousin        dx 53s, then again in her 62s (maternal first cousin)   Throat cancer Cousin    Asthma Son    COPD Sister    Lung cancer Maternal Aunt        dx 60s/70s, smoker, ulcer   Cancer Maternal Uncle        dx 38s, unknown primary (affected eye/head, colon, stomach), chewed tobacco   Gout Paternal Uncle    Seizures Paternal Uncle    Lung cancer Maternal Aunt        dx 72s, non smoker   Cancer Maternal Aunt        dx 65s, mouth cancer, chewed tobacco   Skin cancer Maternal Uncle    Skin cancer Maternal Uncle    Breast cancer Niece 61   Cancer Niece        unknown type, dx 63s   Lung cancer Cousin        dx late  29s (maternal first cousin)   Skin cancer Cousin        multiple (maternal first cousin)   Colon cancer Neg Hx    Ulcers Neg Hx     Social History   Socioeconomic History   Marital status: Herbalist  Separated    Spouse name: [name]   Number of children: 2   Years of education: 10   Highest education level: Not on file  Occupational History   Occupation: not working    Comment: cleaned houses    Comment: disabled from arthritis  Tobacco Use   Smoking status: Former    Packs/day: 0.25    Years: 40.00    Pack years: 10.00    Types: Cigarettes    Start date: 09/25/1973    Quit date: 05/17/2020    Years since quitting: 1.1   Smokeless tobacco: Never  Vaping Use   Vaping Use: Never used  Substance and Sexual Activity   Alcohol use: No   Drug use: No   Sexual activity: Not Currently  Other Topics Concern   Not on file  Social History Narrative   Visits sister - stays at home   Social Determinants of Health   Financial Resource Strain: Not on file  Food Insecurity: Not on file  Transportation Needs: Not on file  Physical Activity: Not on file  Stress: Not on file  Social Connections: Not on file    Review of Systems: Gen: Denies fever, chills, anorexia. Denies fatigue, weakness, weight loss.  CV: Denies chest pain, palpitations, syncope, peripheral edema, and claudication. Resp: Denies dyspnea at rest, cough, wheezing, coughing up blood, and pleurisy. GI: see HPI Derm: Denies rash, itching, dry skin Psych: Denies depression, anxiety, memory loss, confusion. No homicidal or suicidal ideation.  Heme: Denies bruising, bleeding, and enlarged lymph nodes.  Physical Exam: BP 120/69   Pulse 60   Temp (!) 97.4 F (36.3 C) (Temporal)   Ht 5\' 1"  (1.549 m)   Wt 143 lb 3.2 oz (65 kg)   BMI 27.06 kg/m  General:   Alert and oriented. No distress noted. Pleasant and cooperative.  Head:  Normocephalic and atraumatic. Eyes:  Conjuctiva clear without scleral icterus. Mouth:  mask in place Abdomen:  +BS, soft, mild TTP and non-distended. No rebound or guarding. No HSM or masses noted. Rectal: deferred Msk:  Symmetrical without gross deformities. Normal posture. Extremities:  Without edema. Neurologic:  Alert and  oriented x4 Psych:  Alert and cooperative. Normal mood and affect.  ASSESSMENT: Kathleen Erickson is a 60 y.o. female presenting today with history of chronic GERD, constipation, IDA in 2019 with colonoscopy and EGD overall unrevealing at that time. Worsening IDA in April 2022, with subsequent capsule study revealing gastric ulcers and erythema in colon. Updated EGD/colonoscopy was recommended, but she has had to postpone this. Presenting today to arrange procedures.   IDA: followed by Hematology. No melena. Notes scant hematochezia in setting of likely hemorrhoids. Pursuing updated colonoscopy/EGD I near future.  Constipation: taking dulcolax currently but inadequate for bowel regimen. Trulance without improvement. Linzess without improvement despite multiple dosing regimens. Will trial Amitiza 24 mcg po BID with food.   GERD: continue omeprazole BID.   PLAN:  Proceed with colonoscopy/EGD by Dr. Gala Romney in near future with Propofol: the risks, benefits, and alternatives have been discussed with the patient in detail. The patient states understanding and desires to proceed.  Amitiza 24 mcg po BID sent to pharmacy  Continue omeprazole BID  Follow-up thereafter   Annitta Needs, PhD, ANP-BC [facility]

## 2021-06-30 ENCOUNTER — Other Ambulatory Visit: Payer: Self-pay

## 2021-06-30 ENCOUNTER — Telehealth: Payer: Self-pay

## 2021-06-30 ENCOUNTER — Encounter: Payer: Self-pay | Admitting: Gastroenterology

## 2021-06-30 ENCOUNTER — Telehealth: Payer: Self-pay | Admitting: Gastroenterology

## 2021-06-30 ENCOUNTER — Ambulatory Visit: Payer: Medicaid Other | Admitting: Gastroenterology

## 2021-06-30 VITALS — BP 120/69 | HR 60 | Temp 97.4°F | Ht 61.0 in | Wt 143.2 lb

## 2021-06-30 DIAGNOSIS — D509 Iron deficiency anemia, unspecified: Secondary | ICD-10-CM

## 2021-06-30 DIAGNOSIS — K219 Gastro-esophageal reflux disease without esophagitis: Secondary | ICD-10-CM | POA: Diagnosis not present

## 2021-06-30 DIAGNOSIS — K5909 Other constipation: Secondary | ICD-10-CM | POA: Diagnosis not present

## 2021-06-30 MED ORDER — LUBIPROSTONE 24 MCG PO CAPS: 24.0000 ug | ORAL_CAPSULE | Freq: Two times a day (BID) | ORAL | 3 refills | Status: DC

## 2021-06-30 MED ORDER — HYDROCORTISONE (PERIANAL) 2.5 % EX CREA: 1.0000 "application " | TOPICAL_CREAM | Freq: Two times a day (BID) | CUTANEOUS | 1 refills | Status: DC

## 2021-06-30 NOTE — Telephone Encounter (Signed)
PA for Amitiza 24 mcg capsules has been approved. Approval letter will be scanned into patient's chart.

## 2021-06-30 NOTE — Telephone Encounter (Signed)
Will schedule TCS/EGD ASA 2 with Dr. Gala Romney when his next schedule is available.  [name], is she conscious or Propofol?

## 2021-06-30 NOTE — Patient Instructions (Signed)
I have sent in a medication called Amitiza to take for constipation instead of Trulance. Take this twice a day with food (while you are eating) to avoid nausea. Let me know if not covered!  We are arranging a colonoscopy and upper endoscopy with Dr. Gala Romney in the near future!   I enjoyed seeing you again today! As you know, I value our relationship and want to provide genuine, compassionate, and quality care. I welcome your feedback. If you receive a survey regarding your visit,  I greatly appreciate you taking time to fill this out. See you next time!  Annitta Needs, PhD, ANP-BC [facility]

## 2021-07-04 NOTE — Telephone Encounter (Signed)
Noted. Will schedule when next schedule is available.

## 2021-07-04 NOTE — Telephone Encounter (Addendum)
Propofol. ASA 2. I apologize for not clarifying!

## 2021-07-06 MED ORDER — CLENPIQ 10-3.5-12 MG-GM -GM/160ML PO SOLN: 1.0000 | Freq: Once | ORAL | 0 refills | Status: AC

## 2021-07-06 NOTE — Telephone Encounter (Signed)
Called pt. She has been scheduled for 11/28 at 8:15am. Aware will mail prep instructions. She asked for a low volume prep to be called in. Clenpiq sent in. Instructions mailed.

## 2021-07-06 NOTE — Addendum Note (Signed)
Addended by: Cheron Every on: 07/06/2021 09:50 AM   Modules accepted: Orders

## 2021-07-11 ENCOUNTER — Inpatient Hospital Stay (HOSPITAL_COMMUNITY): Payer: Medicaid Other | Attending: Hematology

## 2021-07-11 ENCOUNTER — Other Ambulatory Visit: Payer: Self-pay

## 2021-07-11 DIAGNOSIS — E782 Mixed hyperlipidemia: Secondary | ICD-10-CM | POA: Diagnosis not present

## 2021-07-11 DIAGNOSIS — Z8541 Personal history of malignant neoplasm of cervix uteri: Secondary | ICD-10-CM | POA: Diagnosis not present

## 2021-07-11 DIAGNOSIS — R059 Cough, unspecified: Secondary | ICD-10-CM | POA: Diagnosis not present

## 2021-07-11 DIAGNOSIS — D509 Iron deficiency anemia, unspecified: Secondary | ICD-10-CM | POA: Insufficient documentation

## 2021-07-11 DIAGNOSIS — R079 Chest pain, unspecified: Secondary | ICD-10-CM | POA: Insufficient documentation

## 2021-07-11 DIAGNOSIS — K219 Gastro-esophageal reflux disease without esophagitis: Secondary | ICD-10-CM | POA: Diagnosis not present

## 2021-07-11 DIAGNOSIS — Z8719 Personal history of other diseases of the digestive system: Secondary | ICD-10-CM | POA: Diagnosis not present

## 2021-07-11 DIAGNOSIS — D563 Thalassemia minor: Secondary | ICD-10-CM | POA: Diagnosis not present

## 2021-07-11 DIAGNOSIS — Z803 Family history of malignant neoplasm of breast: Secondary | ICD-10-CM | POA: Insufficient documentation

## 2021-07-11 DIAGNOSIS — Z8041 Family history of malignant neoplasm of ovary: Secondary | ICD-10-CM | POA: Insufficient documentation

## 2021-07-11 DIAGNOSIS — Z79899 Other long term (current) drug therapy: Secondary | ICD-10-CM | POA: Insufficient documentation

## 2021-07-11 DIAGNOSIS — M7989 Other specified soft tissue disorders: Secondary | ICD-10-CM | POA: Diagnosis not present

## 2021-07-11 DIAGNOSIS — Z820 Family history of epilepsy and other diseases of the nervous system: Secondary | ICD-10-CM | POA: Insufficient documentation

## 2021-07-11 DIAGNOSIS — Z8049 Family history of malignant neoplasm of other genital organs: Secondary | ICD-10-CM | POA: Insufficient documentation

## 2021-07-11 DIAGNOSIS — K5900 Constipation, unspecified: Secondary | ICD-10-CM | POA: Diagnosis not present

## 2021-07-11 DIAGNOSIS — Z801 Family history of malignant neoplasm of trachea, bronchus and lung: Secondary | ICD-10-CM | POA: Diagnosis not present

## 2021-07-11 DIAGNOSIS — E559 Vitamin D deficiency, unspecified: Secondary | ICD-10-CM | POA: Diagnosis not present

## 2021-07-11 DIAGNOSIS — Z0001 Encounter for general adult medical examination with abnormal findings: Secondary | ICD-10-CM | POA: Diagnosis not present

## 2021-07-11 DIAGNOSIS — R21 Rash and other nonspecific skin eruption: Secondary | ICD-10-CM | POA: Insufficient documentation

## 2021-07-11 DIAGNOSIS — K649 Unspecified hemorrhoids: Secondary | ICD-10-CM | POA: Diagnosis not present

## 2021-07-11 DIAGNOSIS — Z8249 Family history of ischemic heart disease and other diseases of the circulatory system: Secondary | ICD-10-CM | POA: Diagnosis not present

## 2021-07-11 DIAGNOSIS — Z8261 Family history of arthritis: Secondary | ICD-10-CM | POA: Diagnosis not present

## 2021-07-11 DIAGNOSIS — D500 Iron deficiency anemia secondary to blood loss (chronic): Secondary | ICD-10-CM

## 2021-07-11 DIAGNOSIS — E785 Hyperlipidemia, unspecified: Secondary | ICD-10-CM | POA: Insufficient documentation

## 2021-07-11 DIAGNOSIS — Z885 Allergy status to narcotic agent status: Secondary | ICD-10-CM | POA: Insufficient documentation

## 2021-07-11 DIAGNOSIS — Z811 Family history of alcohol abuse and dependence: Secondary | ICD-10-CM | POA: Insufficient documentation

## 2021-07-11 DIAGNOSIS — Z880 Allergy status to penicillin: Secondary | ICD-10-CM | POA: Diagnosis not present

## 2021-07-11 DIAGNOSIS — Z836 Family history of other diseases of the respiratory system: Secondary | ICD-10-CM | POA: Diagnosis not present

## 2021-07-11 DIAGNOSIS — E039 Hypothyroidism, unspecified: Secondary | ICD-10-CM | POA: Insufficient documentation

## 2021-07-11 DIAGNOSIS — Z87891 Personal history of nicotine dependence: Secondary | ICD-10-CM | POA: Insufficient documentation

## 2021-07-11 DIAGNOSIS — Z818 Family history of other mental and behavioral disorders: Secondary | ICD-10-CM | POA: Insufficient documentation

## 2021-07-11 DIAGNOSIS — Z808 Family history of malignant neoplasm of other organs or systems: Secondary | ICD-10-CM | POA: Insufficient documentation

## 2021-07-11 LAB — IRON AND TIBC
Iron: 84 ug/dL (ref 28–170)
Saturation Ratios: 24 % (ref 10.4–31.8)
TIBC: 347 ug/dL (ref 250–450)
UIBC: 263 ug/dL

## 2021-07-11 LAB — CBC WITH DIFFERENTIAL/PLATELET
Abs Immature Granulocytes: 0.01 10*3/uL (ref 0.00–0.07)
Basophils Absolute: 0.1 10*3/uL (ref 0.0–0.1)
Basophils Relative: 1 %
Eosinophils Absolute: 0.1 10*3/uL (ref 0.0–0.5)
Eosinophils Relative: 3 %
HCT: 35.9 % — ABNORMAL LOW (ref 36.0–46.0)
Hemoglobin: 10.6 g/dL — ABNORMAL LOW (ref 12.0–15.0)
Immature Granulocytes: 0 %
Lymphocytes Relative: 25 %
Lymphs Abs: 1.3 10*3/uL (ref 0.7–4.0)
MCH: 19.8 pg — ABNORMAL LOW (ref 26.0–34.0)
MCHC: 29.5 g/dL — ABNORMAL LOW (ref 30.0–36.0)
MCV: 67.1 fL — ABNORMAL LOW (ref 80.0–100.0)
Monocytes Absolute: 0.5 10*3/uL (ref 0.1–1.0)
Monocytes Relative: 10 %
Neutro Abs: 3.3 10*3/uL (ref 1.7–7.7)
Neutrophils Relative %: 61 %
Platelets: 277 10*3/uL (ref 150–400)
RBC: 5.35 MIL/uL — ABNORMAL HIGH (ref 3.87–5.11)
RDW: 15.2 % (ref 11.5–15.5)
WBC: 5.4 10*3/uL (ref 4.0–10.5)
nRBC: 0.4 % — ABNORMAL HIGH (ref 0.0–0.2)

## 2021-07-11 LAB — FERRITIN: Ferritin: 137 ng/mL (ref 11–307)

## 2021-07-12 LAB — CMP14+EGFR
ALT: 14 IU/L (ref 0–32)
AST: 19 IU/L (ref 0–40)
Albumin/Globulin Ratio: 2.1 (ref 1.2–2.2)
Albumin: 4.5 g/dL (ref 3.8–4.8)
Alkaline Phosphatase: 71 IU/L (ref 44–121)
BUN/Creatinine Ratio: 18 (ref 12–28)
BUN: 12 mg/dL (ref 8–27)
Bilirubin Total: 0.3 mg/dL (ref 0.0–1.2)
CO2: 23 mmol/L (ref 20–29)
Calcium: 9.1 mg/dL (ref 8.7–10.3)
Chloride: 100 mmol/L (ref 96–106)
Creatinine, Ser: 0.65 mg/dL (ref 0.57–1.00)
Globulin, Total: 2.1 g/dL (ref 1.5–4.5)
Glucose: 86 mg/dL (ref 70–99)
Potassium: 4.3 mmol/L (ref 3.5–5.2)
Sodium: 138 mmol/L (ref 134–144)
Total Protein: 6.6 g/dL (ref 6.0–8.5)
eGFR: 100 mL/min/{1.73_m2} (ref >59)

## 2021-07-12 LAB — TSH+T4F+T3FREE
Free T4: 0.99 ng/dL (ref 0.82–1.77)
T3, Free: 3.3 pg/mL (ref 2.0–4.4)
TSH: 5.73 u[IU]/mL — ABNORMAL HIGH (ref 0.450–4.500)

## 2021-07-12 LAB — CBC WITH DIFFERENTIAL/PLATELET
Basophils Absolute: 0.1 10*3/uL (ref 0.0–0.2)
Basos: 1 %
EOS (ABSOLUTE): 0.1 10*3/uL (ref 0.0–0.4)
Eos: 3 %
Hematocrit: 35.7 % (ref 34.0–46.6)
Hemoglobin: 11 g/dL — ABNORMAL LOW (ref 11.1–15.9)
Immature Grans (Abs): 0 10*3/uL (ref 0.0–0.1)
Immature Granulocytes: 0 %
Lymphocytes Absolute: 1.5 10*3/uL (ref 0.7–3.1)
Lymphs: 27 %
MCH: 20.1 pg — ABNORMAL LOW (ref 26.6–33.0)
MCHC: 30.8 g/dL — ABNORMAL LOW (ref 31.5–35.7)
MCV: 65 fL — ABNORMAL LOW (ref 79–97)
Monocytes Absolute: 0.5 10*3/uL (ref 0.1–0.9)
Monocytes: 9 %
Neutrophils Absolute: 3.3 10*3/uL (ref 1.4–7.0)
Neutrophils: 60 %
Platelets: 287 10*3/uL (ref 150–450)
RBC: 5.46 x10E6/uL — ABNORMAL HIGH (ref 3.77–5.28)
RDW: 15.8 % — ABNORMAL HIGH (ref 11.7–15.4)
WBC: 5.5 10*3/uL (ref 3.4–10.8)

## 2021-07-12 LAB — LIPID PANEL WITH LDL/HDL RATIO
Cholesterol, Total: 249 mg/dL — ABNORMAL HIGH (ref 100–199)
HDL: 57 mg/dL (ref >39)
LDL Chol Calc (NIH): 152 mg/dL — ABNORMAL HIGH (ref 0–99)
LDL/HDL Ratio: 2.7 ratio (ref 0.0–3.2)
Triglycerides: 218 mg/dL — ABNORMAL HIGH (ref 0–149)
VLDL Cholesterol Cal: 40 mg/dL (ref 5–40)

## 2021-07-12 LAB — VITAMIN D 25 HYDROXY (VIT D DEFICIENCY, FRACTURES): Vit D, 25-Hydroxy: 66.3 ng/mL (ref 30.0–100.0)

## 2021-07-14 ENCOUNTER — Other Ambulatory Visit: Payer: Self-pay | Admitting: Nurse Practitioner

## 2021-07-14 DIAGNOSIS — L920 Granuloma annulare: Secondary | ICD-10-CM

## 2021-07-14 LAB — HGB FRACTIONATION CASCADE
Hgb A2: 4.6 % — ABNORMAL HIGH (ref 1.8–3.2)
Hgb A: 95.4 % — ABNORMAL LOW (ref 96.4–98.8)
Hgb F: 0 % (ref 0.0–2.0)
Hgb S: 0 %

## 2021-07-19 ENCOUNTER — Telehealth: Payer: Self-pay | Admitting: Internal Medicine

## 2021-07-19 NOTE — Telephone Encounter (Signed)
Pt is scheduled with Dr Gala Romney on 11/28 and wants to know if she could change her time to 0830 or 0900 due to transportation issues. Please advise. [phone]

## 2021-07-19 NOTE — Telephone Encounter (Signed)
Called pt. Procedure time moved to 11:30am but aware to arrive at 10:00am.

## 2021-07-23 NOTE — Progress Notes (Signed)
[place], [place] [address]   CLINIC:  Medical Oncology/Hematology  PCP:  Noreene Larsson, NP [address]  [address] [place] [address] [phone]   REASON FOR VISIT:  Follow-up for iron deficiency anemia  PRIOR THERAPY: IV Venofer & IV Feraheme  INTERVAL HISTORY:  Kathleen Erickson 60 y.o. female returns for routine follow-up of her iron deficiency anemia.  She was last evaluated via telemedicine visit by Tarri Abernethy on 04/22/2021.  Patient had a reaction to IV Venofer on 04/25/2021 with peripheral edema, hypotension (BP 62/35), and mottling of her lower extremities.  She was treated with IV Benadryl, IV Solu-Medrol, and IV fluids and was discharged home safely after additional monitoring in the emergency department.  Unfortunately, she also had a reaction to IV Feraheme (05/09/2021) with leg swelling and a rash on her face, which occurred despite premedication with IV Solu-Medrol, Benadryl, and Pepcid.  At today's visit, she reports feeling fairly well.  No recent hospitalizations, surgeries, or changes in baseline health status.  Despite the allergic reactions to IV iron, she does report that her overall energy is improved after iron infusions.  She reports occasional scant rectal bleeding due to hemorrhoids, but denies any major bleeding episodes such as gross hematochezia, melena, or hematemesis.  She denies any current fatigue, pica, restless legs, exertional chest pain, dyspnea on exertion, lightheadedness, or syncope.  (She does report some musculoskeletal chest pain that is reproducible on exam, likely secondary to lifting heavy objects yesterday.)  She has 80% energy and 100% appetite. She endorses that she is maintaining a stable weight.    REVIEW OF SYSTEMS:  Review of Systems  Constitutional:  Negative for appetite change, chills, diaphoresis, fatigue, fever and unexpected weight change.  HENT:   Negative for lump/mass  and nosebleeds.   Eyes:  Negative for eye problems.  Respiratory:  Positive for cough. Negative for hemoptysis and shortness of breath.   Cardiovascular:  Positive for chest pain (Musculoskeletal chest pain after lifting small child). Negative for leg swelling and palpitations.  Gastrointestinal:  Positive for constipation. Negative for abdominal pain, blood in stool, diarrhea, nausea and vomiting.  Genitourinary:  Negative for hematuria.   Skin: Negative.   Neurological:  Negative for dizziness, headaches and light-headedness.  Hematological:  Does not bruise/bleed easily.     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 Rourk:External and internal hemorrhoids-grade 4; otherwise normal rectum total colon and terminal ileum.   COLONOSCOPY WITH PROPOFOL N/A 11/26/2017   external and internal hemorrhoids, prominent anal papilla, normal rectum and colon, normal TI.    COLPOSCOPY     ESOPHAGOGASTRODUODENOSCOPY N/A 06/22/2014   Dr. Gala Romney:The  mucosa of the esophagus appeared normal  Single gastric ulcer ranging between 3-5 mm in size was found   Biopsy with negative H.pylori.    ESOPHAGOGASTRODUODENOSCOPY N/A 10/28/2014   Dr. Gala Romney: patulous EG junction, small hiatal hernia, previous noted gastric ulcer healed   ESOPHAGOGASTRODUODENOSCOPY (EGD) WITH PROPOFOL N/A 11/26/2017   normal  esophagus, normal stomach, normal duodenum.    GIVENS CAPSULE STUDY N/A 02/14/2021   Procedure: GIVENS CAPSULE STUDY;  Surgeon: Daneil Dolin, MD;  Location: [facility];  Service: Endoscopy;  Laterality: N/A;  7:30am   VAGINAL HYSTERECTOMY     bleeding; she thinks it was a partial and has ovaries in place     SOCIAL HISTORY:  Social History   Socioeconomic History   Marital status: Legally Separated    Spouse name: [name]   Number of children: 2   Years of education: 10   Highest education level: Not on file  Occupational History   Occupation: not working    Comment: cleaned houses    Comment: disabled from arthritis  Tobacco Use   Smoking status: Former    Packs/day: 0.25    Years: 40.00    Pack years: 10.00    Types: Cigarettes    Start date: 09/25/1973    Quit date: 05/17/2020    Years since quitting: 1.1   Smokeless tobacco: Never  Vaping Use   Vaping Use: Never used  Substance and Sexual Activity   Alcohol use: No   Drug use: No   Sexual activity: Not Currently  Other Topics Concern   Not on file  Social History Narrative   Visits sister - stays at home   Social Determinants of Health   Financial Resource Strain: Not on file  Food Insecurity: Not on file  Transportation Needs: Not on file  Physical Activity: Not on file  Stress: Not on file  Social Connections: Not on file  Intimate Partner Violence: Not on file    FAMILY HISTORY:  Family History  Problem Relation Age of Onset   Uterine cancer Mother    Heart disease Mother    Alcohol abuse Mother    Arthritis Mother    COPD Mother    Depression Mother    Ovarian cancer Mother        dx 37s   Heart disease Father        age 36, family questions autopsy   Alcohol abuse Father    Arthritis Father    Depression Father    Early death Father    Cancer Other        ulcer, aunt   Lung cancer Cousin        dx 68s, then again in her 71s (maternal first cousin)   Throat cancer Cousin    Asthma  Son    COPD Sister    Lung cancer Maternal Aunt        dx 60s/70s, smoker, ulcer   Cancer Maternal Uncle        dx 19s, unknown primary (affected eye/head, colon, stomach), chewed tobacco   Gout Paternal Uncle    Seizures Paternal Uncle    Lung cancer Maternal Aunt        dx 71s, non smoker   Cancer Maternal Aunt        dx 87s, mouth cancer, chewed tobacco   Skin cancer Maternal Uncle    Skin cancer Maternal Uncle    Breast cancer Niece 9   Cancer Niece  unknown type, dx 77s   Lung cancer Cousin        dx late 64s (maternal first cousin)   Skin cancer Cousin        multiple (maternal first cousin)   Colon cancer Neg Hx    Ulcers Neg Hx     CURRENT MEDICATIONS:  Outpatient Encounter Medications as of 07/25/2021  Medication Sig   bisacodyl (GENTLE LAXATIVE) 5 MG EC tablet Take 5 mg by mouth daily as needed for moderate constipation.   Cholecalciferol (VITAMIN D-3) 125 MCG (5000 UT) TABS Take 10,000 Units by mouth daily.   clobetasol ointment (TEMOVATE) 0.05 % APPLY TO AFFECTED AREA TWICE A DAY   desvenlafaxine (PRISTIQ) 50 MG 24 hr tablet Take 1 tablet (50 mg total) by mouth daily.   estradiol (ESTRACE) 2 MG tablet TAKE 1 TABLET BY MOUTH EVERY DAY (Patient taking differently: Take 2 mg by mouth daily.)   hydrocortisone (ANUSOL-HC) 2.5 % rectal cream Place 1 application rectally 2 (two) times daily.   levothyroxine (SYNTHROID) 50 MCG tablet TAKE 1 TABLET BY MOUTH EVERY DAY (Patient taking differently: Take 50 mcg by mouth daily.)   liothyronine (CYTOMEL) 5 MCG tablet Take 2 tablets (10 mcg total) by mouth daily. (Patient taking differently: Take 5 mcg by mouth daily.)   lubiprostone (AMITIZA) 24 MCG capsule Take 1 capsule (24 mcg total) by mouth 2 (two) times daily with a meal.   mometasone (ELOCON) 0.1 % cream Apply 1 application topically daily as needed.   Omega-3 Fatty Acids (FISH OIL) 1000 MG CAPS Take 1,000 mg by mouth daily.   omeprazole (PRILOSEC) 40 MG capsule TAKE  1 CAPSULE BY MOUTH ONCE OR TWICE DAILY BEFORE MEALS.   PROAIR HFA 108 (90 Base) MCG/ACT inhaler TAKE 2 PUFFS BY MOUTH EVERY 6 HOURS AS NEEDED FOR WHEEZE OR SHORTNESS OF BREATH   TRULANCE 3 MG TABS Take 1 tablet by mouth daily. (Patient taking differently: Take 3 mg by mouth daily.)   vitamin B-12 (CYANOCOBALAMIN) 250 MCG tablet Take 250 mcg by mouth daily.   No facility-administered encounter medications on file as of 07/25/2021.    ALLERGIES:  Allergies  Allergen Reactions   Venofer [Iron Sucrose] Other (See Comments)    Patient had a hypersensitivity reaction to Venofer.  See progress note from 04/25/21.   Feraheme [Ferumoxytol] Swelling and Rash   Penicillins Hives    Has patient had a PCN reaction causing immediate rash, facial/tongue/throat swelling, SOB or lightheadedness with hypotension: No Has patient had a PCN reaction causing severe rash involving mucus membranes or skin necrosis: Yes Has patient had a PCN reaction that required hospitalization: No Has patient had a PCN reaction occurring within the last 10 years: No If all of the above answers are "NO", then may proceed with Cephalosporin use.    Morphine And Related Itching           PHYSICAL EXAM:  ECOG PERFORMANCE STATUS: 0 - Asymptomatic  There were no vitals filed for this visit. There were no vitals filed for this visit. Physical Exam Constitutional:      Appearance: Normal appearance.  HENT:     Head: Normocephalic and atraumatic.     Mouth/Throat:     Mouth: Mucous membranes are moist.  Eyes:     Extraocular Movements: Extraocular movements intact.     Pupils: Pupils are equal, round, and reactive to light.  Cardiovascular:     Rate and Rhythm: Normal rate and regular rhythm.  Pulses: Normal pulses.     Heart sounds: Normal heart sounds.  Pulmonary:     Effort: Pulmonary effort is normal.     Breath sounds: Normal breath sounds.  Abdominal:     General: Bowel sounds are normal.     Palpations:  Abdomen is soft.     Tenderness: There is no abdominal tenderness.  Musculoskeletal:        General: No swelling.     Right lower leg: No edema.     Left lower leg: No edema.  Lymphadenopathy:     Cervical: No cervical adenopathy.  Skin:    General: Skin is warm and dry.  Neurological:     General: No focal deficit present.     Mental Status: She is alert and oriented to person, place, and time.  Psychiatric:        Mood and Affect: Mood normal.        Behavior: Behavior normal.     LABORATORY DATA:  I have reviewed the labs as listed.  CBC    Component Value Date/Time   WBC 5.4 07/11/2021 0836   WBC 5.5 07/11/2021 0807   RBC 5.35 (H) 07/11/2021 0836   RBC 5.46 (H) 07/11/2021 0807   HGB 10.6 (L) 07/11/2021 0836   HGB 11.0 (L) 07/11/2021 0807   HCT 35.9 (L) 07/11/2021 0836   HCT 35.7 07/11/2021 0807   PLT 277 07/11/2021 0836   PLT 287 07/11/2021 0807   MCV 67.1 (L) 07/11/2021 0836   MCV 65 (L) 07/11/2021 0807   MCH 19.8 (L) 07/11/2021 0836   MCH 20.1 (L) 07/11/2021 0807   MCHC 29.5 (L) 07/11/2021 0836   MCHC 30.8 (L) 07/11/2021 0807   RDW 15.2 07/11/2021 0836   RDW 15.8 (H) 07/11/2021 0807   LYMPHSABS 1.3 07/11/2021 0836   LYMPHSABS 1.5 07/11/2021 0807   MONOABS 0.5 07/11/2021 0836   EOSABS 0.1 07/11/2021 0836   EOSABS 0.1 07/11/2021 0807   BASOSABS 0.1 07/11/2021 0836   BASOSABS 0.1 07/11/2021 0807   CMP Latest Ref Rng & Units 07/11/2021 11/17/2020 07/06/2020  Glucose 70 - 99 mg/dL 86 88 85  BUN 8 - 27 mg/dL 12 13 13   Creatinine 0.57 - 1.00 mg/dL 0.65 0.81 0.54  Sodium 134 - 144 mmol/L 138 139 139  Potassium 3.5 - 5.2 mmol/L 4.3 4.0 4.3  Chloride 96 - 106 mmol/L 100 102 103  CO2 20 - 29 mmol/L 23 31 28   Calcium 8.7 - 10.3 mg/dL 9.1 9.0 9.3  Total Protein 6.0 - 8.5 g/dL 6.6 6.6 6.8  Total Bilirubin 0.0 - 1.2 mg/dL 0.3 0.4 0.4  Alkaline Phos 44 - 121 IU/L 71 - -  AST 0 - 40 IU/L 19 16 17   ALT 0 - 32 IU/L 14 11 10     DIAGNOSTIC IMAGING:  I have  independently reviewed the relevant imaging and discussed with the patient.  ASSESSMENT & PLAN: 1.  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 - Failed oral iron supplementation due to severe constipation - Received IV iron supplementation with IV Venafer x4 doses (800 mg total) from 01/21/2021 through 01/31/2021, which she tolerated well -  Reaction to IV Venofer on 04/25/2021 with peripheral edema, hypotension (BP 62/35) and mottling of lower extremities, successfully treated with IV Benadryl and IV Solu-Medrol - Reaction to IV Feraheme on 05/09/2021 with leg swelling and rash on her face despite premedication with IV Solu-Medrol, Benadryl, and Pepcid - Patient reports that she has family history of thalassemia - patient was tested for thalassemia many decades ago (age 56), does not remember the results.  Review of past labs does show baseline Hgb 10.0-11.0 with persistent microcytosis (MCV in the 60s) despite adequate iron storage. - Hemoglobin fractionation (07/11/2021) consistent with beta thalassemia minor - No major bleeding events such as hematemesis, hematochezia, or melena. - Symptoms are improved after IV iron repletion - Most recent labs (07/11/2021): Hgb 10.6/MCV 67.1, ferritin 137, iron saturation 24%  - PLAN: Hemoglobin at baseline, iron stores adequate.  Repeat CBC/iron panel and RTC in 6 months. - In order to maintain adequate iron storage, recommend starting patient on oral iron supplement such as ferrous bisglycinate, ferrous gluconate, or liquid iron supplement - which may be better tolerated by  patient in the setting of severe constipation. - Continue follow-up with gastroenterology for ongoing management of constipation (currently on Amitiza) - Continue with EGD/colonoscopy as scheduled by gastroenterology. -** Due to serious reactions to both IV Venofer and IV Feraheme (despite premeds), we will recommend against using any IV iron products in the future.   2.  Family history of ovarian cancer - Patient's mother had ovarian cancer - PLAN: Referral placed to genetics counselor last appointment   PLAN SUMMARY & DISPOSITION: - Oral iron supplementation with ferrous bisglycinate every other day - Repeat labs and RTC in 6 months  All questions were answered. The patient knows to call the clinic with any problems, questions or concerns.  Medical decision making: Low  Time spent on visit: I spent 15 minutes counseling the patient face to face. The total time spent in the appointment was 25 minutes and more than 50% was on counseling.   Harriett Rush, PA-C  07/25/2021 11:27 AM

## 2021-07-25 ENCOUNTER — Other Ambulatory Visit: Payer: Self-pay

## 2021-07-25 ENCOUNTER — Inpatient Hospital Stay (HOSPITAL_BASED_OUTPATIENT_CLINIC_OR_DEPARTMENT_OTHER): Payer: Medicaid Other | Admitting: Physician Assistant

## 2021-07-25 VITALS — BP 127/54 | HR 61 | Temp 98.0°F | Resp 18 | Wt 143.4 lb

## 2021-07-25 DIAGNOSIS — K649 Unspecified hemorrhoids: Secondary | ICD-10-CM | POA: Diagnosis not present

## 2021-07-25 DIAGNOSIS — R059 Cough, unspecified: Secondary | ICD-10-CM | POA: Diagnosis not present

## 2021-07-25 DIAGNOSIS — R079 Chest pain, unspecified: Secondary | ICD-10-CM | POA: Diagnosis not present

## 2021-07-25 DIAGNOSIS — M7989 Other specified soft tissue disorders: Secondary | ICD-10-CM | POA: Diagnosis not present

## 2021-07-25 DIAGNOSIS — Z79899 Other long term (current) drug therapy: Secondary | ICD-10-CM | POA: Diagnosis not present

## 2021-07-25 DIAGNOSIS — K5900 Constipation, unspecified: Secondary | ICD-10-CM | POA: Diagnosis not present

## 2021-07-25 DIAGNOSIS — Z885 Allergy status to narcotic agent status: Secondary | ICD-10-CM | POA: Diagnosis not present

## 2021-07-25 DIAGNOSIS — D563 Thalassemia minor: Secondary | ICD-10-CM | POA: Diagnosis not present

## 2021-07-25 DIAGNOSIS — E039 Hypothyroidism, unspecified: Secondary | ICD-10-CM | POA: Diagnosis not present

## 2021-07-25 DIAGNOSIS — Z880 Allergy status to penicillin: Secondary | ICD-10-CM | POA: Diagnosis not present

## 2021-07-25 DIAGNOSIS — D500 Iron deficiency anemia secondary to blood loss (chronic): Secondary | ICD-10-CM | POA: Diagnosis not present

## 2021-07-25 DIAGNOSIS — E785 Hyperlipidemia, unspecified: Secondary | ICD-10-CM | POA: Diagnosis not present

## 2021-07-25 DIAGNOSIS — R21 Rash and other nonspecific skin eruption: Secondary | ICD-10-CM | POA: Diagnosis not present

## 2021-07-25 DIAGNOSIS — Z8249 Family history of ischemic heart disease and other diseases of the circulatory system: Secondary | ICD-10-CM | POA: Diagnosis not present

## 2021-07-25 DIAGNOSIS — Z803 Family history of malignant neoplasm of breast: Secondary | ICD-10-CM | POA: Diagnosis not present

## 2021-07-25 DIAGNOSIS — Z8049 Family history of malignant neoplasm of other genital organs: Secondary | ICD-10-CM | POA: Diagnosis not present

## 2021-07-25 DIAGNOSIS — D509 Iron deficiency anemia, unspecified: Secondary | ICD-10-CM | POA: Diagnosis not present

## 2021-07-25 DIAGNOSIS — Z8041 Family history of malignant neoplasm of ovary: Secondary | ICD-10-CM | POA: Diagnosis not present

## 2021-07-25 DIAGNOSIS — K219 Gastro-esophageal reflux disease without esophagitis: Secondary | ICD-10-CM | POA: Diagnosis not present

## 2021-07-25 DIAGNOSIS — Z87891 Personal history of nicotine dependence: Secondary | ICD-10-CM | POA: Diagnosis not present

## 2021-07-25 DIAGNOSIS — Z8541 Personal history of malignant neoplasm of cervix uteri: Secondary | ICD-10-CM | POA: Diagnosis not present

## 2021-07-25 DIAGNOSIS — Z801 Family history of malignant neoplasm of trachea, bronchus and lung: Secondary | ICD-10-CM | POA: Diagnosis not present

## 2021-07-25 DIAGNOSIS — Z836 Family history of other diseases of the respiratory system: Secondary | ICD-10-CM | POA: Diagnosis not present

## 2021-07-25 DIAGNOSIS — Z8261 Family history of arthritis: Secondary | ICD-10-CM | POA: Diagnosis not present

## 2021-07-25 DIAGNOSIS — Z8719 Personal history of other diseases of the digestive system: Secondary | ICD-10-CM | POA: Diagnosis not present

## 2021-07-25 NOTE — Patient Instructions (Signed)
[place] at [facility] Discharge Instructions  You were seen today by Tarri Abernethy PA-C for your iron deficiency anemia.  Your labs today looked much better.  Because of your beta thalassemia, your hemoglobin will always be slightly lower than "normal," but for you, your blood counts are currently at a good level.  Your iron levels are also at a normal level.  However, we do not want them to get lower in the future, since you have had a serious reaction to IV iron treatments.  Recommend that you try taking "ferrous bisglycinate," which is an iron supplement that tends to be more gentle on your stomach and digestive system.  Start by taking it every other day, and if you are not having any major side effects from it, you can increase to taking daily.  LABS: Return in 6 months for repeat labs  OTHER TESTS: Continue follow-up with gastroenterology  MEDICATIONS: Start taking FERROUS BISGLYCINATE (iron supplements, available at some drugstores over-the-counter, as well as online via [employer] or similar)  FOLLOW-UP APPOINTMENT: Office visit in 6 months, after labs   Thank you for choosing [place] at [facility] to provide your oncology and hematology care.  To afford each patient quality time with our provider, please arrive at least 15 minutes before your scheduled appointment time.   If you have a lab appointment with the [facility] please come in thru the Main Entrance and check in at the main information desk.  You need to re-schedule your appointment should you arrive 10 or more minutes late.  We strive to give you quality time with our providers, and arriving late affects you and other patients whose appointments are after yours.  Also, if you no show three or more times for appointments you may be dismissed from the clinic at the providers discretion.     Again, thank you for choosing [facility].  Our hope is that these  requests will decrease the amount of time that you wait before being seen by our physicians.       _____________________________________________________________  Should you have questions after your visit to [facility], please contact our office at [phone] and follow the prompts.  Our office hours are 8:00 a.m. and 4:30 p.m. Monday - Friday.  Please note that voicemails left after 4:00 p.m. may not be returned until the following business day.  We are closed weekends and major holidays.  You do have access to a nurse 24-7, just call the main number to the clinic [phone] and do not press any options, hold on the line and a nurse will answer the phone.    For prescription refill requests, have your pharmacy contact our office and allow 72 hours.    Due to Covid, you will need to wear a mask upon entering the hospital. If you do not have a mask, a mask will be given to you at the Main Entrance upon arrival. For doctor visits, patients may have 1 support person age 52 or older with them. For treatment visits, patients can not have anyone with them due to social distancing guidelines and our immunocompromised population.

## 2021-08-22 ENCOUNTER — Encounter: Payer: Self-pay | Admitting: Internal Medicine

## 2021-08-22 ENCOUNTER — Encounter (HOSPITAL_COMMUNITY)
Admission: RE | Disposition: A | Discharge status: Home / Self Care | Payer: Self-pay | Source: Home / Self Care | Attending: Internal Medicine

## 2021-08-22 ENCOUNTER — Ambulatory Visit (HOSPITAL_COMMUNITY): Payer: Medicaid Other | Admitting: Anesthesiology

## 2021-08-22 ENCOUNTER — Encounter (HOSPITAL_COMMUNITY): Payer: Self-pay | Admitting: Internal Medicine

## 2021-08-22 ENCOUNTER — Ambulatory Visit (HOSPITAL_COMMUNITY)
Admission: RE | Admit: 2021-08-22 | Discharge: 2021-08-22 | Disposition: A | Discharge status: Home / Self Care | Payer: Medicaid Other | Attending: Internal Medicine | Admitting: Internal Medicine

## 2021-08-22 ENCOUNTER — Other Ambulatory Visit: Payer: Self-pay

## 2021-08-22 DIAGNOSIS — K5900 Constipation, unspecified: Secondary | ICD-10-CM | POA: Diagnosis not present

## 2021-08-22 DIAGNOSIS — Z8711 Personal history of peptic ulcer disease: Secondary | ICD-10-CM | POA: Diagnosis not present

## 2021-08-22 DIAGNOSIS — Q2739 Arteriovenous malformation, other site: Secondary | ICD-10-CM | POA: Diagnosis not present

## 2021-08-22 DIAGNOSIS — Z8541 Personal history of malignant neoplasm of cervix uteri: Secondary | ICD-10-CM | POA: Insufficient documentation

## 2021-08-22 DIAGNOSIS — K921 Melena: Secondary | ICD-10-CM | POA: Diagnosis not present

## 2021-08-22 DIAGNOSIS — Z85840 Personal history of malignant neoplasm of eye: Secondary | ICD-10-CM | POA: Insufficient documentation

## 2021-08-22 DIAGNOSIS — Z79899 Other long term (current) drug therapy: Secondary | ICD-10-CM | POA: Insufficient documentation

## 2021-08-22 DIAGNOSIS — K31819 Angiodysplasia of stomach and duodenum without bleeding: Secondary | ICD-10-CM

## 2021-08-22 DIAGNOSIS — E039 Hypothyroidism, unspecified: Secondary | ICD-10-CM | POA: Diagnosis not present

## 2021-08-22 DIAGNOSIS — K319 Disease of stomach and duodenum, unspecified: Secondary | ICD-10-CM | POA: Diagnosis not present

## 2021-08-22 DIAGNOSIS — K644 Residual hemorrhoidal skin tags: Secondary | ICD-10-CM | POA: Insufficient documentation

## 2021-08-22 DIAGNOSIS — D509 Iron deficiency anemia, unspecified: Secondary | ICD-10-CM | POA: Diagnosis not present

## 2021-08-22 DIAGNOSIS — K643 Fourth degree hemorrhoids: Secondary | ICD-10-CM | POA: Diagnosis not present

## 2021-08-22 HISTORY — PX: ESOPHAGOGASTRODUODENOSCOPY (EGD) WITH PROPOFOL: SHX5813

## 2021-08-22 HISTORY — PX: HEMOSTASIS CLIP PLACEMENT: SHX6857

## 2021-08-22 HISTORY — PX: BIOPSY: SHX5522

## 2021-08-22 HISTORY — PX: HOT HEMOSTASIS: SHX5433

## 2021-08-22 HISTORY — PX: COLONOSCOPY WITH PROPOFOL: SHX5780

## 2021-08-22 SURGERY — COLONOSCOPY WITH PROPOFOL
Anesthesia: General

## 2021-08-22 MED ORDER — LACTATED RINGERS IV SOLN: INTRAVENOUS | Status: DC

## 2021-08-22 MED ORDER — PROPOFOL 10 MG/ML IV BOLUS
INTRAVENOUS | Status: DC | PRN
  Administered 2021-08-22: 60 mg via INTRAVENOUS

## 2021-08-22 MED ORDER — LACTATED RINGERS IV SOLN: INTRAVENOUS | Status: DC | PRN

## 2021-08-22 MED ORDER — LIDOCAINE HCL (CARDIAC) PF 100 MG/5ML IV SOSY
PREFILLED_SYRINGE | INTRAVENOUS | Status: DC | PRN
  Administered 2021-08-22: 50 mg via INTRAVENOUS

## 2021-08-22 MED ORDER — STERILE WATER FOR IRRIGATION IR SOLN
Status: DC | PRN
  Administered 2021-08-22: 120 mL

## 2021-08-22 MED ORDER — PROPOFOL 500 MG/50ML IV EMUL
INTRAVENOUS | Status: DC | PRN
  Administered 2021-08-22: 150 ug/kg/min via INTRAVENOUS

## 2021-08-22 NOTE — H&P (Signed)
@LOGO @   Primary Care Physician:  Noreene Larsson, NP Primary Gastroenterologist:  Dr. Gala Romney  Pre-Procedure History & Physical: HPI:  Kathleen Erickson is a 60 y.o. female here for further evaluation of ongoing iron deficiency anemia paper hematochezia.  Prior EGD and colonoscopy unrevealing almost 3 years ago however, capsule study indicated gastric duodenal ulcer ration.  Follow-up EGD recommended but patient held off until now. She is required parenteral iron supplementation but that has become problematic with allergic reactions.  She denies dysphagia.  Denies reflux symptoms.  Still struggles with constipation taking Amitiza 24 mcg twice daily.  Felt Linzess 290 once daily worked better than all the others.  Not taking Trulance.  Denies any form of NSAIDs.  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 Mannie Wineland:External and internal hemorrhoids-grade 4; otherwise normal rectum total colon and terminal ileum.   COLONOSCOPY WITH PROPOFOL N/A 11/26/2017   external and internal hemorrhoids, prominent anal papilla, normal rectum and colon, normal TI.    COLPOSCOPY     ESOPHAGOGASTRODUODENOSCOPY N/A 06/22/2014   Dr. Gala Romney:The mucosa of the esophagus appeared normal  Single gastric ulcer ranging between 3-5 mm in size was found   Biopsy with negative H.pylori.    ESOPHAGOGASTRODUODENOSCOPY N/A 10/28/2014   Dr. Gala Romney:  patulous EG junction, small hiatal hernia, previous noted gastric ulcer healed   ESOPHAGOGASTRODUODENOSCOPY (EGD) WITH PROPOFOL N/A 11/26/2017   normal esophagus, normal stomach, normal duodenum.    GIVENS CAPSULE STUDY N/A 02/14/2021   Procedure: GIVENS CAPSULE STUDY;  Surgeon: Daneil Dolin, MD;  Location: [facility];  Service: Endoscopy;  Laterality: N/A;  7:30am   VAGINAL HYSTERECTOMY     bleeding; she thinks it was a partial and has ovaries in place    Prior to Admission medications   Medication Sig Start Date End Date Taking? Authorizing Provider  Biotin 10000 MCG TABS Take 1,000 mcg by mouth 3 (three) times a week.   Yes [provider]  bisacodyl (GENTLE LAXATIVE) 5 MG EC tablet Take 5 mg by mouth daily as needed for moderate constipation.   Yes [provider]  Cholecalciferol (VITAMIN D-3) 125 MCG (5000 UT) TABS Take 10,000 Units by mouth daily.   Yes [provider]  clobetasol ointment (TEMOVATE) 0.05 % APPLY TO AFFECTED AREA TWICE A DAY Patient taking differently: 1 application daily. 07/14/21  Yes Fayrene Helper, MD  desvenlafaxine (PRISTIQ) 50 MG 24 hr tablet Take 1 tablet (50 mg total) by mouth daily. 02/08/21  Yes Gosrani, Nimish C, MD  estradiol (ESTRACE) 2 MG tablet TAKE 1 TABLET BY MOUTH EVERY DAY Patient taking differently: Take 2 mg by mouth daily. 02/20/21 08/15/21 Yes Doree Albee, MD  hydrocortisone (ANUSOL-HC)  2.5 % rectal cream Place 1 application rectally 2 (two) times daily. 06/30/21  Yes Annitta Needs, NP  levothyroxine (SYNTHROID) 50 MCG tablet TAKE 1 TABLET BY MOUTH EVERY DAY Patient taking differently: Take 50 mcg by mouth daily. 02/20/21  Yes Gosrani, Nimish C, MD  liothyronine (CYTOMEL) 5 MCG tablet Take 2 tablets (10 mcg total) by mouth daily. 11/18/20  Yes Doree Albee, MD  lubiprostone (AMITIZA) 24 MCG capsule Take 1 capsule (24 mcg total) by mouth 2 (two) times daily with a meal. Patient taking differently: Take  24 mcg by mouth daily with breakfast. 06/30/21  Yes Annitta Needs, NP  Omega-3 Fatty Acids (FISH OIL) 1000 MG CAPS Take 2,000 mg by mouth daily.   Yes [provider]  omeprazole (PRILOSEC) 40 MG capsule TAKE 1 CAPSULE BY MOUTH ONCE OR TWICE DAILY BEFORE MEALS. 05/05/21  Yes Erenest Rasher, PA-C  vitamin B-12 (CYANOCOBALAMIN) 250 MCG tablet Take 250 mcg by mouth daily.   Yes [provider]  meclizine (ANTIVERT) 25 MG tablet Take 25 mg by mouth 3 (three) times daily as needed for dizziness.    [provider]  mometasone (ELOCON) 0.1 % cream Apply 1 application topically daily as needed (dry ear).    [provider]  PROAIR HFA 108 (90 Base) MCG/ACT inhaler TAKE 2 PUFFS BY MOUTH EVERY 6 HOURS AS NEEDED FOR WHEEZE OR SHORTNESS OF BREATH 11/30/20   Gosrani, Nimish C, MD  TRULANCE 3 MG TABS Take 1 tablet by mouth daily. Patient not taking: Reported on 08/15/2021 07/06/20   Doree Albee, MD    Allergies as of 07/06/2021 - Review Complete 06/30/2021  Allergen Reaction Noted   Venofer [iron sucrose] Other (See Comments) 04/26/2021   Feraheme [ferumoxytol] Swelling and Rash 05/11/2021   Penicillins Hives 01/20/2014   Morphine and related Itching 01/20/2014    Family History  Problem Relation Age of Onset   Uterine cancer Mother    Heart disease Mother    Alcohol abuse Mother    Arthritis Mother    COPD Mother    Depression Mother    Ovarian cancer Mother        dx 75s   Heart disease Father        age 4, family questions autopsy   Alcohol abuse Father    Arthritis Father    Depression Father    Early death Father    Cancer Other        ulcer, aunt   Lung cancer Cousin        dx 63s, then again in her 68s (maternal first cousin)   Throat cancer Cousin    Asthma Son    COPD Sister    Lung cancer Maternal Aunt        dx 60s/70s, smoker, ulcer   Cancer Maternal Uncle        dx 20s, unknown primary (affected eye/head, colon, stomach), chewed  tobacco   Gout Paternal Uncle    Seizures Paternal Uncle    Lung cancer Maternal Aunt        dx 76s, non smoker   Cancer Maternal Aunt        dx 26s, mouth cancer, chewed tobacco   Skin cancer Maternal Uncle    Skin cancer Maternal Uncle    Breast cancer Niece 29   Cancer Niece        unknown type, dx 77s   Lung cancer Cousin  dx late 31s (maternal first cousin)   Skin cancer Cousin        multiple (maternal first cousin)   Colon cancer Neg Hx    Ulcers Neg Hx     Social History   Socioeconomic History   Marital status: Legally Separated    Spouse name: [name]   Number of children: 2   Years of education: 10   Highest education level: Not on file  Occupational History   Occupation: not working    Comment: cleaned houses    Comment: disabled from arthritis  Tobacco Use   Smoking status: Former    Packs/day: 0.25    Years: 40.00    Pack years: 10.00    Types: Cigarettes    Start date: 09/25/1973    Quit date: 05/17/2020    Years since quitting: 1.2   Smokeless tobacco: Never  Vaping Use   Vaping Use: Never used  Substance and Sexual Activity   Alcohol use: No   Drug use: No   Sexual activity: Not Currently  Other Topics Concern   Not on file  Social History Narrative   Visits sister - stays at home   Social Determinants of Health   Financial Resource Strain: Not on file  Food Insecurity: Not on file  Transportation Needs: Not on file  Physical Activity: Not on file  Stress: Not on file  Social Connections: Not on file  Intimate Partner Violence: Not on file    Review of Systems: See HPI, otherwise negative ROS  Physical Exam: BP 139/66   Temp 97.8 F (36.6 C) (Oral)   Resp 18   Ht 5\' 1"  (1.549 m)   Wt 64.4 kg   SpO2 98%   BMI 26.83 kg/m  General:   Alert,  Well-developed, well-nourished, pleasant and cooperative in NAD Neck:  Supple; no masses or thyromegaly. No significant cervical adenopathy. Lungs:  Clear throughout to auscultation.    No wheezes, crackles, or rhonchi. No acute distress. Heart:  Regular rate and rhythm; no murmurs, clicks, rubs,  or gallops. Abdomen: Non-distended, normal bowel sounds.  Soft and nontender without appreciable mass or hepatosplenomegaly.  Pulses:  Normal pulses noted. Extremities:  Without clubbing or edema.  Impression/Plan: 60 year old lady with a history of GI bleeding history of peptic ulcer disease, paper hematochezia.  With ongoing iron deficiency anemia.  She is here for repeat EGD and colonoscopy per plan. The risks, benefits, limitations, imponderables and alternatives regarding both EGD and colonoscopy have been reviewed with the patient. Questions have been answered. All parties agreeable.       Notice: This dictation was prepared with Dragon dictation along with smaller phrase technology. Any transcriptional errors that result from this process are unintentional and may not be corrected upon review.

## 2021-08-22 NOTE — Op Note (Signed)
[facility] Patient Name: Kathleen Erickson Procedure Date: 08/22/2021 11:30 AM MRN: [id number] Date of Birth: 06-Sep-1961 Attending MD: Norvel Richards , MD CSN: [id number] Age: 60 Admit Type: Outpatient Procedure:                Colonoscopy Indications:              Hematochezia, Iron deficiency anemia Providers:                Norvel Richards, MD, Lambert Mody,                            Kristine L. Risa Grill, Technician Referring MD:              Medicines:                Propofol per Anesthesia Complications:            No immediate complications. Estimated Blood Loss:     Estimated blood loss was minimal. Procedure:                Pre-Anesthesia Assessment:                           - Prior to the procedure, a History and Physical                            was performed, and patient medications and                            allergies were reviewed. The patient's tolerance of                            previous anesthesia was also reviewed. The risks                            and benefits of the procedure and the sedation                            options and risks were discussed with the patient.                            All questions were answered, and informed consent                            was obtained. Prior Anticoagulants: The patient has                            taken no previous anticoagulant or antiplatelet                            agents. ASA Grade Assessment: II - A patient with                            mild systemic disease. After reviewing the risks  and benefits, the patient was deemed in                            satisfactory condition to undergo the procedure.                           After obtaining informed consent, the colonoscope                            was passed under direct vision. Throughout the                            procedure, the patient's blood pressure, pulse, and                             oxygen saturations were monitored continuously. The                            [id number]) scope was introduced through the                            anus and advanced to the 20 cm into the ileum. The                            colonoscopy was performed without difficulty. The                            patient tolerated the procedure well. The ileocecal                            valve, appendiceal orifice, and rectum were                            photographed. Scope In: 11:34:03 AM Scope Out: 11:51:02 AM Scope Withdrawal Time: 0 hours 13 minutes 22 seconds  Total Procedure Duration: 0 hours 16 minutes 59 seconds  Findings:      Hemorrhoids were found on perianal exam.      Non-bleeding external and internal hemorrhoids were found during       retroflexion. The hemorrhoids were severe, large and Grade IV (internal       hemorrhoids that prolapse and cannot be reduced manually).      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 No bleeding stigmata.       Distal 20 cm of terminal ileum appeared normal. The remainder the       colonic mucosa appeared normal.      Utilizing the APC circular probe set on right colon settings the AVM was       ablated with multiple applications at 20 J each. 2 ensure hemostasis a       single resolution clip was placed in excellent position. Impression:               - Hemorrhoids found on perianal exam.                           - Non-bleeding  external and internal hemorrhoids.                           - Cecal AVM ablated. Clip placed. No other lesion                            seen in the lower GI tract/TI to explain IDA via a                            mechanism of blood loss. Moderate Sedation:      Moderate (conscious) sedation was personally administered by an       anesthesia professional. The following parameters were monitored: oxygen       saturation, heart rate, blood pressure, respiratory rate, EKG, adequacy       of  pulmonary ventilation, and response to care. Recommendation:           - Repeat colonoscopy in 10 years for screening                            purposes.                           - Return to GI office in 6 weeks. Stop Amitiza as                            it is not working. Resume Linzess 290 (1) gelcap                            daily. Add MiraLAX 17 g orally to regimen at                            bedtime. No MRI until clip gone. See EGD report for                            additional recommendations. Procedure Code(s):        --- Professional ---                           [phone], Colonoscopy, flexible; diagnostic, including                            collection of specimen(s) by brushing or washing,                            when performed (separate procedure) Diagnosis Code(s):        --- Professional ---                           K64.3, Fourth degree hemorrhoids                           K92.1, Melena (includes Hematochezia)                           D50.9, Iron deficiency anemia, unspecified CPT  copyright 2019 American Medical Association. All rights reserved. The codes documented in this report are preliminary and upon coder review may  be revised to meet current compliance requirements. Cristopher Estimable. Nisaiah Bechtol, MD Norvel Richards, MD 08/22/2021 12:19:53 PM This report has been signed electronically. Number of Addenda: 0

## 2021-08-22 NOTE — Op Note (Addendum)
[facility] Patient Name: Kathleen Erickson Procedure Date: 08/22/2021 10:48 AM MRN: [id number] Date of Birth: 03-May-1961 Attending MD: Norvel Richards , MD CSN: [id number] Age: 60 Admit Type: Outpatient Procedure:                Upper GI endoscopy Indications:              Iron deficiency anemia, Hematochezia, Abnormal                            video capsule endoscopy Providers:                Norvel Richards, MD, Lambert Mody,                            Kristine L. Risa Grill, Technician Referring MD:              Medicines:                Propofol per Anesthesia Complications:            No immediate complications. Estimated Blood Loss:     Estimated blood loss was minimal. Procedure:                After obtaining informed consent, the endoscope was                            passed under direct vision. Throughout the                            procedure, the patient's blood pressure, pulse, and                            oxygen saturations were monitored continuously. The                            [id number] ([id number] scope was introduced through the                            mouth, and advanced to the third part of duodenum.                            The upper GI endoscopy was accomplished without                            difficulty. The patient tolerated the procedure                            well. Scope In: 11:18:37 AM Scope Out: 11:27:24 AM Total Procedure Duration: 0 hours 8 minutes 47 seconds  Findings:      The examined esophagus was normal.      Minimally polypoid appearing but otherwise normal-appearing gastric       mucosa. No blood in the stomach.      The duodenal bulb, second portion of the duodenum and third portion of       the duodenum were normal. Although, it is notable I saw a wisp of very,       very  dark bile stained fluid extruded from the mucosa in the area of the       ampulla; I did not see the ampullary orifice directly. This  rapidly       dissipated. I observed this area for a good 5 minutes and did not see       any more extrusion. Finally, biopsies of the gastric and duodenal mucosa       were taken. Impression:               - Normal esophagus.                           - Normal stomach (minimally polypoid mucosa                            diffusely) biopsied.                           - Normal duodenal bulb, second portion of the                            duodenum and third portion of the duodenum.                            Biopsied. Subtle finding somewhat suspicious for                            hemobilia or hemoseconds pancreaticus.                           -(History of prior carcinoma including ocular                            melanoma) Moderate Sedation:      Moderate (conscious) sedation was personally administered by an       anesthesia professional. The following parameters were monitored: oxygen       saturation, heart rate, blood pressure, respiratory rate, EKG, adequacy       of pulmonary ventilation, and response to care. Recommendation:           - Patient has a contact number available for                            emergencies. The signs and symptoms of potential                            delayed complications were discussed with the                            patient. Return to normal activities tomorrow.                            Written discharge instructions were provided to the                            patient.                           -  Advance diet as tolerated. Follow-up on                            pathology. Proceed with a pancreatic protocol CT                            scan. See colonoscopy report. Procedure Code(s):        --- Professional ---                           [phone], Esophagogastroduodenoscopy, flexible,                            transoral; diagnostic, including collection of                            specimen(s) by brushing or washing, when performed                             (separate procedure) Diagnosis Code(s):        --- Professional ---                           D50.9, Iron deficiency anemia, unspecified                           K92.1, Melena (includes Hematochezia)                           R93.3, Abnormal findings on diagnostic imaging of                            other parts of digestive tract CPT copyright 2019 American Medical Association. All rights reserved. The codes documented in this report are preliminary and upon coder review may  be revised to meet current compliance requirements. Cristopher Estimable. Kathryn Linarez, MD Norvel Richards, MD 08/22/2021 12:13:17 PM This report has been signed electronically. Number of Addenda: 0

## 2021-08-22 NOTE — Discharge Instructions (Addendum)
Colonoscopy Discharge Instructions  Read the instructions outlined below and refer to this sheet in the next few weeks. These discharge instructions provide you with general information on caring for yourself after you leave the hospital. Your doctor may also give you specific instructions. While your treatment has been planned according to the most current medical practices available, unavoidable complications occasionally occur. If you have any problems or questions after discharge, call Dr. Gala Romney at [phone]. ACTIVITY You may resume your regular activity, but move at a slower pace for the next 24 hours.  Take frequent rest periods for the next 24 hours.  Walking will help get rid of the air and reduce the bloated feeling in your belly (abdomen).  No driving for 24 hours (because of the medicine (anesthesia) used during the test).   Do not sign any important legal documents or operate any machinery for 24 hours (because of the anesthesia used during the test).  NUTRITION Drink plenty of fluids.  You may resume your normal diet as instructed by your doctor.  Begin with a light meal and progress to your normal diet. Heavy or fried foods are harder to digest and may make you feel sick to your stomach (nauseated).  Avoid alcoholic beverages for 24 hours or as instructed.  MEDICATIONS You may resume your normal medications unless your doctor tells you otherwise.  WHAT YOU CAN EXPECT TODAY Some feelings of bloating in the abdomen.  Passage of more gas than usual.  Spotting of blood in your stool or on the toilet paper.  IF YOU HAD POLYPS REMOVED DURING THE COLONOSCOPY: No aspirin products for 7 days or as instructed.  No alcohol for 7 days or as instructed.  Eat a soft diet for the next 24 hours.  FINDING OUT THE RESULTS OF YOUR TEST Not all test results are available during your visit. If your test results are not back during the visit, make an appointment with your caregiver to find out the  results. Do not assume everything is normal if you have not heard from your caregiver or the medical facility. It is important for you to follow up on all of your test results.  SEEK IMMEDIATE MEDICAL ATTENTION IF: You have more than a spotting of blood in your stool.  Your belly is swollen (abdominal distention).  You are nauseated or vomiting.  You have a temperature over 101.  You have abdominal pain or discomfort that is severe or gets worse throughout the day.    Colonoscopy Discharge Instructions  Read the instructions outlined below and refer to this sheet in the next few weeks. These discharge instructions provide you with general information on caring for yourself after you leave the hospital. Your doctor may also give you specific instructions. While your treatment has been planned according to the most current medical practices available, unavoidable complications occasionally occur. If you have any problems or questions after discharge, call Dr. Gala Romney at [phone]. ACTIVITY You may resume your regular activity, but move at a slower pace for the next 24 hours.  Take frequent rest periods for the next 24 hours.  Walking will help get rid of the air and reduce the bloated feeling in your belly (abdomen).  No driving for 24 hours (because of the medicine (anesthesia) used during the test).   Do not sign any important legal documents or operate any machinery for 24 hours (because of the anesthesia used during the test).  NUTRITION Drink plenty of fluids.  You may resume  your normal diet as instructed by your doctor.  Begin with a light meal and progress to your normal diet. Heavy or fried foods are harder to digest and may make you feel sick to your stomach (nauseated).  Avoid alcoholic beverages for 24 hours or as instructed.  MEDICATIONS You may resume your normal medications unless your doctor tells you otherwise.  WHAT YOU CAN EXPECT TODAY Some feelings of bloating in the abdomen.   Passage of more gas than usual.  Spotting of blood in your stool or on the toilet paper.  IF YOU HAD POLYPS REMOVED DURING THE COLONOSCOPY: No aspirin products for 7 days or as instructed.  No alcohol for 7 days or as instructed.  Eat a soft diet for the next 24 hours.  FINDING OUT THE RESULTS OF YOUR TEST Not all test results are available during your visit. If your test results are not back during the visit, make an appointment with your caregiver to find out the results. Do not assume everything is normal if you have not heard from your caregiver or the medical facility. It is important for you to follow up on all of your test results.  SEEK IMMEDIATE MEDICAL ATTENTION IF: You have more than a spotting of blood in your stool.  Your belly is swollen (abdominal distention).  You are nauseated or vomiting.  You have a temperature over 101.  You have abdominal pain or discomfort that is severe or gets worse throughout the day.    Your stomach looked good today.  I did take samples of your stomach and your small intestine  You had a "blood blister" in your colon which could have produced some bleeding.  I treated it and placed a clip on it so it would not bleed in the future  Stop Amitiza.  Resume Linzess 290 (1) gelcap daily; on top of Linzess; take MiraLAX 17 g in 8 ounces water at bedtime-this should work pretty well for your bowels.  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  Refractory IDA./GI bleeding, history of ocular melanoma and cervical cancer).  Further recommendations to follow once I get all test results back for review  No future MRI until clip is gone.  Constipation and hemorrhoid information provided  Office visit with Roseanne Kaufman in 6 to 8 weeks.  At patient request, I called [name] at [phone] -reviewed findings  and recommendations     LEFT MESSAGE ON VOICEMAIL FOR STACY AT DR. Coralee North OFFICE TO CALL PATIENT WITH FOLLOW UP APPOINTMENT AND CT [place]

## 2021-08-22 NOTE — Transfer of Care (Signed)
Immediate Anesthesia Transfer of Care Note  Patient: Kathleen Erickson  Procedure(s) Performed: COLONOSCOPY WITH PROPOFOL ESOPHAGOGASTRODUODENOSCOPY (EGD) WITH PROPOFOL BIOPSY HEMOSTASIS CLIP PLACEMENT HOT HEMOSTASIS (ARGON PLASMA COAGULATION/BICAP)  Patient Location: PACU  Anesthesia Type:General  Level of Consciousness: awake, alert , oriented and patient cooperative  Airway & Oxygen Therapy: Patient Spontanous Breathing  Post-op Assessment: Report given to RN, Post -op Vital signs reviewed and stable and Patient moving all extremities X 4  Post vital signs: Reviewed and stable  Last Vitals:  Vitals Value Taken Time  BP    Temp    Pulse    Resp    SpO2      Last Pain:  Vitals:   08/22/21 1114  TempSrc:   PainSc: 0-No pain      Patients Stated Pain Goal: 10 (73/66/81 [id number])  Complications: No notable events documented.

## 2021-08-22 NOTE — Anesthesia Postprocedure Evaluation (Signed)
Anesthesia Post Note  Patient: Kathleen Erickson  Procedure(s) Performed: COLONOSCOPY WITH PROPOFOL ESOPHAGOGASTRODUODENOSCOPY (EGD) WITH PROPOFOL BIOPSY HEMOSTASIS CLIP PLACEMENT HOT HEMOSTASIS (ARGON PLASMA COAGULATION/BICAP)  Patient location during evaluation: Phase II Anesthesia Type: General Level of consciousness: awake Pain management: pain level controlled Vital Signs Assessment: post-procedure vital signs reviewed and stable Respiratory status: spontaneous breathing and respiratory function stable Cardiovascular status: blood pressure returned to baseline and stable Postop Assessment: no headache and no apparent nausea or vomiting Anesthetic complications: no Comments: Late entry   No notable events documented.   Last Vitals:  Vitals:   08/22/21 0941 08/22/21 1157  BP: 139/66 (!) 105/47  Pulse:  73  Resp: 18 20  Temp: 36.6 C 36.4 C  SpO2: 98% 99%    Last Pain:  Vitals:   08/22/21 1157  TempSrc: Oral  PainSc: 0-No pain                 Louann Sjogren

## 2021-08-22 NOTE — Anesthesia Preprocedure Evaluation (Signed)
Anesthesia Evaluation  Patient identified by MRN, date of birth, ID band Patient awake    Reviewed: Allergy & Precautions, H&P , NPO status , Patient's Chart, lab work & pertinent test results, reviewed documented beta blocker date and time   Airway Mallampati: II  TM Distance: >3 FB Neck ROM: full    Dental no notable dental hx.    Pulmonary COPD, former smoker,    Pulmonary exam normal breath sounds clear to auscultation       Cardiovascular Exercise Tolerance: Good negative cardio ROS   Rhythm:regular Rate:Normal     Neuro/Psych PSYCHIATRIC DISORDERS Anxiety Depression negative neurological ROS     GI/Hepatic Neg liver ROS, PUD, GERD  Medicated,  Endo/Other  Hypothyroidism   Renal/GU negative Renal ROS  negative genitourinary   Musculoskeletal   Abdominal   Peds  Hematology  (+) Blood dyscrasia, anemia ,   Anesthesia Other Findings   Reproductive/Obstetrics negative OB ROS                             Anesthesia Physical Anesthesia Plan  ASA: 2  Anesthesia Plan: General   Post-op Pain Management:    Induction:   PONV Risk Score and Plan: Propofol infusion  Airway Management Planned:   Additional Equipment:   Intra-op Plan:   Post-operative Plan:   Informed Consent: I have reviewed the patients History and Physical, chart, labs and discussed the procedure including the risks, benefits and alternatives for the proposed anesthesia with the patient or authorized representative who has indicated his/her understanding and acceptance.     Dental Advisory Given  Plan Discussed with: CRNA  Anesthesia Plan Comments:         Anesthesia Quick Evaluation

## 2021-08-23 LAB — SURGICAL PATHOLOGY

## 2021-08-24 ENCOUNTER — Encounter (HOSPITAL_COMMUNITY): Payer: Self-pay | Admitting: Internal Medicine

## 2021-08-29 ENCOUNTER — Encounter: Payer: Medicaid Other | Admitting: Nurse Practitioner

## 2021-08-29 NOTE — Progress Notes (Signed)
Pt was made aware and verbalized understanding. Pt is scheduled with Kathleen Erickson in Feb.

## 2021-08-30 ENCOUNTER — Other Ambulatory Visit: Payer: Self-pay

## 2021-08-30 DIAGNOSIS — D509 Iron deficiency anemia, unspecified: Secondary | ICD-10-CM

## 2021-08-30 NOTE — Progress Notes (Unsigned)
c 

## 2021-08-31 ENCOUNTER — Ambulatory Visit (INDEPENDENT_AMBULATORY_CARE_PROVIDER_SITE_OTHER): Payer: Medicaid Other | Admitting: Nurse Practitioner

## 2021-08-31 ENCOUNTER — Encounter: Payer: Self-pay | Admitting: Nurse Practitioner

## 2021-08-31 ENCOUNTER — Other Ambulatory Visit: Payer: Self-pay

## 2021-08-31 VITALS — BP 147/66 | HR 63 | Resp 17 | Ht 61.0 in | Wt 143.1 lb

## 2021-08-31 DIAGNOSIS — E782 Mixed hyperlipidemia: Secondary | ICD-10-CM | POA: Diagnosis not present

## 2021-08-31 DIAGNOSIS — Z0000 Encounter for general adult medical examination without abnormal findings: Secondary | ICD-10-CM

## 2021-08-31 DIAGNOSIS — Z0001 Encounter for general adult medical examination with abnormal findings: Secondary | ICD-10-CM | POA: Insufficient documentation

## 2021-08-31 DIAGNOSIS — Z23 Encounter for immunization: Secondary | ICD-10-CM | POA: Diagnosis not present

## 2021-08-31 MED ORDER — ATORVASTATIN CALCIUM 20 MG PO TABS: 10.0000 mg | ORAL_TABLET | Freq: Every day | ORAL | 0 refills | Status: DC

## 2021-08-31 NOTE — Assessment & Plan Note (Signed)
Flu vaccine given today. 

## 2021-08-31 NOTE — Progress Notes (Signed)
Established Patient Office Visit  Subjective:  Patient ID: Kathleen Erickson, female    DOB: [birth year]  Age: 60 y.o. MRN: [id number]  CC:  Chief Complaint  Patient presents with   Annual Exam    HPI [name] presents for annual exam Past medical and Family history reviewed  She has been having colonoscopy every 2 years due to her having hemorrhoid and constipation, last colonoscopy was this year has 2 polys taken out no cancer.   She has had 2 COVID vaccine, uptodate with pneumonia vaccine, has had 2 shingles vaccine, needs flu vaccine. She is up to date with her mammogram. PT told to bring vaccine card so we can update them in the computer.  She is been followed by hematology for her anemia, and GI for her chronic constipation has an appointment coming up to scan her pancrease.   She had her eye exam this year, the melanoma in her right eye is stable.   Low dose CT for screening for Lung cancer due to smoking history ordered today.   .   Past Medical History:  Diagnosis Date   Allergy    Anemia    thalcemia   Anxiety    Arthritis    Cancer ([place])    Cervical cancer   COPD (chronic obstructive pulmonary disease) (HCC)    Depression    Dyspnea    Family history of breast cancer    Family history of lung cancer    Family history of ovarian cancer    Family history of skin cancer    GERD (gastroesophageal reflux disease)    Hyperlipidemia    Hypothyroidism    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    patient report    Past Surgical History:  Procedure Laterality Date   BILATERAL CARPAL TUNNEL RELEASE Bilateral    at least 10 years ago   BIOPSY  08/22/2021   Procedure: BIOPSY;  Surgeon: Daneil Dolin, MD;  [facility];  Service: Endoscopy;;   carpel tunnel release     bilaterally   CESAREAN SECTION     COLONOSCOPY N/A 06/22/2014   Dr. Rourk:External and internal hemorrhoids-grade 4; otherwise normal rectum total colon and terminal ileum.   COLONOSCOPY WITH  PROPOFOL N/A 11/26/2017   external and internal hemorrhoids, prominent anal papilla, normal rectum and colon, normal TI.    COLONOSCOPY WITH PROPOFOL N/A 08/22/2021   Procedure: COLONOSCOPY WITH PROPOFOL;  Surgeon: Daneil Dolin, MD;  [facility]: [facility];  Service: Endoscopy;  Laterality: N/A;  8:15am, changed to 11:30 per office   COLPOSCOPY     ESOPHAGOGASTRODUODENOSCOPY N/A 06/22/2014   Dr. Gala Romney:The mucosa of the esophagus appeared normal  Single gastric ulcer ranging between 3-5 mm in size was found   Biopsy with negative H.pylori.    ESOPHAGOGASTRODUODENOSCOPY N/A 10/28/2014   Dr. Gala Romney: patulous EG junction, small hiatal hernia, previous noted gastric ulcer healed   ESOPHAGOGASTRODUODENOSCOPY (EGD) WITH PROPOFOL N/A 11/26/2017   normal esophagus, normal stomach, normal duodenum.    ESOPHAGOGASTRODUODENOSCOPY (EGD) WITH PROPOFOL N/A 08/22/2021   Procedure: ESOPHAGOGASTRODUODENOSCOPY (EGD) WITH PROPOFOL;  Surgeon: Daneil Dolin, MD;  [facility];  Service: Endoscopy;  Laterality: N/A;   GIVENS CAPSULE STUDY N/A 02/14/2021   Procedure: GIVENS CAPSULE STUDY;  Surgeon: Daneil Dolin, MD;  [facility];  Service: Endoscopy;  Laterality: N/A;  7:30am   HEMOSTASIS CLIP PLACEMENT  08/22/2021   Procedure: HEMOSTASIS CLIP PLACEMENT;  Surgeon: Daneil Dolin,  MD;  [facility];  Service: Endoscopy;;   HOT HEMOSTASIS  08/22/2021   Procedure: HOT HEMOSTASIS (ARGON PLASMA COAGULATION/BICAP);  Surgeon: Daneil Dolin, MD;  [facility];  Service: Endoscopy;;   VAGINAL HYSTERECTOMY     bleeding; she thinks it was a partial and has ovaries in place    Family History  Problem Relation Age of Onset   Uterine cancer Mother    Heart disease Mother    Alcohol abuse Mother    Arthritis Mother    COPD Mother    Depression Mother    Ovarian cancer Mother        dx 69s   Heart disease Father        age 69, family questions autopsy   Alcohol  abuse Father    Arthritis Father    Depression Father    Early death Father    Cancer Other        ulcer, aunt   Lung cancer Cousin        dx 18s, then again in her 23s (maternal first cousin)   Throat cancer Cousin    Asthma Son    COPD Sister    Lung cancer Maternal Aunt        dx 60s/70s, smoker, ulcer   Cancer Maternal Uncle        dx 72s, unknown primary (affected eye/head, colon, stomach), chewed tobacco   Gout Paternal Uncle    Seizures Paternal Uncle    Lung cancer Maternal Aunt        dx 61s, non smoker   Cancer Maternal Aunt        dx 52s, mouth cancer, chewed tobacco   Skin cancer Maternal Uncle    Skin cancer Maternal Uncle    Breast cancer Niece 2   Cancer Niece        unknown type, dx 21s   Lung cancer Cousin        dx late 36s (maternal first cousin)   Skin cancer Cousin        multiple (maternal first cousin)   Colon cancer Neg Hx    Ulcers Neg Hx     Social History   Socioeconomic History   Marital status: Legally Separated    Spouse name: [name]   Number of children: 2   Years of education: 10   Highest education level: Not on file  Occupational History   Occupation: not working    Comment: cleaned houses    Comment: disabled from arthritis  Tobacco Use   Smoking status: Former    Packs/day: 0.25    Years: 40.00    Pack years: 10.00    Types: Cigarettes    Start date: 09/25/1973    Quit date: 05/17/2020    Years since quitting: 1.2   Smokeless tobacco: Never  Vaping Use   Vaping Use: Never used  Substance and Sexual Activity   Alcohol use: No   Drug use: No   Sexual activity: Yes    Birth control/protection: None  Other Topics Concern   Not on file  Social History Narrative   Lives alone , has family around her.    Social Determinants of Health   Financial Resource Strain: Not on file  Food Insecurity: Not on file  Transportation Needs: Not on file  Physical Activity: Not on file  Stress: Not on file  Social Connections: Not  on file  Intimate Partner Violence: Not on file  Outpatient Medications Prior to Visit  Medication Sig Dispense Refill   bisacodyl (GENTLE LAXATIVE) 5 MG EC tablet Take 5 mg by mouth daily as needed for moderate constipation.     Cholecalciferol (VITAMIN D-3) 125 MCG (5000 UT) TABS Take 10,000 Units by mouth daily.     clobetasol ointment (TEMOVATE) 0.05 % APPLY TO AFFECTED AREA TWICE A DAY (Patient taking differently: 1 application daily.) 30 g 0   desvenlafaxine (PRISTIQ) 50 MG 24 hr tablet Take 1 tablet (50 mg total) by mouth daily. 90 tablet 1   hydrocortisone (ANUSOL-HC) 2.5 % rectal cream Place 1 application rectally 2 (two) times daily. 30 g 1   levothyroxine (SYNTHROID) 50 MCG tablet TAKE 1 TABLET BY MOUTH EVERY DAY (Patient taking differently: Take 50 mcg by mouth daily.) 90 tablet 1   liothyronine (CYTOMEL) 5 MCG tablet Take 2 tablets (10 mcg total) by mouth daily. 180 tablet 1   meclizine (ANTIVERT) 25 MG tablet Take 25 mg by mouth 3 (three) times daily as needed for dizziness.     mometasone (ELOCON) 0.1 % cream Apply 1 application topically daily as needed (dry ear).     Omega-3 Fatty Acids (FISH OIL) 1000 MG CAPS Take 2,000 mg by mouth daily.     omeprazole (PRILOSEC) 40 MG capsule TAKE 1 CAPSULE BY MOUTH ONCE OR TWICE DAILY BEFORE MEALS. 90 capsule 3   PROAIR HFA 108 (90 Base) MCG/ACT inhaler TAKE 2 PUFFS BY MOUTH EVERY 6 HOURS AS NEEDED FOR WHEEZE OR SHORTNESS OF BREATH 8.5 each 3   vitamin B-12 (CYANOCOBALAMIN) 250 MCG tablet Take 250 mcg by mouth daily.     Biotin 10000 MCG TABS Take 1,000 mcg by mouth 3 (three) times a week. (Patient not taking: Reported on 08/31/2021)     estradiol (ESTRACE) 2 MG tablet TAKE 1 TABLET BY MOUTH EVERY DAY (Patient taking differently: Take 2 mg by mouth daily.) 90 tablet 1   No facility-administered medications prior to visit.    Allergies  Allergen Reactions   Venofer [Iron Sucrose] Other (See Comments)    Patient had a hypersensitivity  reaction to Venofer.  See progress note from 04/25/21.   Feraheme [Ferumoxytol] Swelling and Rash   Penicillins Hives    Has patient had a PCN reaction causing immediate rash, facial/tongue/throat swelling, SOB or lightheadedness with hypotension: No Has patient had a PCN reaction causing severe rash involving mucus membranes or skin necrosis: Yes Has patient had a PCN reaction that required hospitalization: No Has patient had a PCN reaction occurring within the last 10 years: No If all of the above answers are "NO", then may proceed with Cephalosporin use.    Morphine And Related Itching          ROS     Objective:    Physical Exam Constitutional:      General: She is not in acute distress.    Appearance: Normal appearance. She is normal weight. She is not ill-appearing, toxic-appearing or diaphoretic.  HENT:     Head: Normocephalic and atraumatic.     Right Ear: Tympanic membrane, ear canal and external ear normal. There is no impacted cerumen.     Left Ear: Tympanic membrane, ear canal and external ear normal. There is no impacted cerumen.     Nose: No congestion or rhinorrhea.     Mouth/Throat:     Mouth: Mucous membranes are moist.     Pharynx: Oropharynx is clear. No oropharyngeal exudate or posterior oropharyngeal erythema.  Eyes:  General: No scleral icterus.       Right eye: No discharge.        Left eye: No discharge.     Extraocular Movements: Extraocular movements intact.     Conjunctiva/sclera: Conjunctivae normal.  Neck:     Vascular: No carotid bruit.  Cardiovascular:     Rate and Rhythm: Normal rate and regular rhythm.     Pulses: Normal pulses.     Heart sounds: Normal heart sounds. No murmur heard.   No friction rub. No gallop.  Pulmonary:     Effort: No respiratory distress.     Breath sounds: No stridor. No wheezing, rhonchi or rales.  Chest:     Chest wall: No tenderness.  Abdominal:     General: Abdomen is flat. There is no distension.      Palpations: Abdomen is soft. There is no mass.     Tenderness: There is no abdominal tenderness. There is no right CVA tenderness, left CVA tenderness, guarding or rebound.     Hernia: No hernia is present.  Musculoskeletal:        General: No swelling, tenderness, deformity or signs of injury. Normal range of motion.     Cervical back: Normal range of motion and neck supple. No rigidity or tenderness.     Right lower leg: No edema.     Left lower leg: No edema.  Lymphadenopathy:     Cervical: No cervical adenopathy.  Skin:    Capillary Refill: Capillary refill takes less than 2 seconds.     Coloration: Skin is not jaundiced or pale.     Findings: Rash present. No bruising, erythema or lesion.     Comments: Rash on the medial aspect of right upper arm   Neurological:     General: No focal deficit present.     Mental Status: She is alert and oriented to person, place, and time.     Cranial Nerves: No cranial nerve deficit.     Sensory: No sensory deficit.     Motor: No weakness.     Coordination: Coordination normal.     Gait: Gait normal.  Psychiatric:        Mood and Affect: Mood normal.        Behavior: Behavior normal.        Thought Content: Thought content normal.        Judgment: Judgment normal.    BP (!) 147/66   Pulse 63   Resp 17   Ht 5' 1" (1.549 m)   Wt 143 lb 1.9 oz (64.9 kg)   SpO2 96%   BMI 27.04 kg/m  Wt Readings from Last 3 Encounters:  08/31/21 143 lb 1.9 oz (64.9 kg)  08/22/21 142 lb (64.4 kg)  07/25/21 143 lb 6.4 oz (65 kg)     Health Maintenance Due  Topic Date Due   Zoster Vaccines- Shingrix (1 of 2) Never done   COVID-19 Vaccine (3 - Moderna risk series) 07/23/2020   INFLUENZA VACCINE  04/25/2021   Pneumococcal Vaccine 19-33 Years old (2 - PCV) 07/06/2021    There are no preventive care reminders to display for this patient.  Lab Results  Component Value Date   TSH 5.730 (H) 07/11/2021   Lab Results  Component Value Date   WBC 5.4  07/11/2021   HGB 10.6 (L) 07/11/2021   HCT 35.9 (L) 07/11/2021   MCV 67.1 (L) 07/11/2021   PLT 277 07/11/2021   Lab Results  Component Value  Date   NA 138 07/11/2021   K 4.3 07/11/2021   CO2 23 07/11/2021   GLUCOSE 86 07/11/2021   BUN 12 07/11/2021   CREATININE 0.65 07/11/2021   BILITOT 0.3 07/11/2021   ALKPHOS 71 07/11/2021   AST 19 07/11/2021   ALT 14 07/11/2021   PROT 6.6 07/11/2021   ALBUMIN 4.5 07/11/2021   CALCIUM 9.1 07/11/2021   ANIONGAP 9 02/11/2020   EGFR 100 07/11/2021   Lab Results  Component Value Date   CHOL 249 (H) 07/11/2021   Lab Results  Component Value Date   HDL 57 07/11/2021   Lab Results  Component Value Date   LDLCALC 152 (H) 07/11/2021   Lab Results  Component Value Date   TRIG 218 (H) 07/11/2021   Lab Results  Component Value Date   CHOLHDL 3.7 07/17/2017   No results found for: HGBA1C    Assessment & Plan:   Problem List Items Addressed This Visit   None Visit Diagnoses     Annual physical exam    -  Primary       No orders of the defined types were placed in this encounter.   Follow-up: No follow-ups on file.    Renee Rival, FNP

## 2021-08-31 NOTE — Assessment & Plan Note (Signed)
Start atorvastatin 20mg  daily Continue fish oil.Eat a healthy diet, including lots of fruits and vegetables.  Avoid foods with a lot of saturated and trans fats, such as red meat, butter, fried foods and cheese . Maintain a healthy weight.

## 2021-08-31 NOTE — Assessment & Plan Note (Signed)
Annual exam as documented.  Counseling done include healthy lifestyle involving committing to 150 minutes of exercise per week, heart healthy diet, and attaining healthy weight. The importance of adequate sleep also discussed.  Regular use of seat belt and home safety were also discussed . Changes in health habits are decided on by patient with goals and time frames set for achieving them. Immunization and cancer screening  needs are specifically addressed at this visit.   Change smoke detector when due No use of phone when driving Have fire extinguisher at home.  Keep firearm locked in a secure cabinet away from children.

## 2021-08-31 NOTE — Patient Instructions (Signed)
Take atorvastatin 20 mg daily for your cholesterol.  Please get your fasting labs done 3-5 days before your next visit Flu vaccine today  Eat a healthy diet, including lots of fruits and vegetables. Avoid foods with a lot of saturated and trans fats, such as red meat, butter, fried foods and cheese . Maintain a healthy weight.    It is important that you exercise regularly at least 30 minutes 5 times a week.  Think about what you will eat, plan ahead. Choose " clean, green, fresh or frozen" over canned, processed or packaged foods which are more sugary, salty and fatty. 70 to 75% of food eaten should be vegetables and fruit. Three meals at set times with snacks allowed between meals, but they must be fruit or vegetables. Aim to eat over a 12 hour period , example 7 am to 7 pm, and STOP after  your last meal of the day. Drink water,generally about 64 ounces per day, no other drink is as healthy. Fruit juice is best enjoyed in a healthy way, by EATING the fruit.  Thanks for choosing [facility], we consider it a privelige to serve you.

## 2021-09-09 ENCOUNTER — Telehealth: Payer: Self-pay

## 2021-09-09 NOTE — Telephone Encounter (Signed)
PA for CT abd w/wo contrast submitted via AIM website. Order ID: [id number], valid 09/09/21-11/07/21.

## 2021-09-14 ENCOUNTER — Other Ambulatory Visit: Payer: Self-pay | Admitting: Nurse Practitioner

## 2021-09-14 DIAGNOSIS — E782 Mixed hyperlipidemia: Secondary | ICD-10-CM

## 2021-09-19 ENCOUNTER — Ambulatory Visit
Admission: EM | Admit: 2021-09-19 | Discharge: 2021-09-19 | Disposition: A | Discharge status: Home / Self Care | Payer: Medicaid Other | Attending: Family Medicine | Admitting: Family Medicine

## 2021-09-19 ENCOUNTER — Other Ambulatory Visit: Payer: Self-pay

## 2021-09-19 DIAGNOSIS — Z1152 Encounter for screening for COVID-19: Secondary | ICD-10-CM | POA: Diagnosis not present

## 2021-09-19 DIAGNOSIS — Z20828 Contact with and (suspected) exposure to other viral communicable diseases: Secondary | ICD-10-CM | POA: Diagnosis not present

## 2021-09-19 DIAGNOSIS — J069 Acute upper respiratory infection, unspecified: Secondary | ICD-10-CM

## 2021-09-19 MED ORDER — OSELTAMIVIR PHOSPHATE 75 MG PO CAPS: 75.0000 mg | ORAL_CAPSULE | Freq: Two times a day (BID) | ORAL | 0 refills | Status: DC

## 2021-09-19 MED ORDER — PROMETHAZINE-DM 6.25-15 MG/5ML PO SYRP: 5.0000 mL | ORAL_SOLUTION | Freq: Four times a day (QID) | ORAL | 0 refills | Status: DC | PRN

## 2021-09-19 NOTE — ED Provider Notes (Signed)
[facility]    CSN: [id number] Arrival date & time: 09/19/21  1107      History   Chief Complaint Chief Complaint  Patient presents with   Cough   Fever    HPI Kathleen Erickson is a 60 y.o. female.   Presenting today with 2 to 3-day history of cough, congestion, fever, chills.  Denies chest pain, shortness of breath, abdominal pain, nausea vomiting or diarrhea.  Taking over-the-counter cold and congestion medications with mild temporary relief of symptoms.  Multiple sick contacts with the flu recently.  No known pertinent chronic medical problems.   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[place])    patient report    Patient Active Problem List   Diagnosis Date Noted   Annual physical exam 08/31/2021   Need for immunization against influenza 08/31/2021   Menopause 06/13/2021   Vitamin D deficiency 06/13/2021   Genetic testing 03/02/2021   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[place])    Family history of ovarian cancer    Family history of breast cancer    Family history of lung cancer    Family history of skin cancer    IDA (iron deficiency anemia) 01/21/2021   Constipation 10/16/2017   Thalassemia 07/17/2017   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   Cigarette nicotine dependence without complication 62/37/6283   Anxiety 12/28/2015   Depression 12/28/2015   Adjustment disorder with mixed anxiety and depressed mood 11/07/2015   PUD (peptic ulcer disease) 07/23/2014   GERD (gastroesophageal reflux disease) 05/29/2014   Hyperlipidemia 01/20/2014   Hypothyroidism 01/20/2014     Past Surgical History:  Procedure Laterality Date   BILATERAL CARPAL TUNNEL RELEASE Bilateral    at least 10 years ago   BIOPSY  08/22/2021   Procedure: BIOPSY;  Surgeon: Daneil Dolin, MD;  [facility];  Service: Endoscopy;;   carpel tunnel release     bilaterally   CESAREAN SECTION     COLONOSCOPY N/A 06/22/2014   Dr. Rourk:External and internal hemorrhoids-grade 4; otherwise normal rectum total colon and terminal ileum.   COLONOSCOPY WITH PROPOFOL N/A 11/26/2017   external and internal hemorrhoids, prominent anal papilla, normal rectum and colon, normal TI.    COLONOSCOPY WITH PROPOFOL N/A 08/22/2021   Procedure: COLONOSCOPY WITH PROPOFOL;  Surgeon: Daneil Dolin, MD;  [facility];  Service: Endoscopy;  Laterality: N/A;  8:15am, changed to 11:30 per office   COLPOSCOPY     ESOPHAGOGASTRODUODENOSCOPY N/A 06/22/2014   Dr. Gala Romney:The mucosa of the esophagus appeared normal  Single gastric ulcer ranging between 3-5 mm in size was found   Biopsy with negative H.pylori.    ESOPHAGOGASTRODUODENOSCOPY N/A 10/28/2014   Dr. Gala Romney: patulous EG junction, small hiatal hernia, previous noted gastric ulcer healed   ESOPHAGOGASTRODUODENOSCOPY (EGD) WITH PROPOFOL N/A 11/26/2017   normal esophagus, normal stomach, normal duodenum.    ESOPHAGOGASTRODUODENOSCOPY (EGD) WITH PROPOFOL N/A 08/22/2021   Procedure: ESOPHAGOGASTRODUODENOSCOPY (EGD) WITH PROPOFOL;  Surgeon: Daneil Dolin, MD;  [facility];  Service: Endoscopy;  Laterality: N/A;   GIVENS CAPSULE STUDY N/A 02/14/2021   Procedure: GIVENS CAPSULE STUDY;  Surgeon: Daneil Dolin, MD;  [facility];  Service: Endoscopy;  Laterality: N/A;  7:30am   HEMOSTASIS CLIP PLACEMENT  08/22/2021   Procedure: HEMOSTASIS CLIP PLACEMENT;  Surgeon: Daneil Dolin, MD;  [facility];  Service: Endoscopy;;   HOT HEMOSTASIS  08/22/2021   Procedure: HOT HEMOSTASIS (ARGON PLASMA COAGULATION/BICAP);   Surgeon: Daneil Dolin, MD;  [facility];  Service: Endoscopy;;   VAGINAL HYSTERECTOMY     bleeding; she thinks it was a partial and has ovaries in place    OB History     Gravida      Para      Term      Preterm      AB      Living  2      SAB      IAB      Ectopic      Multiple      Live Births               Home Medications    Prior to Admission medications   Medication Sig Start Date End Date Taking? Authorizing Provider  oseltamivir (TAMIFLU) 75 MG capsule Take 1 capsule (75 mg total) by mouth every 12 (twelve) hours. 09/19/21  Yes Volney American, PA-C  promethazine-dextromethorphan (PROMETHAZINE-DM) 6.25-15 MG/5ML syrup Take 5 mLs by mouth 4 (four) times daily as needed. 09/19/21  Yes Volney American, PA-C  atorvastatin (LIPITOR) 20 MG tablet TAKE 1/2 TABLET BY MOUTH DAILY 09/14/21   Renee Rival, FNP  Biotin 10000 MCG TABS Take 1,000 mcg by mouth 3 (three) times a week. Patient not taking: Reported on 08/31/2021    [provider]  bisacodyl (GENTLE LAXATIVE) 5 MG EC tablet Take 5 mg by mouth daily as needed for moderate constipation.    [provider]  Cholecalciferol (VITAMIN D-3) 125 MCG (5000 UT) TABS Take 10,000 Units by mouth daily.    [provider]  clobetasol ointment (TEMOVATE) 0.05 % APPLY TO AFFECTED AREA TWICE A DAY Patient taking differently: 1 application daily. 07/14/21   Fayrene Helper, MD  desvenlafaxine (PRISTIQ) 50 MG 24 hr tablet Take 1 tablet (50 mg total) by mouth daily. 02/08/21   Doree Albee, MD  estradiol (ESTRACE) 2 MG tablet TAKE 1 TABLET BY MOUTH EVERY DAY Patient taking differently: Take 2 mg by mouth daily. 02/20/21 08/15/21  Doree Albee, MD  hydrocortisone (ANUSOL-HC) 2.5 % rectal cream Place 1 application rectally 2 (two) times daily. 06/30/21   Annitta Needs, NP  levothyroxine (SYNTHROID) 50 MCG tablet TAKE 1 TABLET BY MOUTH EVERY DAY Patient  taking differently: Take 50 mcg by mouth daily. 02/20/21   Doree Albee, MD  liothyronine (CYTOMEL) 5 MCG tablet Take 2 tablets (10 mcg total) by mouth daily. 11/18/20   Doree Albee, MD  meclizine (ANTIVERT) 25 MG tablet Take 25 mg by mouth 3 (three) times daily as needed for dizziness.    [provider]  mometasone (ELOCON) 0.1 % cream Apply 1 application topically daily as needed (dry ear).    [provider]  Omega-3 Fatty Acids (FISH OIL) 1000 MG CAPS Take 2,000 mg by mouth daily.    [provider]  omeprazole (PRILOSEC) 40 MG capsule TAKE 1  CAPSULE BY MOUTH ONCE OR TWICE DAILY BEFORE MEALS. 05/05/21   Erenest Rasher, PA-C  PROAIR HFA 108 (90 Base) MCG/ACT inhaler TAKE 2 PUFFS BY MOUTH EVERY 6 HOURS AS NEEDED FOR WHEEZE OR SHORTNESS OF BREATH 11/30/20   Doree Albee, MD  vitamin B-12 (CYANOCOBALAMIN) 250 MCG tablet Take 250 mcg by mouth daily.    [provider]    Family History Family History  Problem Relation Age of Onset   Uterine cancer Mother    Heart disease Mother    Alcohol abuse Mother    Arthritis Mother    COPD Mother    Depression Mother    Ovarian cancer Mother        dx 33s   Heart disease Father        age 22, family questions autopsy   Alcohol abuse Father    Arthritis Father    Depression Father    Early death Father    Cancer Other        ulcer, aunt   Lung cancer Cousin        dx 59s, then again in her 70s (maternal first cousin)   Throat cancer Cousin    Asthma Son    COPD Sister    Lung cancer Maternal Aunt        dx 60s/70s, smoker, ulcer   Cancer Maternal Uncle        dx 39s, unknown primary (affected eye/head, colon, stomach), chewed tobacco   Gout Paternal Uncle    Seizures Paternal Uncle    Lung cancer Maternal Aunt        dx 17s, non smoker   Cancer Maternal Aunt        dx 37s, mouth cancer, chewed tobacco   Skin cancer Maternal Uncle    Skin cancer Maternal Uncle    Breast cancer Niece  60   Cancer Niece        unknown type, dx 70s   Lung cancer Cousin        dx late 90s (maternal first cousin)   Skin cancer Cousin        multiple (maternal first cousin)   Colon cancer Neg Hx    Ulcers Neg Hx     Social History Social History   Tobacco Use   Smoking status: Former    Packs/day: 0.25    Years: 40.00    Pack years: 10.00    Types: Cigarettes    Start date: 09/25/1973    Quit date: 05/17/2020    Years since quitting: 1.3   Smokeless tobacco: Never  Vaping Use   Vaping Use: Never used  Substance Use Topics   Alcohol use: No   Drug use: No     Allergies   Venofer [iron sucrose], Feraheme [ferumoxytol], Penicillins, and Morphine and related   Review of Systems Review of Systems Per HPI  Physical Exam Triage Vital Signs ED Triage Vitals  Enc Vitals Group     BP 09/19/21 1425 110/76     Pulse Rate 09/19/21 1425 73     Resp 09/19/21 1425 20     Temp 09/19/21 1425 98.1 F (36.7 C)     Temp src --      SpO2 09/19/21 1425 97 %     Weight --      Height --      Head Circumference --      Peak Flow --      Pain Score 09/19/21 1423  6     Pain Loc --      Pain Edu? --      Excl. in [place]? --    No data found.  Updated Vital Signs BP 110/76    Pulse 73    Temp 98.1 F (36.7 C)    Resp 20    SpO2 97%   Visual Acuity Right Eye Distance:   Left Eye Distance:   Bilateral Distance:    Right Eye Near:   Left Eye Near:    Bilateral Near:     Physical Exam Vitals and nursing note reviewed.  Constitutional:      Appearance: Normal appearance.  HENT:     Head: Atraumatic.     Right Ear: Tympanic membrane and external ear normal.     Left Ear: Tympanic membrane and external ear normal.     Nose: Rhinorrhea present.     Mouth/Throat:     Mouth: Mucous membranes are moist.     Pharynx: Posterior oropharyngeal erythema present.  Eyes:     Extraocular Movements: Extraocular movements intact.     Conjunctiva/sclera: Conjunctivae normal.   Cardiovascular:     Rate and Rhythm: Normal rate and regular rhythm.     Heart sounds: Normal heart sounds.  Pulmonary:     Effort: Pulmonary effort is normal.     Breath sounds: Normal breath sounds. No wheezing or rales.  Musculoskeletal:        General: Normal range of motion.     Cervical back: Normal range of motion and neck supple.  Skin:    General: Skin is warm and dry.  Neurological:     Mental Status: She is alert and oriented to person, place, and time.  Psychiatric:        Mood and Affect: Mood normal.        Thought Content: Thought content normal.     UC Treatments / Results  Labs (all labs ordered are listed, but only abnormal results are displayed) Labs Reviewed  COVID-19, FLU A+B NAA    EKG   Radiology No results found.  Procedures Procedures (including critical care time)  Medications Ordered in UC Medications - No data to display  Initial Impression / Assessment and Plan / UC Course  I have reviewed the triage vital signs and the nursing notes.  Pertinent labs & imaging results that were available during my care of the patient were reviewed by me and considered in my medical decision making (see chart for details).     Vital signs benign and reassuring, will treat proactively with Tamiflu while awaiting COVID and flu results given symptoms and exposures.  Phenergan DM sent for cough.  Discussed over-the-counter supportive medications and home care.  Return for acutely worsening symptoms.  Final Clinical Impressions(s) / UC Diagnoses   Final diagnoses:  Viral URI with cough  Exposure to influenza   Discharge Instructions   None    ED Prescriptions     Medication Sig Dispense Auth. Provider   oseltamivir (TAMIFLU) 75 MG capsule Take 1 capsule (75 mg total) by mouth every 12 (twelve) hours. 10 capsule Volney American, [place]   promethazine-dextromethorphan (PROMETHAZINE-DM) 6.25-15 MG/5ML syrup Take 5 mLs by mouth 4 (four) times  daily as needed. 100 mL Volney American, [place]      PDMP not reviewed this encounter.   Volney American, [place] 09/19/21 [phone]

## 2021-09-19 NOTE — ED Triage Notes (Signed)
Pt presents with c/o cough and fever that began on friday

## 2021-09-20 ENCOUNTER — Telehealth: Payer: Self-pay | Admitting: Nurse Practitioner

## 2021-09-20 ENCOUNTER — Other Ambulatory Visit: Payer: Self-pay | Admitting: *Deleted

## 2021-09-20 MED ORDER — LEVOTHYROXINE SODIUM 50 MCG PO TABS
50.0000 ug | ORAL_TABLET | Freq: Every day | ORAL | 1 refills | Status: DC
Start: 2021-09-20 — End: 2021-12-28

## 2021-09-20 NOTE — Telephone Encounter (Signed)
Pt called in regard to   levothyroxine (SYNTHROID) 50 MCG tablet  Pt wanted to verify if she still needs to take the said med , if so pt will need refill    Pt needs refill on   atorvastatin (LIPITOR) 20 MG tablet   [facility] [address]

## 2021-09-20 NOTE — Telephone Encounter (Signed)
Atorvastatin had been sent by fola to pharmacy refilled levothyroxine

## 2021-09-21 LAB — COVID-19, FLU A+B NAA
Influenza A, NAA: DETECTED — AB
Influenza B, NAA: NOT DETECTED
SARS-CoV-2, NAA: NOT DETECTED

## 2021-10-01 ENCOUNTER — Other Ambulatory Visit: Payer: Self-pay | Admitting: Gastroenterology

## 2021-10-06 ENCOUNTER — Ambulatory Visit (HOSPITAL_COMMUNITY): Admission: RE | Admit: 2021-10-06 | Payer: Medicaid Other | Source: Ambulatory Visit

## 2021-10-06 ENCOUNTER — Ambulatory Visit (HOSPITAL_COMMUNITY)
Admission: RE | Admit: 2021-10-06 | Discharge: 2021-10-06 | Disposition: A | Discharge status: Home / Self Care | Payer: Medicaid Other | Source: Ambulatory Visit | Attending: Gastroenterology | Admitting: Gastroenterology

## 2021-10-06 ENCOUNTER — Other Ambulatory Visit: Payer: Self-pay

## 2021-10-06 DIAGNOSIS — K921 Melena: Secondary | ICD-10-CM | POA: Diagnosis not present

## 2021-10-06 DIAGNOSIS — D509 Iron deficiency anemia, unspecified: Secondary | ICD-10-CM | POA: Insufficient documentation

## 2021-10-06 LAB — POCT I-STAT CREATININE: Creatinine, Ser: 0.7 mg/dL (ref 0.44–1.00)

## 2021-10-06 MED ORDER — IOHEXOL 300 MG/ML  SOLN
100.0000 mL | Freq: Once | INTRAMUSCULAR | Status: AC | PRN
  Administered 2021-10-06: 100 mL via INTRAVENOUS

## 2021-10-25 ENCOUNTER — Other Ambulatory Visit: Payer: Self-pay

## 2021-10-25 ENCOUNTER — Ambulatory Visit: Payer: Medicaid Other | Admitting: Internal Medicine

## 2021-10-25 ENCOUNTER — Encounter: Payer: Self-pay | Admitting: Internal Medicine

## 2021-10-25 ENCOUNTER — Ambulatory Visit (INDEPENDENT_AMBULATORY_CARE_PROVIDER_SITE_OTHER): Payer: Medicaid Other | Admitting: *Deleted

## 2021-10-25 DIAGNOSIS — J069 Acute upper respiratory infection, unspecified: Secondary | ICD-10-CM

## 2021-10-25 LAB — POCT INFLUENZA A/B
Influenza A, POC: NEGATIVE
Influenza B, POC: NEGATIVE

## 2021-10-25 LAB — POCT RAPID STREP A (OFFICE): Rapid Strep A Screen: NEGATIVE

## 2021-10-25 NOTE — Progress Notes (Signed)
Virtual Visit via Telephone Note   This visit type was conducted due to national recommendations for restrictions regarding the COVID-19 Pandemic (e.g. social distancing) in an effort to limit this patient's exposure and mitigate transmission in our community.  Due to her co-morbid illnesses, this patient is at least at moderate risk for complications without adequate follow up.  This format is felt to be most appropriate for this patient at this time.  The patient did not have access to video technology/had technical difficulties with video requiring transitioning to audio format only (telephone).  All issues noted in this document were discussed and addressed.  No physical exam could be performed with this format.  Evaluation Performed:  Follow-up visit  Date:  05/26/5175   ID:  Kathleen Erickson, [employer] [birth year], MRN [id number]  Patient Location: Home Provider Location: Office/Clinic  Participants: Patient Location of Patient: Home Location of Provider: Telehealth Consent was obtain for visit to be over via telehealth. I verified that I am speaking with the correct person using two identifiers.  PCP:  Renee Rival, FNP   Chief Complaint: Sore throat, nasal congestion and headache  History of Present Illness:    Kathleen Erickson is a 61 y.o. female who has a televisit for complaint of sore throat, nasal congestion, headache and myalgias since this morning.  Her home COVID test was negative today.  She reports that her grand daughters tested positive for strep and other family member had flu test positive yesterday.  Of note, patient herself was treated with Tamiflu for possible flu about a month ago.  She currently denies any fever, chills, dyspnea or wheezing.  The patient does have symptoms concerning for COVID-19 infection (fever, chills, cough, or new shortness of breath).   Past Medical, Surgical, Social History, Allergies, and Medications have been Reviewed.  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 Rourk:External and internal hemorrhoids-grade 4; otherwise normal rectum total colon and terminal ileum.   COLONOSCOPY WITH PROPOFOL N/A 11/26/2017   external and internal hemorrhoids, prominent anal papilla, normal rectum and colon, normal TI.    COLONOSCOPY WITH PROPOFOL N/A 08/22/2021   Procedure: COLONOSCOPY WITH PROPOFOL;  Surgeon: Daneil Dolin, MD;  [facility];  Service: Endoscopy;  Laterality: N/A;  8:15am, changed to 11:30 per office   COLPOSCOPY     ESOPHAGOGASTRODUODENOSCOPY N/A 06/22/2014   Dr. Gala Romney:The mucosa of the esophagus appeared normal  Single gastric ulcer ranging between 3-5 mm in size  was found   Biopsy with negative H.pylori.    ESOPHAGOGASTRODUODENOSCOPY N/A 10/28/2014   Dr. Gala Romney: patulous EG junction, small hiatal hernia, previous noted gastric ulcer healed   ESOPHAGOGASTRODUODENOSCOPY (EGD) WITH PROPOFOL N/A 11/26/2017   normal esophagus, normal stomach, normal duodenum.    ESOPHAGOGASTRODUODENOSCOPY (EGD) WITH PROPOFOL N/A 08/22/2021   Procedure: ESOPHAGOGASTRODUODENOSCOPY (EGD) WITH PROPOFOL;  Surgeon: Daneil Dolin, MD;  [facility];   Service: Endoscopy;  Laterality: N/A;   GIVENS CAPSULE STUDY N/A 02/14/2021   Procedure: GIVENS CAPSULE STUDY;  Surgeon: Daneil Dolin, MD;  [facility];  Service: Endoscopy;  Laterality: N/A;  7:30am   HEMOSTASIS CLIP PLACEMENT  08/22/2021   Procedure: HEMOSTASIS CLIP PLACEMENT;  Surgeon: Daneil Dolin, MD;  [facility];  Service: Endoscopy;;   HOT HEMOSTASIS  08/22/2021   Procedure: HOT HEMOSTASIS (ARGON PLASMA COAGULATION/BICAP);  Surgeon: Daneil Dolin, MD;  [facility];  Service: Endoscopy;;   VAGINAL HYSTERECTOMY     bleeding; she thinks it was a partial and has ovaries in place     Current Meds  Medication Sig   atorvastatin (LIPITOR) 20 MG tablet TAKE 1/2 TABLET BY MOUTH DAILY   Biotin 10000 MCG TABS Take 1,000 mcg by mouth 3 (three) times a week.   bisacodyl (GENTLE LAXATIVE) 5 MG EC tablet Take 5 mg by mouth daily as needed for moderate constipation.   Cholecalciferol (VITAMIN D-3) 125 MCG (5000 UT) TABS Take 10,000 Units by mouth daily.   clobetasol ointment (TEMOVATE) 0.05 % APPLY TO AFFECTED AREA TWICE A DAY (Patient taking differently: 1 application daily.)   desvenlafaxine (PRISTIQ) 50 MG 24 hr tablet Take 1 tablet (50 mg total) by mouth daily.   hydrocortisone (ANUSOL-HC) 2.5 % rectal cream Place 1 application rectally 2 (two) times daily.   levothyroxine (SYNTHROID) 50 MCG tablet Take 1 tablet (50 mcg total) by mouth daily.   liothyronine (CYTOMEL) 5 MCG tablet Take 2 tablets (10 mcg total) by mouth daily.   meclizine (ANTIVERT) 25 MG tablet Take 25 mg by mouth 3 (three) times daily as needed for dizziness.   mometasone (ELOCON) 0.1 % cream Apply 1 application topically daily as needed (dry ear).   Omega-3 Fatty Acids (FISH OIL) 1000 MG CAPS Take 2,000 mg by mouth daily.   omeprazole (PRILOSEC) 40 MG capsule TAKE 1 CAPSULE BY MOUTH ONCE OR TWICE DAILY BEFORE MEALS.   oseltamivir (TAMIFLU) 75 MG capsule Take 1 capsule (75 mg total)  by mouth every 12 (twelve) hours.   PROAIR HFA 108 (90 Base) MCG/ACT inhaler TAKE 2 PUFFS BY MOUTH EVERY 6 HOURS AS NEEDED FOR WHEEZE OR SHORTNESS OF BREATH   promethazine-dextromethorphan (PROMETHAZINE-DM) 6.25-15 MG/5ML syrup Take 5 mLs by mouth 4 (four) times daily as needed.   vitamin B-12 (CYANOCOBALAMIN) 250 MCG tablet Take 250 mcg by mouth daily.     Allergies:   Venofer [iron sucrose], Feraheme [ferumoxytol], Penicillins, and Morphine and related   ROS:   Please see the history of present illness.     All other systems reviewed and are negative.   Labs/Other Tests and Data Reviewed:    Recent Labs: 07/11/2021: ALT 14; BUN 12; Hemoglobin 10.6; Platelets 277; Potassium 4.3; Sodium 138; TSH 5.730 10/06/2021: Creatinine, Ser 0.70   Recent Lipid Panel Lab Results  Component Value Date/Time   CHOL 249 (H) 07/11/2021 08:07 AM   TRIG 218 (H) 07/11/2021 08:07 AM   HDL 57 07/11/2021 08:07 AM  CHOLHDL 3.7 07/17/2017 03:39 PM   LDLCALC 152 (H) 07/11/2021 08:07 AM   LDLCALC 123 (H) 07/17/2017 03:39 PM    Wt Readings from Last 3 Encounters:  08/31/21 143 lb 1.9 oz (64.9 kg)  08/22/21 142 lb (64.4 kg)  07/25/21 143 lb 6.4 oz (65 kg)     ASSESSMENT & PLAN:    URTI Home COVID test negative Recent exposure to flu and strep Check rapid flu and strep test Advised to take Mucinex or Robitussin as needed for cough If negative flu test with persistent symptoms, will start antibiotic  Time:   Today, I have spent 9 minutes reviewing the chart, including problem list, medications, and with the patient with telehealth technology discussing the above problems.   Medication Adjustments/Labs and Tests Ordered: Current medicines are reviewed at length with the patient today.  Concerns regarding medicines are outlined above.   Tests Ordered: No orders of the defined types were placed in this encounter.   Medication Changes: No orders of the defined types were placed in this  encounter.    Note: This dictation was prepared with Dragon dictation along with smaller phrase technology. Similar sounding words can be transcribed inadequately or may not be corrected upon review. Any transcriptional errors that result from this process are unintentional.      Disposition:  Follow up  Signed, Lindell Spar, MD  10/25/2021 11:57 AM     [place]

## 2021-10-26 ENCOUNTER — Telehealth: Payer: Self-pay

## 2021-10-26 ENCOUNTER — Telehealth: Payer: Self-pay | Admitting: Nurse Practitioner

## 2021-10-26 NOTE — Telephone Encounter (Signed)
Pt notified with verbal understanding  °

## 2021-10-26 NOTE — Telephone Encounter (Signed)
Patient calling never heard back from the nurse about swab yesterday. Was told call her back within 20 minutes of her swab.

## 2021-10-26 NOTE — Telephone Encounter (Signed)
Pt LVM in regard to swab results

## 2021-10-27 ENCOUNTER — Other Ambulatory Visit: Payer: Self-pay | Admitting: Internal Medicine

## 2021-10-27 DIAGNOSIS — J069 Acute upper respiratory infection, unspecified: Secondary | ICD-10-CM

## 2021-10-27 LAB — SARS-COV-2, NAA 2 DAY TAT

## 2021-10-27 LAB — NOVEL CORONAVIRUS, NAA: SARS-CoV-2, NAA: NOT DETECTED

## 2021-10-27 MED ORDER — AZITHROMYCIN 250 MG PO TABS: ORAL_TABLET | ORAL | 0 refills | Status: AC

## 2021-10-27 NOTE — Telephone Encounter (Signed)
Spoke with pt about results

## 2021-11-09 ENCOUNTER — Other Ambulatory Visit: Payer: Self-pay | Admitting: Nurse Practitioner

## 2021-11-09 DIAGNOSIS — E782 Mixed hyperlipidemia: Secondary | ICD-10-CM

## 2021-11-22 ENCOUNTER — Ambulatory Visit: Payer: Medicaid Other | Admitting: Gastroenterology

## 2021-12-02 ENCOUNTER — Ambulatory Visit: Payer: Medicaid Other | Admitting: Nurse Practitioner

## 2021-12-02 ENCOUNTER — Encounter: Payer: Self-pay | Admitting: Nurse Practitioner

## 2021-12-02 ENCOUNTER — Other Ambulatory Visit: Payer: Self-pay

## 2021-12-02 VITALS — BP 118/72 | HR 76 | Ht 61.0 in | Wt 143.0 lb

## 2021-12-02 DIAGNOSIS — E039 Hypothyroidism, unspecified: Secondary | ICD-10-CM | POA: Diagnosis not present

## 2021-12-02 DIAGNOSIS — R7302 Impaired glucose tolerance (oral): Secondary | ICD-10-CM | POA: Diagnosis not present

## 2021-12-02 DIAGNOSIS — E559 Vitamin D deficiency, unspecified: Secondary | ICD-10-CM | POA: Diagnosis not present

## 2021-12-02 DIAGNOSIS — F32A Depression, unspecified: Secondary | ICD-10-CM

## 2021-12-02 DIAGNOSIS — E782 Mixed hyperlipidemia: Secondary | ICD-10-CM | POA: Diagnosis not present

## 2021-12-02 NOTE — Progress Notes (Signed)
? ?  Kathleen Erickson     MRN: [id number]      DOB: Jul 01, 1961 ? ? ?HPI ?Kathleen Erickson with medical history of GERD, hypothyroidism, hyperlipidemia, vitamin D deficiency, depression is here for follow up and re-evaluation of chronic medical conditions,  ? ?Patient states that she has been taking all medications as prescribed, patient denies any adverse reactions to current medications, denies any complaints today. ? ?Pt stses that she is uptodate with shingles and covid vaccines ? ?ROS ?Denies recent fever or chills. ?Denies sinus pressure, nasal congestion, ear pain or sore throat. ?Denies chest congestion, productive cough or wheezing. ?Denies chest pains, palpitations and leg swelling ?Denies abdominal pain, nausea, vomiting,diarrhea or constipation.   ?Denies dysuria, frequency, hesitancy or incontinence. ?Denies joint pain, swelling and limitation in mobility. ?Denies headaches, seizures, numbness, or tingling. ?Denies depression, anxiety or insomnia. ?Denies skin break down or rash. ? ? ?PE ? ?BP 118/72 (BP Location: Right Arm, Cuff Size: Normal)   Pulse 76   Ht '5\' 1"'$  (1.549 m)   Wt 143 lb (64.9 kg)   SpO2 94%   BMI 27.02 kg/m?  ? ?Patient alert and oriented and in no cardiopulmonary distress. ? ?Chest: Clear to auscultation bilaterally. ? ?CVS: S1, S2 no murmurs, no S3.Regular rate. ? ?ABD: Soft non tender.  ? ?Ext: No edema ? ?MS: Adequate ROM spine, shoulders, hips and knees. ? ?Skin: Intact, no ulcerations or rash noted. ? ?Psych: Good eye contact, normal affect. Memory intact not anxious or depressed appearing. ? ? ? ? ?Assessment & Plan ? ?Hypothyroidism ?Takes levothyroxine 50 mcg tablets daily ?TSH T4 ordered today. ?Lab Results  ?Component Value Date  ? TSH 5.730 (H) 07/11/2021  ? ? ? ?Vitamin D deficiency ?Takes vitamin D 10,000 units daily. ?Check vitamin D levels today ? ?Hyperlipidemia ?Takes atorvastatin 10 mg daily, fish oil 2000 mg daily. ?Check lipid panel today ?Avoid fatty fried  foods ? ?Impaired glucose tolerance in nonobese ?Screening for type 2 diabetes ?Check A1c ? ?Depression ?Chronic condition well-controlled ?Takes Pristiq 50 mg daily. ?PHQ-9 score 0 ?Denies SI, HI ? ?

## 2021-12-02 NOTE — Assessment & Plan Note (Signed)
Screening for type 2 diabetes ?Check A1c ?

## 2021-12-02 NOTE — Assessment & Plan Note (Signed)
Takes atorvastatin 10 mg daily, fish oil 2000 mg daily. ?Check lipid panel today ?Avoid fatty fried foods ?

## 2021-12-02 NOTE — Assessment & Plan Note (Signed)
Chronic condition well-controlled ?Takes Pristiq 50 mg daily. ?PHQ-9 score 0 ?Denies SI, HI ?

## 2021-12-02 NOTE — Assessment & Plan Note (Signed)
Takes vitamin D 10,000 units daily. ?Check vitamin D levels today ?

## 2021-12-02 NOTE — Patient Instructions (Signed)
Please get your fasting labs done as discussed ? ?It is important that you exercise regularly at least 30 minutes 5 times a week.  ?Think about what you will eat, plan ahead. ?Choose " clean, green, fresh or frozen" over canned, processed or packaged foods which are more sugary, salty and fatty. ?70 to 75% of food eaten should be vegetables and fruit. ?Three meals at set times with snacks allowed between meals, but they must be fruit or vegetables. ?Aim to eat over a 12 hour period , example 7 am to 7 pm, and STOP after  your last meal of the day. ?Drink water,generally about 64 ounces per day, no other drink is as healthy. Fruit juice is best enjoyed in a healthy way, by EATING the fruit. ? ?Thanks for choosing [facility], we consider it a privelige to serve you. ? ?

## 2021-12-02 NOTE — Assessment & Plan Note (Signed)
Takes levothyroxine 50 mcg tablets daily ?TSH T4 ordered today. ?Lab Results  ?Component Value Date  ? TSH 5.730 (H) 07/11/2021  ? ? ?

## 2021-12-05 ENCOUNTER — Other Ambulatory Visit: Payer: Self-pay | Admitting: Nurse Practitioner

## 2021-12-05 DIAGNOSIS — E782 Mixed hyperlipidemia: Secondary | ICD-10-CM

## 2021-12-07 ENCOUNTER — Telehealth: Payer: Self-pay | Admitting: *Deleted

## 2021-12-07 ENCOUNTER — Encounter: Payer: Self-pay | Admitting: Gastroenterology

## 2021-12-07 ENCOUNTER — Telehealth (INDEPENDENT_AMBULATORY_CARE_PROVIDER_SITE_OTHER): Payer: Medicaid Other | Admitting: Gastroenterology

## 2021-12-07 ENCOUNTER — Other Ambulatory Visit: Payer: Self-pay

## 2021-12-07 VITALS — Ht 61.0 in | Wt 140.0 lb

## 2021-12-07 DIAGNOSIS — K219 Gastro-esophageal reflux disease without esophagitis: Secondary | ICD-10-CM

## 2021-12-07 DIAGNOSIS — K643 Fourth degree hemorrhoids: Secondary | ICD-10-CM

## 2021-12-07 DIAGNOSIS — D509 Iron deficiency anemia, unspecified: Secondary | ICD-10-CM | POA: Diagnosis not present

## 2021-12-07 DIAGNOSIS — K5909 Other constipation: Secondary | ICD-10-CM | POA: Diagnosis not present

## 2021-12-07 NOTE — Progress Notes (Signed)
? ?Referring Provider:  ?Primary Care Physician:  Renee Rival, FNP  ?Primary GI: Dr. Gala Romney ? ?Patient Location: Home ?  ?Provider Location: [place] office ?  ?Reason for Visit: follow up post procedure, IDA, GERD, Constipation ?  ?Persons present on the virtual encounter, with roles: Venetia Night, NP and patient - Kathleen Erickson ?  ?Total time (minutes) spent on medical discussion: 15 minutes ? ?Virtual Visit ? ?I connected with Kathleen Erickson on [birth year] at  4:00 PM EDT by video visit and verified that I am speaking with the correct person using two identifiers. ?  ?I discussed the limitations, risks, security and privacy concerns of performing an evaluation and management service by video and the availability of in person appointments. I also discussed with the patient that there may be a patient responsible charge related to this service. The patient expressed understanding and agreed to proceed. ? ?Chief Complaint  ?Patient presents with  ? Follow-up  ? ? ? ?History of Present Illness: ?The patient is a 61 year old female with history of anemia, arthritis, COPD, depression, GERD, hyperlipidemia, hypothyroidism, chronic constipation, and hemorrhoids who presents for follow-up of GERD, IDA, constipation, and hemorrhoids post procedure. ? ?Last EGD 08/22/2021-normal esophagus, normal stomach (minimal polypoid mucosa), normal duodenal bulb, subtle finding somewhat suspicious for hemobilia or pancreaticus. Recommended follow-up with CT scan. Pathology revealed reactive gastropathy and negative for H. pylori.   ? 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  Recommended repeat in 10 years. ? ?CT A/P with and without contrast performed on 10/17/2021 with no acute abnormalities or suspicious masses identified, normal pancreas, liver,  gallbladder. ? ? ?Today: ? ?IDA: She reports she continues to follow with hematology.  Was receiving iron infusions in the past, however she had a severe anaphylactic reaction to one of her infusions.  She attempted to have subsequent iron infusions with pretreatment however had continued swelling that took a long time to improve.  It appears that her last infusion was August 2022.  Last hemoglobin on file from October 2022 was 10.6, MCV 67.1, ferritin 137, iron 84, TIBC 347, sat 24.  ? ?Still following with them - not getting iron infusions - had a reaction to it, LOC, low BP, anaphylactic reaction (got the code cart) took 2 weeks for the swelling - (had 2 more infusions and had swelling) received pre treatment. ? ?GERD: No NSAIDs, tylenol on occasion, denies dysphagia, good with controlled diet , omeprazole 40 mg BID, once or twice a week.  ? ?Denies melena or hematochezia, no abdominal pain, N/V.  ? ?Constipation:  stool loosened up but still irregular, going every other day (does not feel relief), always feels pressure. still using the dulcolax but had cut back. On 290 linzess, bisacodyl had cut back.  ? ?Hemorrhoids - has lots of pressure, Anusol not really helping with the swelling. External; hemorrhoids. Not frequently bleeding, very itchy, using the cream once to twice per day. Can get them back in.  Soon as she starts walking they come out again.   ? ? ?Past Medical History:  ?Diagnosis Date  ? Allergy   ? Anemia   ? thalcemia  ? Anxiety   ? Arthritis   ? Cancer [facility])   ? Cervical cancer  ? COPD (chronic obstructive pulmonary disease) ([place])   ? Depression   ? Dyspnea   ?  Family history of breast cancer   ? Family history of lung cancer   ? Family history of ovarian cancer   ? Family history of skin cancer   ? GERD (gastroesophageal reflux disease)   ? Hyperlipidemia   ? Hypothyroidism   ? 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 patient report  ? ? ? ?Past Surgical History:  ?Procedure Laterality Date  ? BILATERAL CARPAL  TUNNEL RELEASE Bilateral   ? at least 10 years ago  ? BIOPSY  08/22/2021  ? Procedure: BIOPSY;  Surgeon: Daneil Dolin, MD;  [facility];  Service: Endoscopy;;  ? carpel tunnel release    ? bilaterally  ? CESAREAN SECTION    ? COLONOSCOPY N/A 06/22/2014  ? Dr. Rourk:External and internal hemorrhoids-grade 4; otherwise normal rectum total colon and terminal ileum.  ? COLONOSCOPY WITH PROPOFOL N/A 11/26/2017  ? external and internal hemorrhoids, prominent anal papilla, normal rectum and colon, normal TI.   ? COLONOSCOPY WITH PROPOFOL N/A 08/22/2021  ? 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  ? COLPOSCOPY    ? ESOPHAGOGASTRODUODENOSCOPY N/A 06/22/2014  ? Dr. Gala Romney:The mucosa of the esophagus appeared normal  Single gastric ulcer ranging between 3-5 mm in size was found   Biopsy with negative H.pylori.   ? ESOPHAGOGASTRODUODENOSCOPY N/A 10/28/2014  ? Dr. Gala Romney: patulous EG junction, small hiatal hernia, previous noted gastric ulcer healed  ? ESOPHAGOGASTRODUODENOSCOPY (EGD) WITH PROPOFOL N/A 11/26/2017  ? normal esophagus, normal stomach, normal duodenum.   ? ESOPHAGOGASTRODUODENOSCOPY (EGD) WITH PROPOFOL N/A 08/22/2021  ? normal esophagus, normal stomach (minimal polypoid mucosa), normal duodenal bulb, subtle finding somewhat suspicious for hemobilia or pancreaticus. Pathology revealed reactive gastropathy and negative for H. pylori.  ? GIVENS CAPSULE STUDY N/A 02/14/2021  ? Procedure: GIVENS CAPSULE STUDY;  Surgeon: Daneil Dolin, MD;  [facility];  Service: Endoscopy;  Laterality: N/A;  7:30am  ? HEMOSTASIS CLIP PLACEMENT  08/22/2021  ? Procedure: HEMOSTASIS CLIP PLACEMENT;  Surgeon: Daneil Dolin, MD;  [facility];  Service: Endoscopy;;  ? HOT HEMOSTASIS  08/22/2021  ? Procedure: HOT HEMOSTASIS (ARGON PLASMA  COAGULATION/BICAP);  Surgeon: Daneil Dolin, MD;  [facility];  Service: Endoscopy;;  ? VAGINAL HYSTERECTOMY    ? bleeding; she thinks it was a partial and has ovaries in place  ? ? ? ?Current Meds  ?Medication Sig  ? atorvastatin (LIPITOR) 20 MG tablet TAKE 1/2 TABLET BY MOUTH EVERY DAY  ? Biotin 10000 MCG TABS Take 1,000 mcg by mouth 3 (three) times a week.  ? Cholecalciferol (VITAMIN D-3) 125 MCG (5000 UT) TABS Take 10,000 Units by mouth daily.  ? clobetasol ointment (TEMOVATE) 0.05 % APPLY TO AFFECTED AREA TWICE A DAY (Patient taking differently: 1 application. daily.)  ? desvenlafaxine (PRISTIQ) 50 MG 24 hr tablet Take 1 tablet (50 mg total) by mouth daily.  ? estradiol (ESTRACE) 2 MG tablet TAKE 1 TABLET BY MOUTH EVERY DAY (Patient taking differently: Take 2 mg by mouth daily.)  ? hydrocortisone (ANUSOL-HC) 2.5 % rectal cream Place 1 application rectally 2 (two) times daily.  ? levothyroxine (SYNTHROID) 50 MCG tablet Take 1 tablet (50 mcg total) by mouth daily.  ? LINZESS 290 MCG CAPS capsule Take 290 mcg by mouth daily.  ? meclizine (ANTIVERT) 25 MG tablet Take 25 mg by mouth 3 (  three) times daily as needed for dizziness.  ? mometasone (ELOCON) 0.1 % cream Apply 1 application topically daily as needed (dry ear).  ? Omega-3 Fatty Acids (FISH OIL) 1000 MG CAPS Take 2,000 mg by mouth daily.  ? omeprazole (PRILOSEC) 40 MG capsule TAKE 1 CAPSULE BY MOUTH ONCE OR TWICE DAILY BEFORE MEALS.  ? PROAIR HFA 108 (90 Base) MCG/ACT inhaler TAKE 2 PUFFS BY MOUTH EVERY 6 HOURS AS NEEDED FOR WHEEZE OR SHORTNESS OF BREATH  ? vitamin B-12 (CYANOCOBALAMIN) 250 MCG tablet Take 250 mcg by mouth daily.  ?  ? ?Family History  ?Problem Relation Age of Onset  ? Uterine cancer Mother   ? Heart disease Mother   ? Alcohol abuse Mother   ? Arthritis Mother   ? COPD Mother   ? Depression Mother   ? Ovarian cancer Mother   ?     dx 21s  ? Heart disease Father   ?     age 55, family questions autopsy  ? Alcohol abuse Father   ?  Arthritis Father   ? Depression Father   ? Early death Father   ? Cancer Other   ?     ulcer, aunt  ? Lung cancer Cousin   ?     dx 78s, then again in her 28s (maternal first cousin)  ? Throat cancer Cousin   ? Asthma Son

## 2021-12-07 NOTE — Telephone Encounter (Signed)
Pt consented to a virtual visit. 

## 2021-12-07 NOTE — Telephone Encounter (Signed)
Kathleen Erickson, you are scheduled for a virtual visit with your provider today.  Just as we do with appointments in the office, we must obtain your consent to participate.  Your consent will be active for this visit and any virtual visit you may have with one of our providers in the next 365 days.  If you have a MyChart account, I can also send a copy of this consent to you electronically.  All virtual visits are billed to your insurance company just like a traditional visit in the office.  As this is a virtual visit, video technology does not allow for your provider to perform a traditional examination.  This may limit your provider's ability to fully assess your condition.  If your provider identifies any concerns that need to be evaluated in person or the need to arrange testing such as labs, EKG, etc, we will make arrangements to do so.  Although advances in technology are sophisticated, we cannot ensure that it will always work on either your end or our end.  If the connection with a video visit is poor, we may have to switch to a telephone visit.  With either a video or telephone visit, we are not always able to ensure that we have a secure connection.   I need to obtain your verbal consent now.   Are you willing to proceed with your visit today?  ?

## 2021-12-07 NOTE — Patient Instructions (Signed)
It was a pleasure to meet you today. ? ?I recommend for you to continue omeprazole 40 mg twice daily, Linzess 290 mcg daily. ? ?As discussed you can continue bisacodyl 5 mg daily as needed for constipation. ? ?If you begin to see larger amounts of bright red blood or dark tarry stools with associated fatigue and weakness please follow-up in the clinic. ? ?I recommend for you to incorporate Benefiber (2 teaspoons daily) and MiraLAX (1 capful daily) to aid in more frequent bowel movements and complete emptying.  If you begin to have more than 2 loose watery bowel movements a day you can back off the MiraLAX.  Please follow-up and let me know how you are doing after continuing the bisacodyl and implementing other recommendations. ? ?Follow-up in the clinic in 6 months or sooner as needed. ? ?It was a pleasure to see you today. I want to create trusting relationships with patients. If you receive a survey regarding your visit,  I greatly appreciate you taking time to fill this out on paper or through your MyChart. I value your feedback. ? ?Venetia Night, MSN, FNP-BC, AGACNP-BC ?[facility] ?  ?

## 2021-12-27 DIAGNOSIS — E559 Vitamin D deficiency, unspecified: Secondary | ICD-10-CM | POA: Diagnosis not present

## 2021-12-27 DIAGNOSIS — E039 Hypothyroidism, unspecified: Secondary | ICD-10-CM | POA: Diagnosis not present

## 2021-12-27 DIAGNOSIS — R7302 Impaired glucose tolerance (oral): Secondary | ICD-10-CM | POA: Diagnosis not present

## 2021-12-27 DIAGNOSIS — E782 Mixed hyperlipidemia: Secondary | ICD-10-CM | POA: Diagnosis not present

## 2021-12-28 ENCOUNTER — Other Ambulatory Visit: Payer: Self-pay | Admitting: Nurse Practitioner

## 2021-12-28 LAB — CMP14+EGFR
ALT: 16 IU/L (ref 0–32)
AST: 18 IU/L (ref 0–40)
Albumin/Globulin Ratio: 2.4 — ABNORMAL HIGH (ref 1.2–2.2)
Albumin: 4.3 g/dL (ref 3.8–4.8)
Alkaline Phosphatase: 53 IU/L (ref 44–121)
BUN/Creatinine Ratio: 28 (ref 12–28)
BUN: 18 mg/dL (ref 8–27)
Bilirubin Total: 0.4 mg/dL (ref 0.0–1.2)
CO2: 25 mmol/L (ref 20–29)
Calcium: 9 mg/dL (ref 8.7–10.3)
Chloride: 101 mmol/L (ref 96–106)
Creatinine, Ser: 0.65 mg/dL (ref 0.57–1.00)
Globulin, Total: 1.8 g/dL (ref 1.5–4.5)
Glucose: 90 mg/dL (ref 70–99)
Potassium: 4.3 mmol/L (ref 3.5–5.2)
Sodium: 138 mmol/L (ref 134–144)
Total Protein: 6.1 g/dL (ref 6.0–8.5)
eGFR: 99 mL/min/{1.73_m2} (ref >59)

## 2021-12-28 LAB — CBC WITH DIFFERENTIAL/PLATELET
Basophils Absolute: 0 10*3/uL (ref 0.0–0.2)
Basos: 1 %
EOS (ABSOLUTE): 0.1 10*3/uL (ref 0.0–0.4)
Eos: 3 %
Hematocrit: 34 % (ref 34.0–46.6)
Hemoglobin: 10.2 g/dL — ABNORMAL LOW (ref 11.1–15.9)
Immature Grans (Abs): 0 10*3/uL (ref 0.0–0.1)
Immature Granulocytes: 0 %
Lymphocytes Absolute: 1.3 10*3/uL (ref 0.7–3.1)
Lymphs: 31 %
MCH: 19.9 pg — ABNORMAL LOW (ref 26.6–33.0)
MCHC: 30 g/dL — ABNORMAL LOW (ref 31.5–35.7)
MCV: 66 fL — ABNORMAL LOW (ref 79–97)
Monocytes Absolute: 0.4 10*3/uL (ref 0.1–0.9)
Monocytes: 9 %
NRBC: 1 % — ABNORMAL HIGH (ref 0–0)
Neutrophils Absolute: 2.4 10*3/uL (ref 1.4–7.0)
Neutrophils: 56 %
Platelets: 253 10*3/uL (ref 150–450)
RBC: 5.13 x10E6/uL (ref 3.77–5.28)
RDW: 15.8 % — ABNORMAL HIGH (ref 11.7–15.4)
WBC: 4.2 10*3/uL (ref 3.4–10.8)

## 2021-12-28 LAB — TSH+FREE T4
Free T4: 1.18 ng/dL (ref 0.82–1.77)
TSH: 7.35 u[IU]/mL — ABNORMAL HIGH (ref 0.450–4.500)

## 2021-12-28 LAB — HEMOGLOBIN A1C
Est. average glucose Bld gHb Est-mCnc: 103 mg/dL
Hgb A1c MFr Bld: 5.2 % (ref 4.8–5.6)

## 2021-12-28 LAB — VITAMIN D 25 HYDROXY (VIT D DEFICIENCY, FRACTURES): Vit D, 25-Hydroxy: 59.7 ng/mL (ref 30.0–100.0)

## 2021-12-28 MED ORDER — LEVOTHYROXINE SODIUM 75 MCG PO TABS: 75.0000 ug | ORAL_TABLET | Freq: Every day | ORAL | 2 refills | Status: DC

## 2021-12-28 NOTE — Progress Notes (Signed)
TSH remains elevated, starts levothyroxine 75 mcg daily stop taking levothyroxine 50 mcg daily patient to take medication on empty stomach 30 minutes to 1 hour before meals.  Please schedule all 5 weeks follow-up to recheck thyroid function, patient should have labs done 3 to 5 days before next visit. ? ?A1c is normal vitamin D level is normal, normal kidney function ?Follow up with GI for work up for her anemia

## 2022-01-05 ENCOUNTER — Other Ambulatory Visit: Payer: Self-pay | Admitting: Gastroenterology

## 2022-01-05 ENCOUNTER — Other Ambulatory Visit: Payer: Self-pay | Admitting: Nurse Practitioner

## 2022-01-05 NOTE — Telephone Encounter (Signed)
LAST OV 12/07/21 VIA VIDEO ?

## 2022-01-30 ENCOUNTER — Inpatient Hospital Stay (HOSPITAL_COMMUNITY): Payer: Medicaid Other | Attending: Hematology

## 2022-02-06 ENCOUNTER — Ambulatory Visit (HOSPITAL_COMMUNITY): Payer: Medicaid Other | Admitting: Physician Assistant

## 2022-02-14 ENCOUNTER — Other Ambulatory Visit: Payer: Self-pay | Admitting: Nurse Practitioner

## 2022-02-17 ENCOUNTER — Telehealth: Payer: Self-pay

## 2022-02-17 ENCOUNTER — Other Ambulatory Visit: Payer: Self-pay | Admitting: Nurse Practitioner

## 2022-02-17 DIAGNOSIS — E782 Mixed hyperlipidemia: Secondary | ICD-10-CM

## 2022-02-17 MED ORDER — ATORVASTATIN CALCIUM 20 MG PO TABS: 10.0000 mg | ORAL_TABLET | Freq: Every day | ORAL | 0 refills | Status: DC

## 2022-02-17 MED ORDER — DESVENLAFAXINE SUCCINATE ER 50 MG PO TB24: 50.0000 mg | ORAL_TABLET | Freq: Every day | ORAL | 1 refills | Status: DC

## 2022-02-17 NOTE — Telephone Encounter (Signed)
Patient called said the pharmacy has sent 3 prescription over to the provider and has not gotten any response. Patient is out of her medicine please send in to [facility]. Called patient back and left voicemail to call office with the 3 medicine she was needing.

## 2022-02-17 NOTE — Telephone Encounter (Signed)
Please advise these were prescribed by old provider

## 2022-02-17 NOTE — Telephone Encounter (Signed)
Patient returned call and needs refill on:  estradiol (ESTRACE) 2 MG tablet needs 90 day supply  desvenlafaxine (PRISTIQ) 50 MG 24 hr tablet  90 day supply   Not sure to continue this one or not atorvastatin (LIPITOR) 20 MG tablet  1/2 tablet daily.  [facility]

## 2022-02-21 NOTE — Telephone Encounter (Signed)
Spoke with pt advised of Fola's message. Pt doesn't remember what she is taking the estradol for and does not want a referral placed at this time

## 2022-04-07 ENCOUNTER — Encounter: Payer: Self-pay | Admitting: Nurse Practitioner

## 2022-04-07 ENCOUNTER — Ambulatory Visit: Payer: Medicaid Other | Admitting: Nurse Practitioner

## 2022-04-07 VITALS — BP 124/70 | HR 71 | Ht 61.0 in | Wt 141.0 lb

## 2022-04-07 DIAGNOSIS — Z1231 Encounter for screening mammogram for malignant neoplasm of breast: Secondary | ICD-10-CM | POA: Diagnosis not present

## 2022-04-07 DIAGNOSIS — L989 Disorder of the skin and subcutaneous tissue, unspecified: Secondary | ICD-10-CM | POA: Diagnosis not present

## 2022-04-07 DIAGNOSIS — E039 Hypothyroidism, unspecified: Secondary | ICD-10-CM

## 2022-04-07 DIAGNOSIS — E782 Mixed hyperlipidemia: Secondary | ICD-10-CM

## 2022-04-07 DIAGNOSIS — K219 Gastro-esophageal reflux disease without esophagitis: Secondary | ICD-10-CM | POA: Diagnosis not present

## 2022-04-07 DIAGNOSIS — F324 Major depressive disorder, single episode, in partial remission: Secondary | ICD-10-CM | POA: Diagnosis not present

## 2022-04-07 MED ORDER — OMEPRAZOLE 40 MG PO CPDR: DELAYED_RELEASE_CAPSULE | ORAL | 1 refills | Status: DC

## 2022-04-07 NOTE — Progress Notes (Signed)
   Kathleen Erickson     MRN: [id number]      DOB: [birth year]   HPI Kathleen Erickson with past medical history of acquired hypothyroidism, GERD, PUD, hyperlipidemia, vitamin D deficiency is here for follow up for hypothyroidism   Hypothyroidism.  Currently taking levothyroxine 75 mg daily.  Has chronic constipation no complaints of fatigue, insomnia   Has 2 spots on her right leg and thigh that has been there for years .she stated that the one on her right leg is getting darker .she denies itching, swelling, redness,drainage has family history of skin cancer and would like a dermatologist to check the spots.    Up-to-date with shingles vaccine we get records from the pharmacy  ROS Denies recent fever or chills. Denies sinus pressure, nasal congestion, ear pain or sore throat. Denies chest congestion, productive cough or wheezing. Denies chest pains, palpitations and leg swelling Denies abdominal pain, nausea, vomiting,diarrhea or constipation.   Denies dysuria, frequency, hesitancy or incontinence. Denies joint pain, swelling and limitation in mobility. Denies headaches, seizures, numbness, or tingling. Denies depression, anxiety or insomnia.   PE  BP 124/70 (BP Location: Right Arm, Patient Position: Sitting, Cuff Size: Normal)   Pulse 71   Ht '5\' 1"'$  (1.549 m)   Wt 141 lb (64 kg)   SpO2 94%   BMI 26.64 kg/m   Patient alert and oriented and in no cardiopulmonary distress.  Chest: Clear to auscultation bilaterally.  CVS: S1, S2 no murmurs, no S3.Regular rate.  ABD: Soft non tender.   Ext: No edema  MS: Adequate ROM spine, shoulders, hips and knees.  Skin: has two small 2 patches on her right leg , the one on the lower leg appears darker in color , no drainage, redness, ulcer noted.   Psych: Good eye contact, normal affect. Memory intact not anxious or depressed appearing.  CNS: CN 2-12 intact, power,  normal throughout.no focal deficits noted.   Assessment & Plan GERD  (gastroesophageal reflux disease) Followed by GI,  Takes omeprazole 40 mg twice daily Medication refilled Patient encouraged to maintain close follow-up with GI Avoid fatty fried foods, spicy foods, alcohol, chocolates  Hyperlipidemia On atorvastatin 10 mg daily Lipid panel ordered Avoid fried fatty foods Lab Results  Component Value Date   CHOL 249 (H) 07/11/2021   HDL 57 07/11/2021   LDLCALC 152 (H) 07/11/2021   TRIG 218 (H) 07/11/2021   CHOLHDL 3.7 07/17/2017    Hypothyroidism Lab Results  Component Value Date   TSH 7.350 (H) 12/27/2021  Currently on levothyroxine 75 mcg daily Check TSH T4  Skin lesion of right lower extremity Patient referred to dermatologist to rule out skin cancer since she has family history of skin cancer  Depression Chronic condition well controlled  Continue Pristiq 50 mg daily

## 2022-04-07 NOTE — Assessment & Plan Note (Signed)
Lab Results  Component Value Date   TSH 7.350 (H) 12/27/2021  Currently on levothyroxine 75 mcg daily Check TSH T4

## 2022-04-07 NOTE — Assessment & Plan Note (Signed)
Patient referred to dermatologist to rule out skin cancer since she has family history of skin cancer

## 2022-04-07 NOTE — Patient Instructions (Signed)

## 2022-04-07 NOTE — Assessment & Plan Note (Signed)
Chronic condition well controlled  Continue Pristiq 50 mg daily

## 2022-04-07 NOTE — Assessment & Plan Note (Signed)
On atorvastatin 10 mg daily Lipid panel ordered Avoid fried fatty foods Lab Results  Component Value Date   CHOL 249 (H) 07/11/2021   HDL 57 07/11/2021   LDLCALC 152 (H) 07/11/2021   TRIG 218 (H) 07/11/2021   CHOLHDL 3.7 07/17/2017

## 2022-04-07 NOTE — Assessment & Plan Note (Addendum)
Followed by GI,  Takes omeprazole 40 mg twice daily Medication refilled Patient encouraged to maintain close follow-up with GI Avoid fatty fried foods, spicy foods, alcohol, chocolates

## 2022-04-10 DIAGNOSIS — E782 Mixed hyperlipidemia: Secondary | ICD-10-CM | POA: Diagnosis not present

## 2022-04-10 DIAGNOSIS — E039 Hypothyroidism, unspecified: Secondary | ICD-10-CM | POA: Diagnosis not present

## 2022-04-11 LAB — LIPID PANEL
Chol/HDL Ratio: 3 ratio (ref 0.0–4.4)
Cholesterol, Total: 173 mg/dL (ref 100–199)
HDL: 57 mg/dL (ref >39)
LDL Chol Calc (NIH): 105 mg/dL — ABNORMAL HIGH (ref 0–99)
Triglycerides: 54 mg/dL (ref 0–149)
VLDL Cholesterol Cal: 11 mg/dL (ref 5–40)

## 2022-04-11 LAB — TSH+FREE T4
Free T4: 1.23 ng/dL (ref 0.82–1.77)
TSH: 0.834 u[IU]/mL (ref 0.450–4.500)

## 2022-04-11 NOTE — Progress Notes (Signed)
Labs are stable , continue current medications   The 10-year ASCVD risk score (Arnett DK, et al., 2019) is: 3.4%   Values used to calculate the score:     Age: 61 years     Sex: Female     Is Non-Hispanic African American: No     Diabetic: No     Tobacco smoker: No     Systolic Blood Pressure: 886 mmHg     Is BP treated: No     HDL Cholesterol: 57 mg/dL     Total Cholesterol: 173 mg/dL

## 2022-04-28 ENCOUNTER — Other Ambulatory Visit: Payer: Self-pay | Admitting: Nurse Practitioner

## 2022-05-16 ENCOUNTER — Ambulatory Visit: Payer: Medicaid Other | Admitting: Family Medicine

## 2022-05-16 ENCOUNTER — Encounter: Payer: Self-pay | Admitting: Family Medicine

## 2022-05-16 VITALS — BP 126/73 | HR 63 | Ht 61.0 in | Wt 142.0 lb

## 2022-05-16 DIAGNOSIS — Z780 Asymptomatic menopausal state: Secondary | ICD-10-CM | POA: Diagnosis not present

## 2022-05-16 NOTE — Patient Instructions (Signed)
I appreciate the opportunity to provide care to you today!    Follow up:  with Fola  Please continue taking Pristiq 50 mg for hot flashes   Please continue to a heart-healthy diet and increase your physical activities. Try to exercise for 58mns at least three times a week.      It was a pleasure to see you and I look forward to continuing to work together on your health and well-being. Please do not hesitate to call the office if you need care or have questions about your care.   Have a wonderful day and week. With Gratitude, GAlvira MondayMSN, FNP-BC

## 2022-05-16 NOTE — Progress Notes (Signed)
paxil  Acute Office Visit  Subjective:     Patient ID: Kathleen Erickson, female    DOB: 1961-04-25, 61 y.o.   MRN: [id number]  Chief Complaint  Patient presents with   Menopause    Pt c/o hot flashes onset of this has been ongoing for 3-4 months, at her previous PCP she was receiving estradiol to help manage this, she would like a refill of this if possible.     HPI Patient is in today for refill of her estradiol. She has been out of medication for 3 months but reports taking treatment for 1-2 years. She has a significan hx of cancer in her family including breast cancer, lung cancer, prostate cancer, and cervical cancer. She reports hx of partial hysterectomy. She was referred to GYN but reported not wanting to see multiple providers as her previous provider refilled her medications. She is currently taking pristiq '50mg'$  daily.  Review of Systems  Constitutional:  Negative for chills.  Respiratory:  Negative for shortness of breath.   Cardiovascular:  Negative for palpitations and leg swelling.  Gastrointestinal:  Negative for abdominal pain.  Psychiatric/Behavioral:  Negative for depression and memory loss.         Objective:    BP 126/73   Pulse 63   Ht '5\' 1"'$  (1.549 m)   Wt 142 lb (64.4 kg)   SpO2 96%   BMI 26.83 kg/m    Physical Exam HENT:     Head: Normocephalic.  Cardiovascular:     Rate and Rhythm: Normal rate and regular rhythm.     Pulses: Normal pulses.     Heart sounds: Normal heart sounds.  Pulmonary:     Effort: Pulmonary effort is normal.     Breath sounds: Normal breath sounds.  Abdominal:     Palpations: Abdomen is soft.  Neurological:     Mental Status: She is alert.     No results found for any visits on 05/16/22.      Assessment & Plan:   Problem List Items Addressed This Visit       Other   Menopause - Primary    Advised pt that long-term use of estradiol can increase the risk of breast cancer Given her family hx of cancer, I  recommend not taking estradiol and considering nonhormonal therapy Informed patient to continue taking Pristiq as prescribed by her PCP         No orders of the defined types were placed in this encounter.   Return if symptoms worsen or fail to improve.  Alvira Monday, FNP

## 2022-05-16 NOTE — Assessment & Plan Note (Signed)
Advised pt that long-term use of estradiol can increase the risk of breast cancer Given her family hx of cancer, I recommend not taking estradiol and considering nonhormonal therapy Informed patient to continue taking Pristiq as prescribed by her PCP

## 2022-06-01 ENCOUNTER — Telehealth: Payer: Self-pay | Admitting: Nurse Practitioner

## 2022-06-01 NOTE — Telephone Encounter (Signed)
..  Patient declines further follow up and engagement by the Managed Medicaid Team. Appropriate care team members and provider have been notified via electronic communication. The Managed Medicaid Team is available to follow up with the patient after provider conversation with the patient regarding recommendation for engagement and subsequent re-referral to the Managed Medicaid Team.    Jennifer Alley Care Guide, High Risk Medicaid Managed Care Embedded Care Coordination [place]  [facility]   

## 2022-06-12 ENCOUNTER — Encounter: Payer: Self-pay | Admitting: *Deleted

## 2022-06-26 ENCOUNTER — Other Ambulatory Visit: Payer: Self-pay | Admitting: Nurse Practitioner

## 2022-06-26 DIAGNOSIS — E782 Mixed hyperlipidemia: Secondary | ICD-10-CM

## 2022-08-12 ENCOUNTER — Other Ambulatory Visit: Payer: Self-pay | Admitting: Gastroenterology

## 2022-08-24 ENCOUNTER — Other Ambulatory Visit: Payer: Self-pay | Admitting: Internal Medicine

## 2022-10-13 ENCOUNTER — Encounter: Payer: Self-pay | Admitting: Family Medicine

## 2022-10-13 ENCOUNTER — Other Ambulatory Visit: Payer: Self-pay | Admitting: Nurse Practitioner

## 2022-10-13 ENCOUNTER — Ambulatory Visit: Payer: Medicaid Other | Admitting: Family Medicine

## 2022-10-13 VITALS — BP 129/72 | HR 76 | Ht 61.0 in | Wt 144.1 lb

## 2022-10-13 DIAGNOSIS — Z23 Encounter for immunization: Secondary | ICD-10-CM

## 2022-10-13 DIAGNOSIS — R7303 Prediabetes: Secondary | ICD-10-CM | POA: Diagnosis not present

## 2022-10-13 DIAGNOSIS — E559 Vitamin D deficiency, unspecified: Secondary | ICD-10-CM | POA: Diagnosis not present

## 2022-10-13 DIAGNOSIS — Z780 Asymptomatic menopausal state: Secondary | ICD-10-CM

## 2022-10-13 DIAGNOSIS — M7501 Adhesive capsulitis of right shoulder: Secondary | ICD-10-CM

## 2022-10-13 DIAGNOSIS — D500 Iron deficiency anemia secondary to blood loss (chronic): Secondary | ICD-10-CM

## 2022-10-13 DIAGNOSIS — F419 Anxiety disorder, unspecified: Secondary | ICD-10-CM

## 2022-10-13 DIAGNOSIS — E038 Other specified hypothyroidism: Secondary | ICD-10-CM | POA: Diagnosis not present

## 2022-10-13 DIAGNOSIS — Z0001 Encounter for general adult medical examination with abnormal findings: Secondary | ICD-10-CM

## 2022-10-13 DIAGNOSIS — J302 Other seasonal allergic rhinitis: Secondary | ICD-10-CM

## 2022-10-13 DIAGNOSIS — E782 Mixed hyperlipidemia: Secondary | ICD-10-CM

## 2022-10-13 DIAGNOSIS — M25511 Pain in right shoulder: Secondary | ICD-10-CM | POA: Insufficient documentation

## 2022-10-13 DIAGNOSIS — E039 Hypothyroidism, unspecified: Secondary | ICD-10-CM | POA: Diagnosis not present

## 2022-10-13 MED ORDER — LEVOTHYROXINE SODIUM 75 MCG PO TABS: 75.0000 ug | ORAL_TABLET | Freq: Every day | ORAL | 2 refills | Status: DC

## 2022-10-13 MED ORDER — ATORVASTATIN CALCIUM 20 MG PO TABS: 10.0000 mg | ORAL_TABLET | Freq: Every day | ORAL | 2 refills | Status: DC

## 2022-10-13 MED ORDER — METHYLPREDNISOLONE 4 MG PO TBPK: ORAL_TABLET | ORAL | 0 refills | Status: DC

## 2022-10-13 MED ORDER — ALBUTEROL SULFATE HFA 108 (90 BASE) MCG/ACT IN AERS: INHALATION_SPRAY | RESPIRATORY_TRACT | 3 refills | Status: DC

## 2022-10-13 MED ORDER — DESVENLAFAXINE SUCCINATE ER 50 MG PO TB24: 50.0000 mg | ORAL_TABLET | Freq: Every day | ORAL | 1 refills | Status: DC

## 2022-10-13 MED ORDER — MECLIZINE HCL 25 MG PO TABS: 25.0000 mg | ORAL_TABLET | Freq: Three times a day (TID) | ORAL | 2 refills | Status: DC | PRN

## 2022-10-13 NOTE — Progress Notes (Deleted)
Established Patient Office Visit  Subjective:  Patient ID: Kathleen Erickson, female    DOB: 07-22-1961  Age: 62 y.o. MRN: [id number]  CC:  Chief Complaint  Patient presents with   Annual Exam    6 month visit for CPE.    Shoulder Pain    Right shoulder pain,     HPI Kathleen Erickson is a 62 y.o. female with past medical history of *** presents for f/u of *** chronic medical conditions.  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 Rourk:External and internal hemorrhoids-grade 4; otherwise normal rectum total colon and terminal ileum.   COLONOSCOPY WITH PROPOFOL N/A 11/26/2017   external and internal hemorrhoids, prominent anal papilla, normal rectum and colon, normal TI.    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   COLPOSCOPY     ESOPHAGOGASTRODUODENOSCOPY N/A 06/22/2014   Dr. Gala Romney:The mucosa of the esophagus appeared normal  Single gastric ulcer ranging between 3-5 mm in size was found   Biopsy with negative H.pylori.     ESOPHAGOGASTRODUODENOSCOPY N/A 10/28/2014   Dr. Gala Romney: patulous EG junction, small hiatal hernia, previous noted gastric ulcer healed   ESOPHAGOGASTRODUODENOSCOPY (EGD) WITH PROPOFOL N/A 11/26/2017   normal esophagus, normal stomach, normal duodenum.    ESOPHAGOGASTRODUODENOSCOPY (EGD) WITH PROPOFOL N/A 08/22/2021   normal esophagus, normal stomach (minimal polypoid mucosa), normal duodenal bulb, subtle finding somewhat suspicious for hemobilia or pancreaticus. Pathology revealed reactive gastropathy and negative for H. pylori.   GIVENS CAPSULE STUDY N/A 02/14/2021   Procedure: GIVENS CAPSULE STUDY;  Surgeon: Daneil Dolin, MD;  [facility];  Service: Endoscopy;  Laterality: N/A;  7:30am   HEMOSTASIS CLIP PLACEMENT  08/22/2021   Procedure: HEMOSTASIS CLIP PLACEMENT;  Surgeon: Daneil Dolin, MD;  [facility];  Service: Endoscopy;;   HOT HEMOSTASIS  08/22/2021   Procedure: HOT HEMOSTASIS (ARGON PLASMA COAGULATION/BICAP);  Surgeon: Daneil Dolin, MD;  [facility];  Service: Endoscopy;;   VAGINAL HYSTERECTOMY     bleeding; she thinks it was a partial and has ovaries in place    Family History  Problem Relation Age of Onset   Uterine cancer Mother    Heart disease Mother    Alcohol abuse Mother    Arthritis Mother    COPD Mother  Depression Mother    Ovarian cancer Mother        dx 57s   Heart disease Father        age 36, family questions autopsy   Alcohol abuse Father    Arthritis Father    Depression Father    Early death Father    Cancer Other        ulcer, aunt   Lung cancer Cousin        dx 53s, then again in her 84s (maternal first cousin)   Throat cancer Cousin    Asthma Son    COPD Sister    Lung cancer Maternal Aunt        dx 60s/70s, smoker, ulcer   Cancer Maternal Uncle        dx 6s, unknown primary (affected eye/head, colon, stomach), chewed tobacco   Gout Paternal Uncle    Seizures Paternal Uncle    Lung cancer  Maternal Aunt        dx 74s, non smoker   Cancer Maternal Aunt        dx 87s, mouth cancer, chewed tobacco   Skin cancer Maternal Uncle    Skin cancer Maternal Uncle    Breast cancer Niece 39   Cancer Niece        unknown type, dx 30s   Lung cancer Cousin        dx late 18s (maternal first cousin)   Skin cancer Cousin        multiple (maternal first cousin)   Colon cancer Neg Hx    Ulcers Neg Hx     Social History   Socioeconomic History   Marital status: Legally Separated    Spouse name: [name]   Number of children: 2   Years of education: 10   Highest education level: Not on file  Occupational History   Occupation: not working    Comment: cleaned houses    Comment: disabled from arthritis  Tobacco Use   Smoking status: Former    Packs/day: 0.25    Years: 40.00    Total pack years: 10.00    Types: Cigarettes    Start date: 09/25/1973    Quit date: 05/17/2020    Years since quitting: 2.4   Smokeless tobacco: Never  Vaping Use   Vaping Use: Never used  Substance and Sexual Activity   Alcohol use: No   Drug use: No   Sexual activity: Yes    Birth control/protection: None  Other Topics Concern   Not on file  Social History Narrative   Lives alone , has family around her.    Social Determinants of Health   Financial Resource Strain: Not on file  Food Insecurity: Not on file  Transportation Needs: Not on file  Physical Activity: Not on file  Stress: Not on file  Social Connections: Not on file  Intimate Partner Violence: Not on file    Outpatient Medications Prior to Visit  Medication Sig Dispense Refill   atorvastatin (LIPITOR) 20 MG tablet TAKE 1/2 TABLET BY MOUTH DAILY 45 tablet 1   bisacodyl (GENTLE LAXATIVE) 5 MG EC tablet Take 5 mg by mouth daily as needed for moderate constipation.     Cholecalciferol (VITAMIN D) 50 MCG (2000 UT) CAPS Take by mouth.     clobetasol ointment (TEMOVATE) 0.05 % APPLY TO AFFECTED AREA TWICE A DAY 30 g 0   desvenlafaxine  (PRISTIQ) 50 MG 24 hr tablet TAKE 1 TABLET BY MOUTH EVERY DAY  90 tablet 1   hydrocortisone (ANUSOL-HC) 2.5 % rectal cream PLACE 1 APPLICATION RECTALLY 2 (TWO) TIMES DAILY. 30 g 1   levothyroxine (SYNTHROID) 75 MCG tablet TAKE 1 TABLET BY MOUTH EVERY DAY 90 tablet 0   LINZESS 290 MCG CAPS capsule TAKE 1 CAPSULE BY MOUTH EVERY DAY 30 capsule 5   meclizine (ANTIVERT) 25 MG tablet Take 25 mg by mouth 3 (three) times daily as needed for dizziness.     mometasone (ELOCON) 0.1 % cream Apply 1 application topically daily as needed (dry ear).     Omega-3 Fatty Acids (FISH OIL) 1000 MG CAPS Take 2,000 mg by mouth daily.     PROAIR HFA 108 (90 Base) MCG/ACT inhaler TAKE 2 PUFFS BY MOUTH EVERY 6 HOURS AS NEEDED FOR WHEEZE OR SHORTNESS OF BREATH 8.5 each 3   Turmeric (QC TUMERIC COMPLEX PO) Take 3,000 mg by mouth once. Once daily     UNABLE TO FIND Take 1 Piece of gum by mouth daily. Med Name: SKIN & GUT essential probiotic     UNABLE TO FIND Med Name: GOLO     vitamin B-12 (CYANOCOBALAMIN) 250 MCG tablet Take 250 mcg by mouth daily.     estradiol (ESTRACE) 2 MG tablet TAKE 1 TABLET BY MOUTH EVERY DAY (Patient taking differently: Take 2 mg by mouth daily.) 90 tablet 1   Biotin 10000 MCG TABS Take 1,000 mcg by mouth 3 (three) times a week. (Patient not taking: Reported on 05/16/2022)     Cholecalciferol (VITAMIN D-3) 125 MCG (5000 UT) TABS Take 10,000 Units by mouth daily.     omeprazole (PRILOSEC) 40 MG capsule Take 1 capsule by mouth once or twice daily before meals 180 capsule 1   No facility-administered medications prior to visit.    Allergies  Allergen Reactions   Venofer [Iron Sucrose] Other (See Comments)    Patient had a hypersensitivity reaction to Venofer.  See progress note from 04/25/21.   Feraheme [Ferumoxytol] Swelling and Rash   Penicillins Hives    Has patient had a PCN reaction causing immediate rash, facial/tongue/throat swelling, SOB or lightheadedness with hypotension: No Has patient  had a PCN reaction causing severe rash involving mucus membranes or skin necrosis: Yes Has patient had a PCN reaction that required hospitalization: No Has patient had a PCN reaction occurring within the last 10 years: No If all of the above answers are "NO", then may proceed with Cephalosporin use.    Morphine And Related Itching          ROS Review of Systems    Objective:    Physical Exam  BP 129/72   Pulse 76   Ht '5\' 1"'$  (1.549 m)   Wt 144 lb 1.9 oz (65.4 kg)   SpO2 95%   BMI 27.23 kg/m  Wt Readings from Last 3 Encounters:  10/13/22 144 lb 1.9 oz (65.4 kg)  05/16/22 142 lb (64.4 kg)  04/07/22 141 lb (64 kg)    Lab Results  Component Value Date   TSH 0.834 04/10/2022   Lab Results  Component Value Date   WBC 4.2 12/27/2021   HGB 10.2 (L) 12/27/2021   HCT 34.0 12/27/2021   MCV 66 (L) 12/27/2021   PLT 253 12/27/2021   Lab Results  Component Value Date   NA 138 12/27/2021   K 4.3 12/27/2021   CO2 25 12/27/2021   GLUCOSE 90 12/27/2021   BUN 18 12/27/2021   CREATININE 0.65 12/27/2021   BILITOT 0.4 12/27/2021  ALKPHOS 53 12/27/2021   AST 18 12/27/2021   ALT 16 12/27/2021   PROT 6.1 12/27/2021   ALBUMIN 4.3 12/27/2021   CALCIUM 9.0 12/27/2021   ANIONGAP 9 02/11/2020   EGFR 99 12/27/2021   Lab Results  Component Value Date   CHOL 173 04/10/2022   Lab Results  Component Value Date   HDL 57 04/10/2022   Lab Results  Component Value Date   LDLCALC 105 (H) 04/10/2022   Lab Results  Component Value Date   TRIG 54 04/10/2022   Lab Results  Component Value Date   CHOLHDL 3.0 04/10/2022   Lab Results  Component Value Date   HGBA1C 5.2 12/27/2021      Assessment & Plan:  Immunization due  Mixed hyperlipidemia  Annual physical exam  Menopause  Anxiety  Seasonal allergies  Acquired hypothyroidism  Vitamin D deficiency  Iron deficiency anemia due to chronic blood loss  Prediabetes    Follow-up: No follow-ups on file.    Alvira Monday, FNP

## 2022-10-13 NOTE — Patient Instructions (Addendum)
I appreciate the opportunity to provide care to you today!    Follow up:  4 months f/u and 1 year for CPE  Labs: please stop by the lab today to get your blood drawn (CBC, CMP, TSH, Lipid profile, HgA1c, Vit D)  Please pick up your refills at the pharmacy        Please schedule the patient for her yearly mammogram Soreness on the armpit is typically a sign of strain muscle, and recommend avoiding activities that aggravates the pain.  Take Tylenol as needed for pain control.  Right shoulder pain Symptoms of adhesive capsulitis Will treat with oral steroid taper Referral has been placed to physical therapy Continue taking Tylenol as needed for pain control Please avoid strenuous activity to the affected arm during the course of therapy Please inform me of worsening of symptoms, will consider referral to orthopedics if symptoms does not improve with physical therapy and steroid taper    Please follow-up with GI for symptoms of GERD as needed  Please continue to a heart-healthy diet and increase your physical activities. Try to exercise for 18mns at least five times a week.   Physical activity helps: Lower your blood glucose, improve your heart health, lower your blood pressure and cholesterol, burn calories to help manage her weight, gave you energy, lower stress, and improve his sleep.  The American diabetes Association (ADA) recommends being active for 2-1/2 hours (150 minutes) or more week.  Exercise for 30 minutes, 5 days a week (150 minutes total)      It was a pleasure to see you and I look forward to continuing to work together on your health and well-being. Please do not hesitate to call the office if you need care or have questions about your care.   Have a wonderful day and week. With Gratitude, GAlvira MondayMSN, FNP-BC

## 2022-10-13 NOTE — Progress Notes (Addendum)
Complete physical exam  Patient: Kathleen Erickson   DOB: Feb 08, 1961   62 y.o. Female  MRN: [id number]  Subjective:    Chief Complaint  Patient presents with   Annual Exam    6 month visit for CPE.    Shoulder Pain    Right shoulder pain,     NOTA BEW is a 62 y.o. female who presents today for a complete physical exam. She reports consuming a general diet. Home exercise routine includes lifting weights 2-3 times weekly. She generally feels well. She reports sleeping well. She does have additional problems to discuss today.    Most recent fall risk assessment:    11/15/2022    8:24 AM  Fall Risk   Falls in the past year? 0  Number falls in past yr: 0  Injury with Fall? 0  Risk for fall due to : No Fall Risks  Follow up Falls evaluation completed     Most recent depression screenings:    11/15/2022    8:24 AM 10/13/2022    8:52 AM  PHQ 2/9 Scores  PHQ - 2 Score 0 4  PHQ- 9 Score 0 12    Dental: No current dental problems and No regular dental care   Patient Active Problem List   Diagnosis Date Noted   Right shoulder pain 10/13/2022   Skin lesion of right lower extremity 04/07/2022   Impaired glucose tolerance in nonobese 12/02/2021   Encounter for general adult medical examination with abnormal findings 08/31/2021   Need for immunization against influenza 08/31/2021   Menopause 06/13/2021   Vitamin D deficiency 06/13/2021   Genetic testing 03/02/2021   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[place])    Family history of ovarian cancer    Family history of breast cancer    Family history of lung cancer    Family history of skin cancer    IDA (iron deficiency anemia) 01/21/2021   Constipation 10/16/2017   Thalassemia 07/17/2017   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   Cigarette nicotine dependence without complication 99991111   Anxiety 12/28/2015   Depression 12/28/2015   Adjustment disorder with mixed anxiety and depressed mood 11/07/2015   PUD (peptic ulcer disease) 07/23/2014    GERD (gastroesophageal reflux disease) 05/29/2014   Hyperlipidemia 01/20/2014   Hypothyroidism 01/20/2014   Past Medical History:  Diagnosis Date   Allergy    Anemia    thalcemia   Anxiety    Arthritis    Cancer ([place])    Cervical cancer   COPD (chronic obstructive pulmonary disease) ([place])    Depression    Dyspnea    Family history of breast cancer    Family history of lung cancer    Family history of ovarian cancer    Family history of skin cancer    GERD (gastroesophageal reflux disease)    Hyperlipidemia    Hypothyroidism    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 Rourk:External and internal hemorrhoids-grade 4; otherwise normal rectum total colon and terminal ileum.   COLONOSCOPY WITH PROPOFOL N/A 11/26/2017   external and internal hemorrhoids, prominent  anal papilla, normal rectum and colon, normal TI.    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   COLPOSCOPY     ESOPHAGOGASTRODUODENOSCOPY N/A 06/22/2014   Dr. Gala Romney:The mucosa of the esophagus appeared normal  Single gastric ulcer ranging between 3-5 mm in size was found   Biopsy with negative H.pylori.    ESOPHAGOGASTRODUODENOSCOPY N/A 10/28/2014   Dr. Gala Romney: patulous EG junction, small hiatal hernia, previous noted gastric ulcer healed   ESOPHAGOGASTRODUODENOSCOPY (EGD) WITH PROPOFOL N/A 11/26/2017   normal esophagus, normal stomach, normal duodenum.    ESOPHAGOGASTRODUODENOSCOPY (EGD) WITH  PROPOFOL N/A 08/22/2021   normal esophagus, normal stomach (minimal polypoid mucosa), normal duodenal bulb, subtle finding somewhat suspicious for hemobilia or pancreaticus. Pathology revealed reactive gastropathy and negative for H. pylori.   GIVENS CAPSULE STUDY N/A 02/14/2021   Procedure: GIVENS CAPSULE STUDY;  Surgeon: Daneil Dolin, MD;  [facility];  Service: Endoscopy;  Laterality: N/A;  7:30am   HEMOSTASIS CLIP PLACEMENT  08/22/2021   Procedure: HEMOSTASIS CLIP PLACEMENT;  Surgeon: Daneil Dolin, MD;  [facility];  Service: Endoscopy;;   HOT HEMOSTASIS  08/22/2021   Procedure: HOT HEMOSTASIS (ARGON PLASMA COAGULATION/BICAP);  Surgeon: Daneil Dolin, MD;  [facility];  Service: Endoscopy;;   VAGINAL HYSTERECTOMY     bleeding; she thinks it was a partial and has ovaries in place   Social History   Tobacco Use   Smoking status: Former    Packs/day: 0.25    Years: 40.00    Total pack years: 10.00    Types: Cigarettes    Start date: 09/25/1973    Quit date: 05/17/2020    Years since quitting: 2.4   Smokeless tobacco: Never  Vaping Use   Vaping Use: Never used  Substance Use Topics   Alcohol use: No   Drug use: No   Social History   Socioeconomic History   Marital status: Legally Separated    Spouse name: [name]   Number of children: 2   Years of education: 10   Highest education level: Not on file  Occupational History   Occupation: not working    Comment: cleaned houses    Comment: disabled from arthritis  Tobacco Use   Smoking status: Former    Packs/day: 0.25    Years: 40.00    Total pack years: 10.00    Types: Cigarettes    Start date: 09/25/1973    Quit date: 05/17/2020    Years since quitting: 2.4   Smokeless tobacco: Never  Vaping Use   Vaping Use: Never used  Substance and Sexual Activity   Alcohol use: No   Drug use: No   Sexual activity: Yes    Birth control/protection: None  Other Topics Concern   Not on  file  Social History Narrative   Lives alone , has family around her.    Social Determinants of Health   Financial Resource Strain: Not on file  Food Insecurity: Not on file  Transportation Needs: Not on file  Physical Activity: Not on file  Stress: Not on file  Social Connections: Not on file  Intimate Partner Violence: Not on file   Family Status  Relation Name Status   Mother  Deceased   Father  Deceased   Other  (Not Specified)  Cousin brenda Deceased   Cousin  (Not Specified)   Sister debbie Alive   Daughter [place]   Son floyd Alive   Sister anita Alive   Sister carolyn Alive       maternal half-sister   Mat Aunt gracie Deceased   Mat Uncle bill Deceased   Pat Uncle  Deceased   MGM  Deceased   MGF  Deceased   PGM  Deceased   PGF  Deceased   Mat Aunt dorothy Deceased   Mat Aunt margaret Deceased   Mat Aunt betsy Deceased   Mat Uncle ernest Deceased   Mat Uncle bronzy Deceased   Mat Uncle x3 Deceased   Niece  Alive   Niece  Alive   Cousin Dillard Deceased   Cousin  Alive   Neg Hx  (Not Specified)   Family History  Problem Relation Age of Onset   Uterine cancer Mother    Heart disease Mother    Alcohol abuse Mother    Arthritis Mother    COPD Mother    Depression Mother    Ovarian cancer Mother        dx 40s   Heart disease Father        age 13, family questions autopsy   Alcohol abuse Father    Arthritis Father    Depression Father    Early death Father    Cancer Other        ulcer, aunt   Lung cancer Cousin        dx 39s, then again in her 60s (maternal first cousin)   Throat cancer Cousin    Asthma Son    COPD Sister    Lung cancer Maternal Aunt        dx 60s/70s, smoker, ulcer   Cancer Maternal Uncle        dx 82s, unknown primary (affected eye/head, colon, stomach), chewed tobacco   Gout Paternal Uncle    Seizures Paternal Uncle    Lung cancer Maternal Aunt        dx 58s, non smoker   Cancer Maternal Aunt        dx 69s, mouth  cancer, chewed tobacco   Skin cancer Maternal Uncle    Skin cancer Maternal Uncle    Breast cancer Niece 65   Cancer Niece        unknown type, dx 38s   Lung cancer Cousin        dx late 23s (maternal first cousin)   Skin cancer Cousin        multiple (maternal first cousin)   Colon cancer Neg Hx    Ulcers Neg Hx    Allergies  Allergen Reactions   Venofer [Iron Sucrose] Other (See Comments)    Patient had a hypersensitivity reaction to Venofer.  See progress note from 04/25/21.   Feraheme [Ferumoxytol] Swelling and Rash   Penicillins Hives    Has patient had a PCN reaction causing immediate rash, facial/tongue/throat swelling, SOB or lightheadedness with hypotension: No Has patient had a PCN reaction causing severe rash involving mucus membranes or skin necrosis: Yes Has patient had a PCN reaction that required hospitalization: No Has patient had a PCN reaction occurring within the last 10 years: No If all of the above answers are "NO", then may proceed with Cephalosporin use.    Morphine And Related Itching            Patient Care Team: Alvira Monday, FNP as PCP -  General (Family Medicine) Branch, Alphonse Guild, MD as PCP - Cardiology (Cardiology) Daneil Dolin, MD as Consulting Physician (Gastroenterology)   Outpatient Medications Prior to Visit  Medication Sig   bisacodyl (GENTLE LAXATIVE) 5 MG EC tablet Take 5 mg by mouth daily as needed for moderate constipation.   Cholecalciferol (VITAMIN D) 50 MCG (2000 UT) CAPS Take by mouth.   clobetasol ointment (TEMOVATE) 0.05 % APPLY TO AFFECTED AREA TWICE A DAY   hydrocortisone (ANUSOL-HC) 2.5 % rectal cream PLACE 1 APPLICATION RECTALLY 2 (TWO) TIMES DAILY.   LINZESS 290 MCG CAPS capsule TAKE 1 CAPSULE BY MOUTH EVERY DAY   mometasone (ELOCON) 0.1 % cream Apply 1 application topically daily as needed (dry ear).   Omega-3 Fatty Acids (FISH OIL) 1000 MG CAPS Take 2,000 mg by mouth daily.   Turmeric (QC TUMERIC COMPLEX PO) Take  3,000 mg by mouth once. Once daily   UNABLE TO FIND Take 1 Piece of gum by mouth daily. Med Name: SKIN & GUT essential probiotic   UNABLE TO FIND Med Name: GOLO   vitamin B-12 (CYANOCOBALAMIN) 250 MCG tablet Take 250 mcg by mouth daily.   [DISCONTINUED] atorvastatin (LIPITOR) 20 MG tablet TAKE 1/2 TABLET BY MOUTH DAILY   [DISCONTINUED] desvenlafaxine (PRISTIQ) 50 MG 24 hr tablet TAKE 1 TABLET BY MOUTH EVERY DAY   [DISCONTINUED] levothyroxine (SYNTHROID) 75 MCG tablet TAKE 1 TABLET BY MOUTH EVERY DAY   [DISCONTINUED] meclizine (ANTIVERT) 25 MG tablet Take 25 mg by mouth 3 (three) times daily as needed for dizziness.   [DISCONTINUED] PROAIR HFA 108 (90 Base) MCG/ACT inhaler TAKE 2 PUFFS BY MOUTH EVERY 6 HOURS AS NEEDED FOR WHEEZE OR SHORTNESS OF BREATH   estradiol (ESTRACE) 2 MG tablet TAKE 1 TABLET BY MOUTH EVERY DAY (Patient taking differently: Take 2 mg by mouth daily.)   [DISCONTINUED] Biotin 10000 MCG TABS Take 1,000 mcg by mouth 3 (three) times a week. (Patient not taking: Reported on 05/16/2022)   [DISCONTINUED] Cholecalciferol (VITAMIN D-3) 125 MCG (5000 UT) TABS Take 10,000 Units by mouth daily.   [DISCONTINUED] omeprazole (PRILOSEC) 40 MG capsule Take 1 capsule by mouth once or twice daily before meals   No facility-administered medications prior to visit.    Review of Systems  Constitutional:  Negative for chills, fever and malaise/fatigue.  HENT:  Negative for congestion and sinus pain.   Eyes:  Negative for pain, discharge and redness.  Respiratory:  Negative for cough, sputum production and shortness of breath.   Cardiovascular:  Negative for chest pain, palpitations, claudication and leg swelling.  Gastrointestinal:  Negative for diarrhea, heartburn and nausea.  Genitourinary:  Negative for flank pain and frequency.  Musculoskeletal:  Negative for back pain.       Right shoulder pain   Skin:  Negative for itching.  Neurological:  Negative for dizziness, seizures and  headaches.  Endo/Heme/Allergies:  Negative for environmental allergies.  Psychiatric/Behavioral:  Negative for memory loss. The patient does not have insomnia.        Objective:    BP 129/72   Pulse 76   Ht 5' 1"$  (1.549 m)   Wt 144 lb 1.9 oz (65.4 kg)   SpO2 95%   BMI 27.23 kg/m  BP Readings from Last 3 Encounters:  10/13/22 129/72  05/16/22 126/73  04/07/22 124/70   Wt Readings from Last 3 Encounters:  10/13/22 144 lb 1.9 oz (65.4 kg)  05/16/22 142 lb (64.4 kg)  04/07/22 141 lb (64 kg)  Physical Exam HENT:     Head: Normocephalic.     Left Ear: External ear normal.     Mouth/Throat:     Mouth: Mucous membranes are moist.  Eyes:     Extraocular Movements: Extraocular movements intact.     Pupils: Pupils are equal, round, and reactive to light.  Cardiovascular:     Rate and Rhythm: Normal rate and regular rhythm.     Heart sounds: No murmur heard. Pulmonary:     Effort: Pulmonary effort is normal.     Breath sounds: Normal breath sounds.  Abdominal:     Palpations: Abdomen is soft.     Tenderness: There is no right CVA tenderness or left CVA tenderness.  Musculoskeletal:     Right lower leg: No edema.     Left lower leg: No edema.     Comments: Pain noted with internal rotation of the right shoulder Pain noted with the Apley scratch test   Skin:    Findings: No lesion or rash.  Neurological:     Mental Status: She is alert and oriented to person, place, and time.     GCS: GCS eye subscore is 4. GCS verbal subscore is 5. GCS motor subscore is 6.     Cranial Nerves: No facial asymmetry.     Sensory: No sensory deficit.     Motor: No weakness.     Coordination: Coordination normal. Finger-Nose-Finger Test normal.     Gait: Gait normal.  Psychiatric:        Judgment: Judgment normal.     Results for orders placed or performed in visit on 10/13/22  CMP14+EGFR  Result Value Ref Range   Glucose 93 70 - 99 mg/dL   BUN 16 8 - 27 mg/dL   Creatinine, Ser  0.77 0.57 - 1.00 mg/dL   eGFR 87 >59 mL/min/1.73   BUN/Creatinine Ratio 21 12 - 28   Sodium 141 134 - 144 mmol/L   Potassium 4.9 3.5 - 5.2 mmol/L   Chloride 102 96 - 106 mmol/L   CO2 24 20 - 29 mmol/L   Calcium 9.2 8.7 - 10.3 mg/dL   Total Protein 7.0 6.0 - 8.5 g/dL   Albumin 4.9 3.9 - 4.9 g/dL   Globulin, Total 2.1 1.5 - 4.5 g/dL   Albumin/Globulin Ratio 2.3 (H) 1.2 - 2.2   Bilirubin Total 0.4 0.0 - 1.2 mg/dL   Alkaline Phosphatase 86 44 - 121 IU/L   AST 29 0 - 40 IU/L   ALT 25 0 - 32 IU/L  CBC with Differential/Platelet  Result Value Ref Range   WBC 5.3 3.4 - 10.8 x10E3/uL   RBC 6.01 (H) 3.77 - 5.28 x10E6/uL   Hemoglobin 11.6 11.1 - 15.9 g/dL   Hematocrit 37.9 34.0 - 46.6 %   MCV 63 (L) 79 - 97 fL   MCH 19.3 (L) 26.6 - 33.0 pg   MCHC 30.6 (L) 31.5 - 35.7 g/dL   RDW 16.3 (H) 11.7 - 15.4 %   Platelets 272 150 - 450 x10E3/uL   Neutrophils 56 Not Estab. %   Lymphs 33 Not Estab. %   Monocytes 7 Not Estab. %   Eos 3 Not Estab. %   Basos 1 Not Estab. %   Neutrophils Absolute 3.0 1.4 - 7.0 x10E3/uL   Lymphocytes Absolute 1.7 0.7 - 3.1 x10E3/uL   Monocytes Absolute 0.4 0.1 - 0.9 x10E3/uL   EOS (ABSOLUTE) 0.2 0.0 - 0.4 x10E3/uL   Basophils Absolute 0.1 0.0 -  0.2 x10E3/uL   Immature Granulocytes 0 Not Estab. %   Immature Grans (Abs) 0.0 0.0 - 0.1 x10E3/uL  Lipid panel  Result Value Ref Range   Cholesterol, Total 228 (H) 100 - 199 mg/dL   Triglycerides 70 0 - 149 mg/dL   HDL 80 >39 mg/dL   VLDL Cholesterol Cal 12 5 - 40 mg/dL   LDL Chol Calc (NIH) 136 (H) 0 - 99 mg/dL   Chol/HDL Ratio 2.9 0.0 - 4.4 ratio  TSH + free T4  Result Value Ref Range   TSH 44.700 (H) 0.450 - 4.500 uIU/mL   Free T4 0.38 (L) 0.82 - 1.77 ng/dL  VITAMIN D 25 Hydroxy (Vit-D Deficiency, Fractures)  Result Value Ref Range   Vit D, 25-Hydroxy 39.3 30.0 - 100.0 ng/mL  Hemoglobin A1c  Result Value Ref Range   Hgb A1c MFr Bld 5.6 4.8 - 5.6 %   Est. average glucose Bld gHb Est-mCnc 114 mg/dL   Last  CBC Lab Results  Component Value Date   WBC 5.3 10/13/2022   HGB 11.6 10/13/2022   HCT 37.9 10/13/2022   MCV 63 (L) 10/13/2022   MCH 19.3 (L) 10/13/2022   RDW 16.3 (H) 10/13/2022   PLT 272 99991111   Last metabolic panel Lab Results  Component Value Date   GLUCOSE 93 10/13/2022   NA 141 10/13/2022   K 4.9 10/13/2022   CL 102 10/13/2022   CO2 24 10/13/2022   BUN 16 10/13/2022   CREATININE 0.77 10/13/2022   EGFR 87 10/13/2022   CALCIUM 9.2 10/13/2022   PROT 7.0 10/13/2022   ALBUMIN 4.9 10/13/2022   LABGLOB 2.1 10/13/2022   AGRATIO 2.3 (H) 10/13/2022   BILITOT 0.4 10/13/2022   ALKPHOS 86 10/13/2022   AST 29 10/13/2022   ALT 25 10/13/2022   ANIONGAP 9 02/11/2020   Last lipids Lab Results  Component Value Date   CHOL 228 (H) 10/13/2022   HDL 80 10/13/2022   LDLCALC 136 (H) 10/13/2022   TRIG 70 10/13/2022   CHOLHDL 2.9 10/13/2022   Last hemoglobin A1c Lab Results  Component Value Date   HGBA1C 5.6 10/13/2022   Last thyroid functions Lab Results  Component Value Date   TSH 1.390 11/10/2022   T4TOTAL 11.4 07/06/2020   Last vitamin D Lab Results  Component Value Date   VD25OH 39.3 10/13/2022   Last vitamin B12 and Folate Lab Results  Component Value Date   VITAMINB12 333 10/16/2017   FOLATE >24.0 10/16/2017        Assessment & Plan:    Routine Health Maintenance and Physical Exam  Immunization History  Administered Date(s) Administered   Fluad Quad(high Dose 65+) 10/13/2022   Influenza Split 06/29/2015, 06/12/2016, 07/02/2017   Influenza,inj,Quad PF,6+ Mos 07/02/2017, 07/10/2019, 07/06/2020, 08/31/2021   Moderna Covid-19 Vaccine Bivalent Booster 66yr & up 10/13/2021   Moderna Sars-Covid-2 Vaccination 05/20/2020, 06/25/2020   Pneumococcal Polysaccharide-23 07/06/2020   Tdap 01/31/2017    Health Maintenance  Topic Date Due   Zoster Vaccines- Shingrix (1 of 2) Never done   COVID-19 Vaccine (4 - 2023-24 season) 05/26/2022   MAMMOGRAM   10/19/2024   DTaP/Tdap/Td (2 - Td or Tdap) 02/01/2027   COLONOSCOPY (Pts 45-437yrInsurance coverage will need to be confirmed)  08/23/2031   INFLUENZA VACCINE  Completed   HIV Screening  Completed   HPV VACCINES  Aged Out   PAP SMEAR-Modifier  Discontinued   Hepatitis C Screening  Discontinued    Discussed health benefits of  physical activity, and encouraged her to engage in regular exercise appropriate for her age and condition.  Encounter for general adult medical examination with abnormal findings Assessment & Plan: Physical exam as documented Counseling is done on healthy lifestyle involving commitment to 150 minutes of exercise per week,  Discussed heart-healthy diet  Encouraged to get the shingles vaccine at her local pharmacy We will follow-up in a year for annual physical   Right shoulder pain, unspecified chronicity Assessment & Plan: Ongoing symptoms for 4-5 months She reports lifting 5 to 10 pounds  She reports pain when sleeping on affected side at night Pain with activities such as putting on a shirt She has taken Tylenol for pain relief Pain noted with the Apley scratch test  Will treat patient for adhesive capsulitis Oral steroid taper ordered Referral placed to physical therapy Will consider orthopedics referral if symptoms does not subside with current treatment regimen   Orders: -     Ambulatory referral to Occupational Therapy  Other specified hypothyroidism Assessment & Plan: Reports being out of her Synthroid 75 mcg for 2 weeks Refilled Synthroid 75 mcg Will assess thyroid levels today  Orders: -     Levothyroxine Sodium; Take 1 tablet (75 mcg total) by mouth daily.  Dispense: 30 tablet; Refill: 2  Need for immunization against influenza Assessment & Plan: Patient educated on CDC recommendation for the vaccine. Verbal consent was obtained from the patient, vaccine administered by nurse, no sign of adverse reactions noted at this time. Patient  education on arm soreness and use of tylenol or ibuprofen for this patient  was discussed. Patient educated on the signs and symptoms of adverse effect and advise to contact the office if they occur.   Orders: -     Flu Vaccine QUAD High Dose(Fluad)  Immunization due  Mixed hyperlipidemia -     Atorvastatin Calcium; Take 0.5 tablets (10 mg total) by mouth daily.  Dispense: 90 tablet; Refill: 2 -     CMP14+EGFR -     Lipid panel  Menopause -     Meclizine HCl; Take 1 tablet (25 mg total) by mouth 3 (three) times daily as needed for dizziness.  Dispense: 90 tablet; Refill: 2  Anxiety -     Desvenlafaxine Succinate ER; Take 1 tablet (50 mg total) by mouth daily.  Dispense: 90 tablet; Refill: 1  Seasonal allergies -     Albuterol Sulfate HFA; TAKE 2 PUFFS BY MOUTH EVERY 6 HOURS AS NEEDED FOR WHEEZE OR SHORTNESS OF BREATH  Dispense: 8.5 each; Refill: 3  Acquired hypothyroidism Assessment & Plan: Reports being out of her Synthroid 75 mcg for 2 weeks Refilled Synthroid 75 mcg Will assess thyroid levels today  Orders: -     TSH + free T4  Vitamin D deficiency -     VITAMIN D 25 Hydroxy (Vit-D Deficiency, Fractures)  Iron deficiency anemia due to chronic blood loss  Prediabetes -     CBC with Differential/Platelet -     Hemoglobin A1c  Adhesive capsulitis of right shoulder -     methylPREDNISolone; Take as the package instructed  Dispense: 1 each; Refill: 0 -     Ambulatory referral to Physical Therapy    Return in about 1 year (around 10/14/2023) for CPE.     Alvira Monday, FNP

## 2022-10-13 NOTE — Assessment & Plan Note (Signed)
Physical exam as documented Counseling is done on healthy lifestyle involving commitment to 150 minutes of exercise per week,  Discussed heart-healthy diet  Encouraged to get the shingles vaccine at her local pharmacy We will follow-up in a year for annual physical

## 2022-10-13 NOTE — Assessment & Plan Note (Signed)
Reports being out of her Synthroid 75 mcg for 2 weeks Refilled Synthroid 75 mcg Will assess thyroid levels today

## 2022-10-13 NOTE — Assessment & Plan Note (Addendum)
Ongoing symptoms for 4-5 months She reports lifting 5 to 10 pounds  She reports pain when sleeping on affected side at night Pain with activities such as putting on a shirt She has taken Tylenol for pain relief Pain noted with the Apley scratch test  Will treat patient for adhesive capsulitis Oral steroid taper ordered Referral placed to physical therapy Will consider orthopedics referral if symptoms does not subside with current treatment regimen

## 2022-10-14 LAB — CBC WITH DIFFERENTIAL/PLATELET
Basophils Absolute: 0.1 10*3/uL (ref 0.0–0.2)
Basos: 1 %
EOS (ABSOLUTE): 0.2 10*3/uL (ref 0.0–0.4)
Eos: 3 %
Hematocrit: 37.9 % (ref 34.0–46.6)
Hemoglobin: 11.6 g/dL (ref 11.1–15.9)
Immature Grans (Abs): 0 10*3/uL (ref 0.0–0.1)
Immature Granulocytes: 0 %
Lymphocytes Absolute: 1.7 10*3/uL (ref 0.7–3.1)
Lymphs: 33 %
MCH: 19.3 pg — ABNORMAL LOW (ref 26.6–33.0)
MCHC: 30.6 g/dL — ABNORMAL LOW (ref 31.5–35.7)
MCV: 63 fL — ABNORMAL LOW (ref 79–97)
Monocytes Absolute: 0.4 10*3/uL (ref 0.1–0.9)
Monocytes: 7 %
Neutrophils Absolute: 3 10*3/uL (ref 1.4–7.0)
Neutrophils: 56 %
Platelets: 272 10*3/uL (ref 150–450)
RBC: 6.01 x10E6/uL — ABNORMAL HIGH (ref 3.77–5.28)
RDW: 16.3 % — ABNORMAL HIGH (ref 11.7–15.4)
WBC: 5.3 10*3/uL (ref 3.4–10.8)

## 2022-10-14 LAB — CMP14+EGFR
ALT: 25 IU/L (ref 0–32)
AST: 29 IU/L (ref 0–40)
Albumin/Globulin Ratio: 2.3 — ABNORMAL HIGH (ref 1.2–2.2)
Albumin: 4.9 g/dL (ref 3.9–4.9)
Alkaline Phosphatase: 86 IU/L (ref 44–121)
BUN/Creatinine Ratio: 21 (ref 12–28)
BUN: 16 mg/dL (ref 8–27)
Bilirubin Total: 0.4 mg/dL (ref 0.0–1.2)
CO2: 24 mmol/L (ref 20–29)
Calcium: 9.2 mg/dL (ref 8.7–10.3)
Chloride: 102 mmol/L (ref 96–106)
Creatinine, Ser: 0.77 mg/dL (ref 0.57–1.00)
Globulin, Total: 2.1 g/dL (ref 1.5–4.5)
Glucose: 93 mg/dL (ref 70–99)
Potassium: 4.9 mmol/L (ref 3.5–5.2)
Sodium: 141 mmol/L (ref 134–144)
Total Protein: 7 g/dL (ref 6.0–8.5)
eGFR: 87 mL/min/{1.73_m2} (ref >59)

## 2022-10-14 LAB — HEMOGLOBIN A1C
Est. average glucose Bld gHb Est-mCnc: 114 mg/dL
Hgb A1c MFr Bld: 5.6 % (ref 4.8–5.6)

## 2022-10-14 LAB — VITAMIN D 25 HYDROXY (VIT D DEFICIENCY, FRACTURES): Vit D, 25-Hydroxy: 39.3 ng/mL (ref 30.0–100.0)

## 2022-10-14 LAB — LIPID PANEL
Chol/HDL Ratio: 2.9 ratio (ref 0.0–4.4)
Cholesterol, Total: 228 mg/dL — ABNORMAL HIGH (ref 100–199)
HDL: 80 mg/dL (ref >39)
LDL Chol Calc (NIH): 136 mg/dL — ABNORMAL HIGH (ref 0–99)
Triglycerides: 70 mg/dL (ref 0–149)
VLDL Cholesterol Cal: 12 mg/dL (ref 5–40)

## 2022-10-14 LAB — TSH+FREE T4
Free T4: 0.38 ng/dL — ABNORMAL LOW (ref 0.82–1.77)
TSH: 44.7 u[IU]/mL — ABNORMAL HIGH (ref 0.450–4.500)

## 2022-10-16 ENCOUNTER — Inpatient Hospital Stay (HOSPITAL_COMMUNITY): Admission: RE | Admit: 2022-10-16 | Payer: Medicaid Other | Source: Ambulatory Visit

## 2022-10-18 ENCOUNTER — Other Ambulatory Visit: Payer: Self-pay | Admitting: Family Medicine

## 2022-10-18 NOTE — Addendum Note (Signed)
Addended byAlvira Monday on: 10/18/2022 06:00 PM   Modules accepted: Orders

## 2022-10-19 ENCOUNTER — Ambulatory Visit (HOSPITAL_COMMUNITY)
Admission: RE | Admit: 2022-10-19 | Discharge: 2022-10-19 | Disposition: A | Discharge status: Home / Self Care | Payer: Medicaid Other | Source: Ambulatory Visit | Attending: Nurse Practitioner | Admitting: Nurse Practitioner

## 2022-10-19 DIAGNOSIS — Z1231 Encounter for screening mammogram for malignant neoplasm of breast: Secondary | ICD-10-CM | POA: Insufficient documentation

## 2022-10-23 ENCOUNTER — Telehealth: Payer: Self-pay | Admitting: Family Medicine

## 2022-10-23 NOTE — Progress Notes (Signed)
Normal mammogram, repeat in one year

## 2022-10-23 NOTE — Telephone Encounter (Signed)
Does patient need to go to Physical Therapy when finished her medicine , please return patient call [phone].

## 2022-10-23 NOTE — Telephone Encounter (Signed)
Spoke to pt states she is now feeling better doesn't feel like she needs the referral to physical therapy

## 2022-10-25 ENCOUNTER — Other Ambulatory Visit: Payer: Self-pay | Admitting: Family Medicine

## 2022-10-25 DIAGNOSIS — E039 Hypothyroidism, unspecified: Secondary | ICD-10-CM

## 2022-10-25 NOTE — Progress Notes (Signed)
Please encourage the patient to return for labs on November 10, 2022, to assess her thyroid levels.  Her labs indicate that her thyroid levels were low.  Please encourage the patient to continue taking Synthroid 75 mcg daily on an empty stomach.  Her cholesterol levels are elevated; please encourage the patient to take 20 mg of rosuvastatin daily.  I also recommend decreasing her greasy, starchy food intake with increased physical activity. I  also recommend avoiding simple carbohydrates, including cakes, sweet desserts, ice cream, soda (diet or regular), sweet tea, candies, chips, cookies, store-bought juices, alcohol in excess of 1-2 drinks a day, lemonade, artificial sweeteners, donuts, coffee creamers, and sugar-free products.  I also recommend increasing her intake of iron rich foods.

## 2022-11-10 DIAGNOSIS — E039 Hypothyroidism, unspecified: Secondary | ICD-10-CM | POA: Diagnosis not present

## 2022-11-11 LAB — TSH+FREE T4
Free T4: 1.49 ng/dL (ref 0.82–1.77)
TSH: 1.39 u[IU]/mL (ref 0.450–4.500)

## 2022-11-14 NOTE — Progress Notes (Signed)
Please encourage the patient to continue taking Synthroid 75 mcg daily on empty stomach.  Her thyroid levels are stable

## 2022-11-15 ENCOUNTER — Telehealth: Payer: Medicaid Other | Admitting: Family Medicine

## 2022-11-15 ENCOUNTER — Encounter: Payer: Self-pay | Admitting: Family Medicine

## 2022-11-15 DIAGNOSIS — M7501 Adhesive capsulitis of right shoulder: Secondary | ICD-10-CM

## 2022-11-15 DIAGNOSIS — M25511 Pain in right shoulder: Secondary | ICD-10-CM | POA: Diagnosis not present

## 2022-11-15 MED ORDER — METHYLPREDNISOLONE 4 MG PO TBPK: ORAL_TABLET | ORAL | 0 refills | Status: DC

## 2022-11-15 NOTE — Progress Notes (Signed)
Virtual Visit via Video Note  I connected with Kathleen Erickson on [id number] at  8:20 AM EST by a video enabled telemedicine application and verified that I am speaking with the correct person using two identifiers.  Patient Location: Home Provider Location: Office/Clinic  I discussed the limitations, risks, security, and privacy concerns of performing an evaluation and management service by video and the availability of in person appointments. I also discussed with the patient that there may be a patient responsible charge related to this service. The patient expressed understanding and agreed to proceed.  Subjective: PCP: Alvira Monday, FNP  Chief Complaint  Patient presents with   Shoulder Pain    Pt reports right shoulder pain, declined physical therapy, is asking for a rx for prednisone.    HPI The patient is seen today with complaints of chronic right shoulder pain. She was seen and treated for similar symptoms on 10/13/2022 with a prednisone taper and reports relief of symptoms. Today, she complained of right shoulder pain, which she rates as 8 out of 10. Onset of symptoms 3 days ago. A referral was placed to physical therapy; however, the patient states that she has not been contacted to start physical therapy. She reports Tylenol with minimal relief of her symptoms. Pain is described as throbbing and radiating down her shoulders.  No recent injury or trauma reported.   ROS: Per HPI  Current Outpatient Medications:    albuterol (PROAIR HFA) 108 (90 Base) MCG/ACT inhaler, TAKE 2 PUFFS BY MOUTH EVERY 6 HOURS AS NEEDED FOR WHEEZE OR SHORTNESS OF BREATH, Disp: 8.5 each, Rfl: 3   atorvastatin (LIPITOR) 20 MG tablet, Take 0.5 tablets (10 mg total) by mouth daily., Disp: 90 tablet, Rfl: 2   bisacodyl (GENTLE LAXATIVE) 5 MG EC tablet, Take 5 mg by mouth daily as needed for moderate constipation., Disp: , Rfl:    Cholecalciferol (VITAMIN D) 50 MCG (2000 UT) CAPS, Take by mouth., Disp: ,  Rfl:    clobetasol ointment (TEMOVATE) 0.05 %, APPLY TO AFFECTED AREA TWICE A DAY, Disp: 30 g, Rfl: 0   desvenlafaxine (PRISTIQ) 50 MG 24 hr tablet, Take 1 tablet (50 mg total) by mouth daily., Disp: 90 tablet, Rfl: 1   hydrocortisone (ANUSOL-HC) 2.5 % rectal cream, PLACE 1 APPLICATION RECTALLY 2 (TWO) TIMES DAILY., Disp: 30 g, Rfl: 1   levothyroxine (SYNTHROID) 75 MCG tablet, Take 1 tablet (75 mcg total) by mouth daily., Disp: 30 tablet, Rfl: 2   LINZESS 290 MCG CAPS capsule, TAKE 1 CAPSULE BY MOUTH EVERY DAY, Disp: 30 capsule, Rfl: 5   meclizine (ANTIVERT) 25 MG tablet, Take 1 tablet (25 mg total) by mouth 3 (three) times daily as needed for dizziness., Disp: 90 tablet, Rfl: 2   mometasone (ELOCON) 0.1 % cream, Apply 1 application topically daily as needed (dry ear)., Disp: , Rfl:    Omega-3 Fatty Acids (FISH OIL) 1000 MG CAPS, Take 2,000 mg by mouth daily., Disp: , Rfl:    Turmeric (QC TUMERIC COMPLEX PO), Take 3,000 mg by mouth once. Once daily, Disp: , Rfl:    UNABLE TO FIND, Take 1 Piece of gum by mouth daily. Med Name: SKIN & GUT essential probiotic, Disp: , Rfl:    UNABLE TO FIND, Med Name: GOLO, Disp: , Rfl:    vitamin B-12 (CYANOCOBALAMIN) 250 MCG tablet, Take 250 mcg by mouth daily., Disp: , Rfl:    estradiol (ESTRACE) 2 MG tablet, TAKE 1 TABLET BY MOUTH EVERY DAY (Patient taking  differently: Take 2 mg by mouth daily.), Disp: 90 tablet, Rfl: 1   methylPREDNISolone (MEDROL DOSEPAK) 4 MG TBPK tablet, Take as the package instructed, Disp: 1 each, Rfl: 0  Observations/Objective: There were no vitals filed for this visit. Physical Exam No labored breathing.  Speech is clear and coherent with logical content.  Patient is alert and oriented at baseline.   Assessment and Plan: Right shoulder pain, unspecified chronicity -     Ambulatory referral to Orthopedic Surgery -     methylPREDNISolone; Take as the package instructed  Dispense: 1 each; Refill: 0  Adhesive capsulitis of right  shoulder -     methylPREDNISolone; Take as the package instructed  Dispense: 1 each; Refill: 0    Follow Up Instructions: No follow-ups on file. For worsening of symptoms I discussed the assessment and treatment plan with the patient. The patient was provided an opportunity to ask questions, and all were answered. The patient agreed with the plan and demonstrated an understanding of the instructions.   The patient was advised to call back or seek an in-person evaluation if the symptoms worsen or if the condition fails to improve as anticipated.  The above assessment and management plan was discussed with the patient. The patient verbalized understanding of and has agreed to the management plan.   Alvira Monday, FNP

## 2022-12-01 ENCOUNTER — Ambulatory Visit: Payer: Medicaid Other | Admitting: Orthopedic Surgery

## 2022-12-01 ENCOUNTER — Encounter: Payer: Self-pay | Admitting: Orthopedic Surgery

## 2022-12-01 ENCOUNTER — Ambulatory Visit: Payer: Self-pay

## 2022-12-01 ENCOUNTER — Ambulatory Visit (INDEPENDENT_AMBULATORY_CARE_PROVIDER_SITE_OTHER): Payer: Medicaid Other

## 2022-12-01 VITALS — BP 146/61 | HR 64 | Ht 61.0 in | Wt 143.6 lb

## 2022-12-01 DIAGNOSIS — M25511 Pain in right shoulder: Secondary | ICD-10-CM | POA: Diagnosis not present

## 2022-12-01 DIAGNOSIS — M25512 Pain in left shoulder: Secondary | ICD-10-CM

## 2022-12-01 NOTE — Progress Notes (Signed)
New Patient Visit  Assessment: Kathleen Erickson is a 62 y.o. female with the following: 1. Acute pain of both shoulders; R > L   Plan: Kathleen Erickson has pain in both shoulders.  Atraumatic onset.  Right is worse than left.  She has excellent range of motion, and some pain with strength testing.  Radiographs are normal.  She likely has irritated some of the rotator cuff tendons, which is causing her discomfort.  We discussed proceeding with an injection, and she elected proceed.  This was completed in clinic today without issues.  If her left shoulder continues to get worse, and she is interested in an injection, she will return to clinic.  Otherwise, she will follow-up as needed.   Procedure note injection - Right shoulder    Verbal consent was obtained to inject the right shoulder, subacromial space Timeout was completed to confirm the site of injection.   The skin was prepped with alcohol and ethyl chloride was sprayed at the injection site.  A 21-gauge needle was used to inject 40 mg of Depo-Medrol and 1% lidocaine (3 cc) into the subacromial space of the right shoulder using a posterolateral approach.  There were no complications.  A sterile bandage was applied.    Follow-up: Return if symptoms worsen or fail to improve.  Subjective:  Chief Complaint  Patient presents with   Shoulder Pain    Bilat shoulder pain months. Pain started on the R > L. Pt feels the L has started because she's doing more to keep from using her R.    History of Present Illness: Kathleen Erickson is a 62 y.o. female who has been referred by Alvira Monday, FNP for evaluation of right shoulder pain.  She reports that she has had pain in the right shoulder for a couple of months.  No specific injury.  More recently, she has been relying on her left shoulder, and now her left shoulder starting to bother her.  No prior injections.  She has taken prednisone, which relieves her pain, however the pain returns when she  stops taking this medication.  Occasional Tylenol.  She has not worked with physical therapy.  She has some pain with motion above the level of her shoulder.  She also has discomfort over the anterior and lateral aspect of the right shoulder with internal rotation to her lower back.   Review of Systems: No fevers or chills No numbness or tingling No chest pain No shortness of breath No bowel or bladder dysfunction No GI distress No headaches   Medical History:  Past Medical History:  Diagnosis Date   Allergy    Anemia    thalcemia   Anxiety    Arthritis    Cancer ([place])    Cervical cancer   COPD (chronic obstructive pulmonary disease) ([place])    Depression    Dyspnea    Family history of breast cancer    Family history of lung cancer    Family history of ovarian cancer    Family history of skin cancer    GERD (gastroesophageal reflux disease)    Hyperlipidemia    Hypothyroidism    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 Rourk:External and internal hemorrhoids-grade 4; otherwise normal rectum total colon and terminal ileum.   COLONOSCOPY WITH PROPOFOL N/A 11/26/2017   external and internal hemorrhoids, prominent anal papilla, normal rectum and colon, normal TI.    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   COLPOSCOPY     ESOPHAGOGASTRODUODENOSCOPY N/A 06/22/2014   Dr. Gala Romney:The mucosa of the esophagus appeared normal  Single gastric ulcer  ranging between 3-5 mm in size was found   Biopsy with negative H.pylori.    ESOPHAGOGASTRODUODENOSCOPY N/A 10/28/2014   Dr. Gala Romney: patulous EG junction, small hiatal hernia, previous noted gastric ulcer healed   ESOPHAGOGASTRODUODENOSCOPY (EGD) WITH PROPOFOL N/A 11/26/2017   normal esophagus, normal stomach, normal duodenum.    ESOPHAGOGASTRODUODENOSCOPY (EGD) WITH PROPOFOL N/A 08/22/2021   normal esophagus, normal stomach (minimal polypoid mucosa), normal duodenal bulb, subtle finding somewhat suspicious for hemobilia or pancreaticus. Pathology revealed reactive gastropathy and negative for H. pylori.   GIVENS CAPSULE STUDY N/A 02/14/2021   Procedure: GIVENS CAPSULE STUDY;  Surgeon: Daneil Dolin, MD;  Location: [facility];  Service: Endoscopy;  Laterality: N/A;  7:30am   HEMOSTASIS CLIP PLACEMENT  08/22/2021   Procedure: HEMOSTASIS CLIP PLACEMENT;  Surgeon: Daneil Dolin, MD;  [facility];  Service: Endoscopy;;   HOT HEMOSTASIS  08/22/2021   Procedure: HOT HEMOSTASIS (ARGON PLASMA COAGULATION/BICAP);  Surgeon: Daneil Dolin, MD;  [facility];  Service: Endoscopy;;   VAGINAL HYSTERECTOMY     bleeding; she thinks it was a partial and has ovaries in place    Family History  Problem Relation Age of Onset   Uterine cancer Mother    Heart disease Mother    Alcohol abuse Mother    Arthritis Mother    COPD Mother    Depression Mother    Ovarian cancer Mother        dx 80s   Heart disease Father        age 39, family questions autopsy   Alcohol abuse Father    Arthritis Father    Depression Father    Early death Father    Cancer Other        ulcer, aunt   Lung cancer Cousin        dx 8s, then again in her 30s (maternal first cousin)   Throat cancer Cousin    Asthma Son    COPD Sister    Lung cancer Maternal Aunt        dx 60s/70s, smoker, ulcer   Cancer Maternal Uncle        dx 22s, unknown primary (affected eye/head, colon, stomach), chewed  tobacco   Gout Paternal Uncle    Seizures Paternal Uncle    Lung cancer Maternal Aunt        dx 50s, non smoker   Cancer Maternal Aunt        dx 31s, mouth cancer, chewed tobacco   Skin cancer Maternal Uncle    Skin cancer Maternal Uncle    Breast cancer Niece 36   Cancer Niece        unknown type, dx 25s   Lung cancer Cousin  dx late 46s (maternal first cousin)   Skin cancer Cousin        multiple (maternal first cousin)   Colon cancer Neg Hx    Ulcers Neg Hx    Social History   Tobacco Use   Smoking status: Former    Packs/day: 0.25    Years: 40.00    Total pack years: 10.00    Types: Cigarettes    Start date: 09/25/1973    Quit date: 05/17/2020    Years since quitting: 2.5   Smokeless tobacco: Never  Vaping Use   Vaping Use: Never used  Substance Use Topics   Alcohol use: No   Drug use: No    Allergies  Allergen Reactions   Venofer [Iron Sucrose] Other (See Comments)    Patient had a hypersensitivity reaction to Venofer.  See progress note from 04/25/21.   Feraheme [Ferumoxytol] Swelling and Rash   Penicillins Hives    Has patient had a PCN reaction causing immediate rash, facial/tongue/throat swelling, SOB or lightheadedness with hypotension: No Has patient had a PCN reaction causing severe rash involving mucus membranes or skin necrosis: Yes Has patient had a PCN reaction that required hospitalization: No Has patient had a PCN reaction occurring within the last 10 years: No If all of the above answers are "NO", then may proceed with Cephalosporin use.    Morphine And Related Itching          Current Meds  Medication Sig   atorvastatin (LIPITOR) 20 MG tablet Take 0.5 tablets (10 mg total) by mouth daily.   Cholecalciferol (VITAMIN D) 50 MCG (2000 UT) CAPS Take by mouth.   desvenlafaxine (PRISTIQ) 50 MG 24 hr tablet Take 1 tablet (50 mg total) by mouth daily.   levothyroxine (SYNTHROID) 75 MCG tablet Take 1 tablet (75 mcg total) by mouth daily.    LINZESS 290 MCG CAPS capsule TAKE 1 CAPSULE BY MOUTH EVERY DAY   meclizine (ANTIVERT) 25 MG tablet Take 1 tablet (25 mg total) by mouth 3 (three) times daily as needed for dizziness.   Omega-3 Fatty Acids (FISH OIL) 1000 MG CAPS Take 2,000 mg by mouth daily.   Turmeric (QC TUMERIC COMPLEX PO) Take 3,000 mg by mouth once. Once daily   vitamin B-12 (CYANOCOBALAMIN) 250 MCG tablet Take 250 mcg by mouth daily.    Objective: BP (!) 146/61   Pulse 64   Ht '5\' 1"'$  (1.549 m)   Wt 143 lb 9.6 oz (65.1 kg)   BMI 27.13 kg/m   Physical Exam:  General: Alert and oriented. and No acute distress. Gait: Normal gait.  Bilateral shoulders without deformity.  No atrophy is appreciated.  Tenderness to palpation over the anterior right shoulder.  Symmetric forward elevation.  Symmetric internal rotation to T12, with some pain in the right shoulder.  Positive Jobe's.  External rotation at her side is symmetric.  Fingers are warm and well-perfused.  IMAGING: I personally ordered and reviewed the following images   X-rays of bilateral shoulders were obtained in clinic today.  No acute injuries are noted.  Well-positioned glenohumeral joint.  Well-maintained glenohumeral joint space.  No evidence of proximal humeral migration.  No calcium deposits at the footprint of the rotator cuff.  No bony lesions.  Impression: Negative bilateral shoulder x-rays   New Medications:  No orders of the defined types were placed in this encounter.     Mordecai Rasmussen, MD  12/01/2022 9:02 AM

## 2022-12-01 NOTE — Patient Instructions (Signed)

## 2023-01-11 ENCOUNTER — Other Ambulatory Visit: Payer: Self-pay | Admitting: Family Medicine

## 2023-01-11 DIAGNOSIS — J302 Other seasonal allergic rhinitis: Secondary | ICD-10-CM

## 2023-01-15 ENCOUNTER — Other Ambulatory Visit: Payer: Self-pay | Admitting: Gastroenterology

## 2023-01-18 ENCOUNTER — Other Ambulatory Visit: Payer: Self-pay

## 2023-01-18 DIAGNOSIS — E038 Other specified hypothyroidism: Secondary | ICD-10-CM

## 2023-01-18 MED ORDER — LEVOTHYROXINE SODIUM 75 MCG PO TABS: 75.0000 ug | ORAL_TABLET | Freq: Every day | ORAL | 2 refills | Status: DC

## 2023-02-12 ENCOUNTER — Encounter: Payer: Self-pay | Admitting: Family Medicine

## 2023-02-12 ENCOUNTER — Ambulatory Visit: Payer: Medicaid Other | Admitting: Family Medicine

## 2023-02-12 ENCOUNTER — Other Ambulatory Visit: Payer: Self-pay | Admitting: Family Medicine

## 2023-02-12 VITALS — BP 129/78 | HR 77 | Ht 61.0 in | Wt 136.0 lb

## 2023-02-12 DIAGNOSIS — N941 Unspecified dyspareunia: Secondary | ICD-10-CM | POA: Insufficient documentation

## 2023-02-12 DIAGNOSIS — E7849 Other hyperlipidemia: Secondary | ICD-10-CM

## 2023-02-12 DIAGNOSIS — E559 Vitamin D deficiency, unspecified: Secondary | ICD-10-CM | POA: Diagnosis not present

## 2023-02-12 DIAGNOSIS — R7301 Impaired fasting glucose: Secondary | ICD-10-CM

## 2023-02-12 DIAGNOSIS — E038 Other specified hypothyroidism: Secondary | ICD-10-CM | POA: Diagnosis not present

## 2023-02-12 DIAGNOSIS — H9201 Otalgia, right ear: Secondary | ICD-10-CM | POA: Diagnosis not present

## 2023-02-12 DIAGNOSIS — L920 Granuloma annulare: Secondary | ICD-10-CM

## 2023-02-12 MED ORDER — MOMETASONE FUROATE 0.1 % EX CREA: 1.0000 | TOPICAL_CREAM | Freq: Every day | CUTANEOUS | 1 refills | Status: DC | PRN

## 2023-02-12 MED ORDER — CLOBETASOL PROPIONATE 0.05 % EX OINT
TOPICAL_OINTMENT | CUTANEOUS | 0 refills | Status: DC
Start: 2023-02-12 — End: 2023-04-25

## 2023-02-12 MED ORDER — PREDNISOLONE ACETATE 1 % OP SUSP: 4.0000 [drp] | Freq: Two times a day (BID) | OPHTHALMIC | 2 refills | Status: DC | PRN

## 2023-02-12 NOTE — Assessment & Plan Note (Signed)
Stable on Lipitor 10mg daily

## 2023-02-12 NOTE — Assessment & Plan Note (Signed)
Chronic condition Complains of dyspareunia with deep penetration Symptoms only relief with cessation of intercourse History of uterine malignancy, status post hysterectomy Transvaginal ultrasound in 2016 showed a stable simple appearing right ovarian cyst measuring 14 mm in maximal dimension Will get imaging states to follow-up on the ovarian cyst Referral placed to GYN

## 2023-02-12 NOTE — Assessment & Plan Note (Signed)
Stable on Synthroid 75 mcg daily Pending thyroid levels today Lab Results  Component Value Date   TSH 1.390 11/10/2022

## 2023-02-12 NOTE — Assessment & Plan Note (Signed)
Reports a recent insect bite at the superior crus No redness, warmth noted Mild tenderness with palpation Complains of pruritus at the affected site Encouraged to apply mometasone furoate topically as needed Hearing is intact Tympanic membranes pearly gray with no obstruction noted

## 2023-02-12 NOTE — Progress Notes (Addendum)
Established Patient Office Visit  Subjective:  Patient ID: Kathleen Erickson, female    DOB: 13-Jul-1961  Age: 62 y.o. MRN: [id number]  CC:  Chief Complaint  Patient presents with   Chronic Care Management    4 month f/u.    Ear Pain    Pt reports an insect bite on her right ear noticed it on 02/10/23.     HPI Kathleen Erickson is a 62 y.o. female with past medical history of hypothyroidism, and hyperlipidemia presents with complaints of right ear pain and dyspareunia. For the details of today's visit, please refer to the assessment and plan.     Past Medical History:  Diagnosis Date   Allergy    Anemia    thalcemia   Anxiety    Arthritis    Cancer (HCC)    Cervical cancer   COPD (chronic obstructive pulmonary disease) (HCC)    Depression    Dyspnea    Family history of breast cancer    Family history of lung cancer    Family history of ovarian cancer    Family history of skin cancer    GERD (gastroesophageal reflux disease)    Hyperlipidemia    Hypothyroidism    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HCC)    patient report    Past Surgical History:  Procedure Laterality Date   BILATERAL CARPAL TUNNEL RELEASE Bilateral    at least 10 years ago   BIOPSY  08/22/2021   Procedure: BIOPSY;  Surgeon: Corbin Ade, MD;  Location: [facility];  Service: Endoscopy;;   carpel tunnel release     bilaterally   CESAREAN SECTION     COLONOSCOPY N/A 06/22/2014   Dr. Rourk:External and internal hemorrhoids-grade 4; otherwise normal rectum total colon and terminal ileum.   COLONOSCOPY WITH PROPOFOL N/A 11/26/2017   external and internal hemorrhoids, prominent anal papilla, normal rectum and colon, normal TI.    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   COLPOSCOPY     ESOPHAGOGASTRODUODENOSCOPY N/A  06/22/2014   Dr. Jena Gauss:The mucosa of the esophagus appeared normal  Single gastric ulcer ranging between 3-5 mm in size was found   Biopsy with negative H.pylori.    ESOPHAGOGASTRODUODENOSCOPY N/A 10/28/2014   Dr. Jena Gauss: patulous EG junction, small hiatal hernia, previous noted gastric ulcer healed   ESOPHAGOGASTRODUODENOSCOPY (EGD) WITH PROPOFOL N/A 11/26/2017   normal esophagus, normal stomach, normal duodenum.    ESOPHAGOGASTRODUODENOSCOPY (EGD) WITH PROPOFOL N/A 08/22/2021   normal esophagus, normal stomach (minimal polypoid mucosa), normal duodenal bulb, subtle finding somewhat suspicious for hemobilia or pancreaticus. Pathology revealed reactive gastropathy and negative for H. pylori.   GIVENS CAPSULE STUDY N/A 02/14/2021   Procedure: GIVENS CAPSULE STUDY;  Surgeon: Corbin Ade, MD;  Location: [facility];  Service: Endoscopy;  Laterality: N/A;  7:30am   HEMOSTASIS CLIP PLACEMENT  08/22/2021   Procedure: HEMOSTASIS CLIP PLACEMENT;  Surgeon: Corbin Ade, MD;  Location: [facility];  Service: Endoscopy;;   HOT HEMOSTASIS  08/22/2021   Procedure: HOT HEMOSTASIS (ARGON PLASMA COAGULATION/BICAP);  Surgeon: Corbin Ade, MD;  Location: [facility];  Service: Endoscopy;;   VAGINAL HYSTERECTOMY     bleeding; she thinks it was a partial and has ovaries in place    Family History  Problem Relation Age of  Onset   Uterine cancer Mother    Heart disease Mother    Alcohol abuse Mother    Arthritis Mother    COPD Mother    Depression Mother    Ovarian cancer Mother        dx 42s   Heart disease Father        age 6, family questions autopsy   Alcohol abuse Father    Arthritis Father    Depression Father    Early death Father    Cancer Other        ulcer, aunt   Lung cancer Cousin        dx 74s, then again in her 65s (maternal first cousin)   Throat cancer Cousin    Asthma Son    COPD Sister    Lung cancer Maternal Aunt        dx 60s/70s, smoker, ulcer   Cancer  Maternal Uncle        dx 20s, unknown primary (affected eye/head, colon, stomach), chewed tobacco   Gout Paternal Uncle    Seizures Paternal Uncle    Lung cancer Maternal Aunt        dx 63s, non smoker   Cancer Maternal Aunt        dx 51s, mouth cancer, chewed tobacco   Skin cancer Maternal Uncle    Skin cancer Maternal Uncle    Breast cancer Niece 52   Cancer Niece        unknown type, dx 30s   Lung cancer Cousin        dx late 73s (maternal first cousin)   Skin cancer Cousin        multiple (maternal first cousin)   Colon cancer Neg Hx    Ulcers Neg Hx     Social History   Socioeconomic History   Marital status: Legally Separated    Spouse name: Iantha Fallen   Number of children: 2   Years of education: 10   Highest education level: Not on file  Occupational History   Occupation: not working    Comment: cleaned houses    Comment: disabled from arthritis  Tobacco Use   Smoking status: Former    Packs/day: 0.25    Years: 40.00    Additional pack years: 0.00    Total pack years: 10.00    Types: Cigarettes    Start date: 09/25/1973    Quit date: 05/17/2020    Years since quitting: 2.7   Smokeless tobacco: Never  Vaping Use   Vaping Use: Never used  Substance and Sexual Activity   Alcohol use: No   Drug use: No   Sexual activity: Yes    Birth control/protection: None  Other Topics Concern   Not on file  Social History Narrative   Lives alone , has family around her.    Social Determinants of Health   Financial Resource Strain: Not on file  Food Insecurity: Not on file  Transportation Needs: Not on file  Physical Activity: Not on file  Stress: Not on file  Social Connections: Not on file  Intimate Partner Violence: Not on file    Outpatient Medications Prior to Visit  Medication Sig Dispense Refill   albuterol (VENTOLIN HFA) 108 (90 Base) MCG/ACT inhaler TAKE 2 PUFFS BY MOUTH EVERY 6 HOURS AS NEEDED FOR WHEEZE OR SHORTNESS OF BREATH 18 each 3   atorvastatin  (LIPITOR) 20 MG tablet Take 0.5 tablets (10 mg total) by mouth daily. 90 tablet 2  bisacodyl (GENTLE LAXATIVE) 5 MG EC tablet Take 5 mg by mouth daily as needed for moderate constipation.     Cholecalciferol (VITAMIN D) 50 MCG (2000 UT) CAPS Take by mouth.     desvenlafaxine (PRISTIQ) 50 MG 24 hr tablet Take 1 tablet (50 mg total) by mouth daily. 90 tablet 1   hydrocortisone (ANUSOL-HC) 2.5 % rectal cream PLACE 1 APPLICATION RECTALLY 2 (TWO) TIMES DAILY. 30 g 1   levothyroxine (SYNTHROID) 75 MCG tablet Take 1 tablet (75 mcg total) by mouth daily. 30 tablet 2   LINZESS 290 MCG CAPS capsule TAKE 1 CAPSULE BY MOUTH EVERY DAY 30 capsule 5   meclizine (ANTIVERT) 25 MG tablet Take 1 tablet (25 mg total) by mouth 3 (three) times daily as needed for dizziness. 90 tablet 2   Omega-3 Fatty Acids (FISH OIL) 1000 MG CAPS Take 2,000 mg by mouth daily.     Turmeric (QC TUMERIC COMPLEX PO) Take 3,000 mg by mouth once. Once daily     UNABLE TO FIND Take 1 Piece of gum by mouth daily. Med Name: SKIN & GUT essential probiotic     UNABLE TO FIND Med Name: GOLO     clobetasol ointment (TEMOVATE) 0.05 % APPLY TO AFFECTED AREA TWICE A DAY 30 g 0   mometasone (ELOCON) 0.1 % cream Apply 1 application topically daily as needed (dry ear).     estradiol (ESTRACE) 2 MG tablet TAKE 1 TABLET BY MOUTH EVERY DAY (Patient taking differently: Take 2 mg by mouth daily.) 90 tablet 1   vitamin B-12 (CYANOCOBALAMIN) 250 MCG tablet Take 250 mcg by mouth daily. (Patient not taking: Reported on 02/12/2023)     methylPREDNISolone (MEDROL DOSEPAK) 4 MG TBPK tablet Take as the package instructed (Patient not taking: Reported on 12/01/2022) 1 each 0   No facility-administered medications prior to visit.    Allergies  Allergen Reactions   Venofer [Iron Sucrose] Other (See Comments)    Patient had a hypersensitivity reaction to Venofer.  See progress note from 04/25/21.   Feraheme [Ferumoxytol] Swelling and Rash   Penicillins Hives    Has  patient had a PCN reaction causing immediate rash, facial/tongue/throat swelling, SOB or lightheadedness with hypotension: No Has patient had a PCN reaction causing severe rash involving mucus membranes or skin necrosis: Yes Has patient had a PCN reaction that required hospitalization: No Has patient had a PCN reaction occurring within the last 10 years: No If all of the above answers are "NO", then may proceed with Cephalosporin use.    Morphine And Codeine Itching          ROS Review of Systems  Constitutional:  Negative for chills and fever.  HENT:  Positive for ear pain.   Eyes:  Negative for visual disturbance.  Respiratory:  Negative for chest tightness and shortness of breath.   Genitourinary:  Positive for dyspareunia.  Neurological:  Negative for dizziness and headaches.      Objective:    Physical Exam HENT:     Head: Normocephalic.     Right Ear: Tympanic membrane, ear canal and external ear normal. There is no impacted cerumen.     Left Ear: Tympanic membrane, ear canal and external ear normal. There is no impacted cerumen.     Mouth/Throat:     Mouth: Mucous membranes are moist.  Cardiovascular:     Rate and Rhythm: Normal rate.     Heart sounds: Normal heart sounds.  Pulmonary:     Effort:  Pulmonary effort is normal.     Breath sounds: Normal breath sounds.  Neurological:     Mental Status: She is alert.     BP 129/78   Pulse 77   Ht 5\' 1"  (1.549 m)   Wt 136 lb 0.6 oz (61.7 kg)   SpO2 95%   BMI 25.70 kg/m  Wt Readings from Last 3 Encounters:  02/12/23 136 lb 0.6 oz (61.7 kg)  12/01/22 143 lb 9.6 oz (65.1 kg)  10/13/22 144 lb 1.9 oz (65.4 kg)    Lab Results  Component Value Date   TSH 1.390 11/10/2022   Lab Results  Component Value Date   WBC 5.3 10/13/2022   HGB 11.6 10/13/2022   HCT 37.9 10/13/2022   MCV 63 (L) 10/13/2022   PLT 272 10/13/2022   Lab Results  Component Value Date   NA 141 10/13/2022   K 4.9 10/13/2022   CO2 24  10/13/2022   GLUCOSE 93 10/13/2022   BUN 16 10/13/2022   CREATININE 0.77 10/13/2022   BILITOT 0.4 10/13/2022   ALKPHOS 86 10/13/2022   AST 29 10/13/2022   ALT 25 10/13/2022   PROT 7.0 10/13/2022   ALBUMIN 4.9 10/13/2022   CALCIUM 9.2 10/13/2022   ANIONGAP 9 02/11/2020   EGFR 87 10/13/2022   Lab Results  Component Value Date   CHOL 228 (H) 10/13/2022   Lab Results  Component Value Date   HDL 80 10/13/2022   Lab Results  Component Value Date   LDLCALC 136 (H) 10/13/2022   Lab Results  Component Value Date   TRIG 70 10/13/2022   Lab Results  Component Value Date   CHOLHDL 2.9 10/13/2022   Lab Results  Component Value Date   HGBA1C 5.6 10/13/2022      Assessment & Plan:  Dyspareunia in female Assessment & Plan: Chronic condition Complains of dyspareunia with deep penetration Symptoms only relief with cessation of intercourse History of uterine malignancy, status post hysterectomy Transvaginal ultrasound in 2016 showed a stable simple appearing right ovarian cyst measuring 14 mm in maximal dimension Will get imaging states to follow-up on the ovarian cyst Referral placed to GYN  Orders: -     US PELVIC COMPLETE WITH TRANSVAGINAL -     Ambulatory referral to Obstetrics / Gynecology  Right ear pain Assessment & Plan: Reports a recent insect bite at the superior crus No redness, warmth noted Mild tenderness with palpation Complains of pruritus at the affected site Encouraged to apply mometasone furoate topically as needed Hearing is intact Tympanic membranes pearly gray with no obstruction noted  Orders: -     prednisoLONE Acetate; Place 4 drops into both eyes 2 (two) times daily as needed. Administer 4 drops into each ear twice daily as needed for itching.  Dispense: 5 mL; Refill: 2 -     Mometasone Furoate; Apply 1 Application topically daily as needed (dry ear).  Dispense: 45 g; Refill: 1  Other hyperlipidemia Assessment & Plan: Stable on Lipitor 10  mg daily  Orders: -     Lipid panel -     CMP14+EGFR -     CBC with Differential/Platelet  IFG (impaired fasting glucose) -     Hemoglobin A1c  Vitamin D deficiency -     VITAMIN D 25 Hydroxy (Vit-D Deficiency, Fractures)  Other specified hypothyroidism Assessment & Plan: Stable on Synthroid 75 mcg daily Pending thyroid levels today Lab Results  Component Value Date   TSH 1.390 11/10/2022  Orders: -     TSH + free T4  Granuloma annulare -     Clobetasol Propionate; APPLY TO AFFECTED AREA TWICE A DAY  Dispense: 30 g; Refill: 0   Note: This chart has been completed using [employer] software, and while attempts have been made to ensure accuracy, certain words and phrases may not be transcribed as intended.    Follow-up: Return in about 1 month (around 03/15/2023).   Gilmore Laroche, FNP

## 2023-02-12 NOTE — Patient Instructions (Signed)
I appreciate the opportunity to provide care to you today!    Follow up:  1 month  Labs: please stop by the lab today to get your blood drawn (CBC, CMP, TSH, Lipid profile, HgA1c, Vit D)   Please stop by [facility] hospital to get an Transvaginal U/S  Please stop by your local pharmacy and get your Tdap and Shingles vaccine  Referrals today-  [facility]   Please continue to a heart-healthy diet and increase your physical activities. Try to exercise for at least five days a week.      It was a pleasure to see you and I look forward to continuing to work together on your health and well-being. Please do not hesitate to call the office if you need care or have questions about your care.   Have a wonderful day and week. With Gratitude, Gilmore Laroche MSN, FNP-BC

## 2023-02-26 ENCOUNTER — Ambulatory Visit (HOSPITAL_COMMUNITY)
Admission: RE | Admit: 2023-02-26 | Discharge: 2023-02-26 | Disposition: A | Discharge status: Home / Self Care | Payer: Medicaid Other | Source: Ambulatory Visit | Attending: Family Medicine | Admitting: Family Medicine

## 2023-02-26 DIAGNOSIS — E559 Vitamin D deficiency, unspecified: Secondary | ICD-10-CM | POA: Diagnosis not present

## 2023-02-26 DIAGNOSIS — N83291 Other ovarian cyst, right side: Secondary | ICD-10-CM | POA: Diagnosis not present

## 2023-02-26 DIAGNOSIS — N941 Unspecified dyspareunia: Secondary | ICD-10-CM | POA: Insufficient documentation

## 2023-02-26 DIAGNOSIS — R7301 Impaired fasting glucose: Secondary | ICD-10-CM | POA: Diagnosis not present

## 2023-02-26 DIAGNOSIS — E038 Other specified hypothyroidism: Secondary | ICD-10-CM | POA: Diagnosis not present

## 2023-02-26 DIAGNOSIS — E7849 Other hyperlipidemia: Secondary | ICD-10-CM | POA: Diagnosis not present

## 2023-02-27 ENCOUNTER — Other Ambulatory Visit: Payer: Self-pay | Admitting: Family Medicine

## 2023-02-27 DIAGNOSIS — E038 Other specified hypothyroidism: Secondary | ICD-10-CM

## 2023-02-27 LAB — CBC WITH DIFFERENTIAL/PLATELET
Basophils Absolute: 0 10*3/uL (ref 0.0–0.2)
Basos: 1 %
EOS (ABSOLUTE): 0.2 10*3/uL (ref 0.0–0.4)
Eos: 4 %
Hematocrit: 37.2 % (ref 34.0–46.6)
Hemoglobin: 11.1 g/dL (ref 11.1–15.9)
Immature Grans (Abs): 0 10*3/uL (ref 0.0–0.1)
Immature Granulocytes: 0 %
Lymphocytes Absolute: 1.4 10*3/uL (ref 0.7–3.1)
Lymphs: 35 %
MCH: 19 pg — ABNORMAL LOW (ref 26.6–33.0)
MCHC: 29.8 g/dL — ABNORMAL LOW (ref 31.5–35.7)
MCV: 64 fL — ABNORMAL LOW (ref 79–97)
Monocytes Absolute: 0.4 10*3/uL (ref 0.1–0.9)
Monocytes: 9 %
Neutrophils Absolute: 2.1 10*3/uL (ref 1.4–7.0)
Neutrophils: 51 %
Platelets: 264 10*3/uL (ref 150–450)
RBC: 5.85 x10E6/uL — ABNORMAL HIGH (ref 3.77–5.28)
RDW: 16.7 % — ABNORMAL HIGH (ref 11.7–15.4)
WBC: 4 10*3/uL (ref 3.4–10.8)

## 2023-02-27 LAB — CMP14+EGFR
ALT: 13 IU/L (ref 0–32)
AST: 18 IU/L (ref 0–40)
Albumin/Globulin Ratio: 2.2 (ref 1.2–2.2)
Albumin: 4.7 g/dL (ref 3.9–4.9)
Alkaline Phosphatase: 84 IU/L (ref 44–121)
BUN/Creatinine Ratio: 21 (ref 12–28)
BUN: 13 mg/dL (ref 8–27)
Bilirubin Total: 0.3 mg/dL (ref 0.0–1.2)
CO2: 23 mmol/L (ref 20–29)
Calcium: 9.3 mg/dL (ref 8.7–10.3)
Chloride: 104 mmol/L (ref 96–106)
Creatinine, Ser: 0.61 mg/dL (ref 0.57–1.00)
Globulin, Total: 2.1 g/dL (ref 1.5–4.5)
Glucose: 79 mg/dL (ref 70–99)
Potassium: 4.4 mmol/L (ref 3.5–5.2)
Sodium: 143 mmol/L (ref 134–144)
Total Protein: 6.8 g/dL (ref 6.0–8.5)
eGFR: 100 mL/min/{1.73_m2} (ref >59)

## 2023-02-27 LAB — TSH+FREE T4
Free T4: 1.41 ng/dL (ref 0.82–1.77)
TSH: 0.312 u[IU]/mL — ABNORMAL LOW (ref 0.450–4.500)

## 2023-02-27 LAB — LIPID PANEL
Chol/HDL Ratio: 2.5 ratio (ref 0.0–4.4)
Cholesterol, Total: 181 mg/dL (ref 100–199)
HDL: 73 mg/dL (ref >39)
LDL Chol Calc (NIH): 97 mg/dL (ref 0–99)
Triglycerides: 58 mg/dL (ref 0–149)
VLDL Cholesterol Cal: 11 mg/dL (ref 5–40)

## 2023-02-27 LAB — HEMOGLOBIN A1C
Est. average glucose Bld gHb Est-mCnc: 117 mg/dL
Hgb A1c MFr Bld: 5.7 % — ABNORMAL HIGH (ref 4.8–5.6)

## 2023-02-27 LAB — VITAMIN D 25 HYDROXY (VIT D DEFICIENCY, FRACTURES): Vit D, 25-Hydroxy: 47.9 ng/mL (ref 30.0–100.0)

## 2023-02-27 NOTE — Progress Notes (Signed)
Please encouraged the patient to return for labs to assess her thyroid levels in 6 weeks on April 10, 2023.  Her TSH is slightly low.  Her hemoglobin A1c is 5.7, this indicates that she is prediabetic. I recommend avoiding simple carbohydrates, including cakes, sweet desserts, ice cream, soda (diet or regular), sweet tea, candies, chips, cookies, store-bought juices, alcohol in excess of 1-2 drinks a day, lemonade, artificial sweeteners, donuts, coffee creamers, and sugar-free products.  I recommend avoiding greasy, fatty foods with increased physical activity.

## 2023-03-03 NOTE — Progress Notes (Signed)
Please inform the patient that her imaging study showed no acute findings.  Normal ovary and adnexal was noted with a benign 2 cm right ovarian cyst. No follow up is indicated and there were no masses seen.

## 2023-03-12 ENCOUNTER — Ambulatory Visit: Payer: Medicaid Other | Admitting: Obstetrics and Gynecology

## 2023-03-12 ENCOUNTER — Encounter: Payer: Self-pay | Admitting: Obstetrics and Gynecology

## 2023-03-12 VITALS — BP 119/68 | HR 69 | Ht 61.0 in | Wt 136.8 lb

## 2023-03-12 DIAGNOSIS — N941 Unspecified dyspareunia: Secondary | ICD-10-CM | POA: Diagnosis not present

## 2023-03-12 MED ORDER — NITROFURANTOIN MONOHYD MACRO 100 MG PO CAPS
ORAL_CAPSULE | ORAL | 0 refills | Status: DC
Start: 2023-03-12 — End: 2023-04-26

## 2023-03-12 NOTE — Progress Notes (Signed)
Ms Bangs presents with 10 yr H/O dyspareunia. Pain is usually with deep penetration and every position.  She is on ERT. Uses vaginal lubrication as needed, does not make any difference.   S/P vag hyst in the past.  H/O simple ovarian cyst by U/S.  PE AF VSS Chaperone present  Lungs clear Heart RRR Abd soft + BS GU Nl EGBUS, mild vaginal atrophy noted, cervix and uterus surgerical absent. + bladder tenderness, reproduces pt's pain.  A/P Dyspareunia  Suspect pt's Sx are related to chronic cystitis. Reviewed with pt. Will treat with Macrobid bid x 7 days and then at bedtime x 21 days. F/U in 6-8 weeks

## 2023-03-15 ENCOUNTER — Ambulatory Visit: Payer: Medicaid Other | Admitting: Family Medicine

## 2023-04-06 DIAGNOSIS — E038 Other specified hypothyroidism: Secondary | ICD-10-CM | POA: Diagnosis not present

## 2023-04-07 ENCOUNTER — Other Ambulatory Visit: Payer: Self-pay | Admitting: Family Medicine

## 2023-04-07 DIAGNOSIS — E038 Other specified hypothyroidism: Secondary | ICD-10-CM

## 2023-04-07 LAB — TSH+FREE T4
Free T4: 1.5 ng/dL (ref 0.82–1.77)
TSH: 0.085 u[IU]/mL — ABNORMAL LOW (ref 0.450–4.500)

## 2023-04-07 MED ORDER — LEVOTHYROXINE SODIUM 50 MCG PO TABS
50.0000 ug | ORAL_TABLET | Freq: Every day | ORAL | 3 refills | Status: DC
Start: 2023-04-07 — End: 2023-10-16

## 2023-04-07 NOTE — Progress Notes (Signed)
Please inform the pt that I have decreased her synthroid to 50 mcg daily based on her current labs. I recommend not taking synthroid 75 mcg. Her new rx is sent to the pharmacy. I recommend returning for labs to assess her thyroid levels in 6 weeks during the week of May 14, 2023. The order is placed.

## 2023-04-13 ENCOUNTER — Encounter: Payer: Self-pay | Admitting: Family Medicine

## 2023-04-13 ENCOUNTER — Ambulatory Visit (INDEPENDENT_AMBULATORY_CARE_PROVIDER_SITE_OTHER): Payer: Medicaid Other | Admitting: Family Medicine

## 2023-04-13 VITALS — BP 118/62 | HR 65 | Ht 61.0 in | Wt 132.0 lb

## 2023-04-13 DIAGNOSIS — Z0001 Encounter for general adult medical examination with abnormal findings: Secondary | ICD-10-CM | POA: Diagnosis not present

## 2023-04-13 DIAGNOSIS — R42 Dizziness and giddiness: Secondary | ICD-10-CM

## 2023-04-13 NOTE — Progress Notes (Addendum)
Complete physical exam  Patient: Kathleen Erickson   DOB: May 20, 1961   62 y.o. Female  MRN: [id number]  Subjective:    Chief Complaint  Patient presents with   Annual Exam    Cpe today   Dizziness    Pt reports dizziness when dusting around her home started 3 weeks ago.   Leg Pain    Pt reports leg pain, radiating from hip down, also has right ankle pain a little swelling not sure why started 2-3 weeks ago.    Kathleen Erickson is a 62 y.o. female who presents today for a complete physical exam. She reports consuming a general diet.  The patient reports that she walks at least 3 days a week for 30 minutes  She generally feels well. She reports sleeping well. She does have additional problems to discuss today.    Most recent fall risk assessment:    04/13/2023    9:21 AM  Fall Risk   Falls in the past year? 0  Number falls in past yr: 0  Injury with Fall? 0  Risk for fall due to : No Fall Risks  Follow up Falls evaluation completed     Most recent depression screenings:    04/13/2023    9:21 AM 03/12/2023    2:36 PM  PHQ 2/9 Scores  PHQ - 2 Score 0 0  PHQ- 9 Score 0 6    Dental: No current dental problems and Last dental visit: June 2024    Patient Care Team: Gilmore Laroche, FNP as PCP - General (Family Medicine) Wyline Mood, Dorothe Pea, MD as PCP - Cardiology (Cardiology) Corbin Ade, MD as Consulting Physician (Gastroenterology)   Outpatient Medications Prior to Visit  Medication Sig   albuterol (VENTOLIN HFA) 108 (90 Base) MCG/ACT inhaler TAKE 2 PUFFS BY MOUTH EVERY 6 HOURS AS NEEDED FOR WHEEZE OR SHORTNESS OF BREATH   atorvastatin (LIPITOR) 20 MG tablet Take 0.5 tablets (10 mg total) by mouth daily.   bisacodyl (GENTLE LAXATIVE) 5 MG EC tablet Take 5 mg by mouth daily as needed for moderate constipation.   Cholecalciferol (VITAMIN D) 50 MCG (2000 UT) CAPS Take by mouth.   clobetasol ointment (TEMOVATE) 0.05 % APPLY TO AFFECTED AREA TWICE A DAY   desvenlafaxine  (PRISTIQ) 50 MG 24 hr tablet Take 1 tablet (50 mg total) by mouth daily.   hydrocortisone (ANUSOL-HC) 2.5 % rectal cream PLACE 1 APPLICATION RECTALLY 2 (TWO) TIMES DAILY.   levothyroxine (SYNTHROID) 50 MCG tablet Take 1 tablet (50 mcg total) by mouth daily.   LINZESS 290 MCG CAPS capsule TAKE 1 CAPSULE BY MOUTH EVERY DAY   meclizine (ANTIVERT) 25 MG tablet Take 1 tablet (25 mg total) by mouth 3 (three) times daily as needed for dizziness.   mometasone (ELOCON) 0.1 % cream Apply 1 Application topically daily as needed (dry ear).   nitrofurantoin, macrocrystal-monohydrate, (MACROBID) 100 MG capsule Take 1 tablet bid x 7 days and then 1 tablet at bedtime until gone   Omega-3 Fatty Acids (FISH OIL) 1000 MG CAPS Take 2,000 mg by mouth daily.   prednisoLONE acetate (PRED FORTE) 1 % ophthalmic suspension Place 4 drops into both eyes 2 (two) times daily as needed. Administer 4 drops into each ear twice daily as needed for itching.   Turmeric (QC TUMERIC COMPLEX PO) Take 3,000 mg by mouth once. Once daily   UNABLE TO FIND Take 1 Piece of gum by mouth daily. Med Name: SKIN & GUT essential probiotic  UNABLE TO FIND Med Name: GOLO   vitamin B-12 (CYANOCOBALAMIN) 250 MCG tablet Take 250 mcg by mouth daily.   No facility-administered medications prior to visit.    Review of Systems  Constitutional:  Negative for chills, fever and malaise/fatigue.  HENT:  Negative for congestion and sinus pain.   Eyes:  Negative for pain, discharge and redness.  Respiratory:  Negative for cough, sputum production and shortness of breath.   Cardiovascular:  Negative for chest pain, palpitations, claudication and leg swelling.  Gastrointestinal:  Negative for diarrhea, heartburn and nausea.  Genitourinary:  Negative for flank pain and frequency.  Musculoskeletal:  Negative for back pain and joint pain.  Skin:  Negative for itching.  Neurological:  Positive for dizziness. Negative for seizures and headaches.   Endo/Heme/Allergies:  Negative for environmental allergies.  Psychiatric/Behavioral:  Negative for memory loss. The patient does not have insomnia.        Objective:    BP 118/62   Pulse 65   Ht 5\' 1"  (1.549 m)   Wt 132 lb (59.9 kg)   SpO2 95%   BMI 24.94 kg/m    Physical Exam HENT:     Head: Normocephalic.     Left Ear: External ear normal.     Mouth/Throat:     Mouth: Mucous membranes are moist.  Eyes:     Extraocular Movements: Extraocular movements intact.     Pupils: Pupils are equal, round, and reactive to light.  Cardiovascular:     Rate and Rhythm: Normal rate and regular rhythm.     Heart sounds: No murmur heard. Pulmonary:     Effort: Pulmonary effort is normal.     Breath sounds: Normal breath sounds.  Abdominal:     Palpations: Abdomen is soft.     Tenderness: There is no right CVA tenderness or left CVA tenderness.  Musculoskeletal:     Right lower leg: No edema.     Left lower leg: No edema.  Skin:    Findings: No lesion or rash.  Neurological:     Mental Status: She is alert and oriented to person, place, and time.     GCS: GCS eye subscore is 4. GCS verbal subscore is 5. GCS motor subscore is 6.     Cranial Nerves: No facial asymmetry.     Sensory: No sensory deficit.     Motor: No weakness.     Coordination: Coordination normal. Finger-Nose-Finger Test normal.     Gait: Gait normal.  Psychiatric:        Judgment: Judgment normal.     No results found for any visits on 04/13/23.     Assessment & Plan:    Routine Health Maintenance and Physical Exam  Immunization History  Administered Date(s) Administered   Fluad Quad(high Dose 65+) 10/13/2022   Influenza Split 06/29/2015, 06/12/2016, 07/02/2017   Influenza,inj,Quad PF,6+ Mos 07/02/2017, 07/10/2019, 07/06/2020, 08/31/2021   Moderna Covid-19 Vaccine Bivalent Booster 11yrs & up 10/13/2021   Moderna Sars-Covid-2 Vaccination 05/20/2020, 06/25/2020   Pneumococcal Polysaccharide-23  07/06/2020   Tdap 01/31/2017    Health Maintenance  Topic Date Due   Zoster Vaccines- Shingrix (1 of 2) Never done   COVID-19 Vaccine (4 - 2023-24 season) 05/26/2022   INFLUENZA VACCINE  04/26/2023   MAMMOGRAM  10/19/2024   DTaP/Tdap/Td (2 - Td or Tdap) 02/01/2027   Colonoscopy  08/23/2031   HIV Screening  Completed   HPV VACCINES  Aged Out   PAP SMEAR-Modifier  Discontinued  Hepatitis C Screening  Discontinued    Discussed health benefits of physical activity, and encouraged her to engage in regular exercise appropriate for her age and condition.  Encounter for annual general medical examination with abnormal findings in adult Assessment & Plan: Physical exam as documented Discussed heart-healthy diet  Encouraged to Exercise: If you are able: 30 -60 minutes a day ,4 days a week, or 150 minutes a week. The longer the better. Combine stretch, strength, and aerobic activities Encourage to eat whole Food, Plant Predominant Nutrition is highly recommended: Eat Plenty of vegetables, Mushrooms, fruits, Legumes, Whole Grains, Nuts, seeds in lieu of processed meats, processed snacks/pastries red meat, poultry, eggs.  Will f/u in 1 year for CPE      Dizziness Assessment & Plan: History of vertigo for which she takes meclizine 25 mg as needed She reports feeling dizzy when she looks up No spinning sensation reported No nystagmus no reported No tinnitus reported No history of otitis media recently No syncope episodes with the dizziness No reports of vomiting, diplopia, slurred speech, numbness of the face or body, weakness, or incoordination with the dizziness She admits to minimal fluid intake Encouraged to increase her fluid consumption at least 64 ounces daily and continue taking meclizine Will follow-up in 2 weeks    Note: This chart has been completed using [employer] software, and while attempts have been made to ensure accuracy, certain words and phrases may  not be transcribed as intended.    Return in about 2 weeks (around 04/27/2023) for leg pain and dizziness.     Gilmore Laroche, FNP

## 2023-04-13 NOTE — Patient Instructions (Addendum)
I appreciate the opportunity to provide care to you today!    Follow up:  2 weeks to address dizziness and leg pain -please increase your fluid intake to at least 64 ounces daily   Attached with your AVS, you will find valuable resources for self-education. I highly recommend dedicating some time to thoroughly examine them.   Please continue to a heart-healthy diet and increase your physical activities. Try to exercise for at least five days a week.    It was a pleasure to see you and I look forward to continuing to work together on your health and well-being. Please do not hesitate to call the office if you need care or have questions about your care.  In case of emergency, please visit the Emergency Department for urgent care, or contact our clinic at [phone] to schedule an appointment. We're here to help you!   Have a wonderful day and week. With Gratitude, Gilmore Laroche MSN, FNP-BC

## 2023-04-13 NOTE — Assessment & Plan Note (Signed)

## 2023-04-13 NOTE — Assessment & Plan Note (Addendum)
History of vertigo for which she takes meclizine 25 mg as needed She reports feeling dizzy when she looks up No spinning sensation reported No nystagmus no reported No tinnitus reported No history of otitis media recently No syncope episodes with the dizziness No reports of vomiting, diplopia, slurred speech, numbness of the face or body, weakness, or incoordination with the dizziness She admits to minimal fluid intake Encouraged to increase her fluid consumption at least 64 ounces daily and continue taking meclizine Will follow-up in 2 weeks

## 2023-04-25 ENCOUNTER — Other Ambulatory Visit: Payer: Self-pay | Admitting: Obstetrics and Gynecology

## 2023-04-25 ENCOUNTER — Other Ambulatory Visit: Payer: Self-pay | Admitting: Family Medicine

## 2023-04-25 DIAGNOSIS — F419 Anxiety disorder, unspecified: Secondary | ICD-10-CM

## 2023-04-25 DIAGNOSIS — N941 Unspecified dyspareunia: Secondary | ICD-10-CM

## 2023-04-25 DIAGNOSIS — L920 Granuloma annulare: Secondary | ICD-10-CM

## 2023-04-25 DIAGNOSIS — E038 Other specified hypothyroidism: Secondary | ICD-10-CM

## 2023-04-27 ENCOUNTER — Ambulatory Visit: Payer: Medicaid Other | Admitting: Adult Health

## 2023-04-29 ENCOUNTER — Other Ambulatory Visit: Payer: Self-pay | Admitting: Family Medicine

## 2023-04-29 DIAGNOSIS — H9201 Otalgia, right ear: Secondary | ICD-10-CM

## 2023-04-29 DIAGNOSIS — L920 Granuloma annulare: Secondary | ICD-10-CM

## 2023-05-01 ENCOUNTER — Ambulatory Visit: Payer: Medicaid Other | Admitting: Family Medicine

## 2023-05-21 ENCOUNTER — Ambulatory Visit: Payer: Medicaid Other | Admitting: Family Medicine

## 2023-05-22 ENCOUNTER — Encounter: Payer: Self-pay | Admitting: Family Medicine

## 2023-06-06 ENCOUNTER — Telehealth: Payer: Self-pay

## 2023-06-06 NOTE — Telephone Encounter (Signed)
  Medicaid Managed Care   Unsuccessful Outreach Note  06/06/2023 Name: CYLEIGH CIONI MRN: [id number] DOB: December 30, 1960  Referred by: Gilmore Laroche, FNP Reason for referral : No chief complaint on file.   An unsuccessful telephone outreach was attempted today. The patient was referred to the case management team for assistance with care management and care coordination.   Follow Up Plan: If patient returns call to provider office, please advise to call Embedded Care Management Care Guide Nicholes Rough* at [phone]*  Nicholes Rough, CMA Care Guide [employer]

## 2023-06-08 ENCOUNTER — Encounter: Payer: Self-pay | Admitting: Family Medicine

## 2023-06-08 ENCOUNTER — Ambulatory Visit: Payer: Medicaid Other | Admitting: Family Medicine

## 2023-06-08 ENCOUNTER — Ambulatory Visit (HOSPITAL_COMMUNITY)
Admission: RE | Admit: 2023-06-08 | Discharge: 2023-06-08 | Disposition: A | Discharge status: Home / Self Care | Payer: Medicaid Other | Source: Ambulatory Visit | Attending: Family Medicine | Admitting: Family Medicine

## 2023-06-08 VITALS — BP 116/75 | HR 73 | Ht 61.0 in | Wt 135.0 lb

## 2023-06-08 DIAGNOSIS — Z23 Encounter for immunization: Secondary | ICD-10-CM | POA: Diagnosis not present

## 2023-06-08 DIAGNOSIS — E038 Other specified hypothyroidism: Secondary | ICD-10-CM

## 2023-06-08 DIAGNOSIS — R7301 Impaired fasting glucose: Secondary | ICD-10-CM

## 2023-06-08 DIAGNOSIS — M25552 Pain in left hip: Secondary | ICD-10-CM | POA: Insufficient documentation

## 2023-06-08 DIAGNOSIS — M8580 Other specified disorders of bone density and structure, unspecified site: Secondary | ICD-10-CM | POA: Diagnosis not present

## 2023-06-08 DIAGNOSIS — E7849 Other hyperlipidemia: Secondary | ICD-10-CM

## 2023-06-08 DIAGNOSIS — E559 Vitamin D deficiency, unspecified: Secondary | ICD-10-CM

## 2023-06-08 DIAGNOSIS — M549 Dorsalgia, unspecified: Secondary | ICD-10-CM | POA: Diagnosis not present

## 2023-06-08 MED ORDER — CYCLOBENZAPRINE HCL 5 MG PO TABS
5.0000 mg | ORAL_TABLET | Freq: Three times a day (TID) | ORAL | 1 refills | Status: DC | PRN
Start: 2023-06-08 — End: 2023-12-26

## 2023-06-08 MED ORDER — NAPROXEN 500 MG PO TABS
500.0000 mg | ORAL_TABLET | Freq: Two times a day (BID) | ORAL | 0 refills | Status: AC
Start: 2023-06-08 — End: 2023-06-15

## 2023-06-08 NOTE — Assessment & Plan Note (Signed)
An X-ray will be ordered to rule out left hip pathology.  Treatment Plan:Naproxen: Start taking naproxen 500 mg twice daily for 7 days. Flexeril: Additionally, take Flexeril 5 mg twice daily as needed for muscle pain and spasms. It is recommended to take Flexeril at night, as it may cause drowsiness. Avoid taking it during activities requiring mental alertness. Heat and Cold Therapy:Apply cold packs to reduce inflammation and numb the affected area. Use heat therapy, such as a warm compress or heating pad, to relax muscles and improve blood flow. Activity: Avoid prolonged inactivity, as it can lead to stiffness and muscle weakness. Gradual movement and activity are encouraged to promote healing. Exercise: Engage in targeted exercises focusing on stretching and strengthening the muscles that support the lower back and core. Follow up with orthopedic surgery if your symptoms do not improve with the current treatment regimen.

## 2023-06-08 NOTE — Patient Instructions (Addendum)
I appreciate the opportunity to provide care to you today!    Follow up:  4 months  Labs: please stop by the lab today to get your blood drawn (CBC, CMP, TSH, Lipid profile, HgA1c, Vit D)  Sciatica -Please start taking naproxen 500 mg twice daily for 7 days. -Additionally, take Flexeril 5 mg three times daily as needed for muscle pain and muscle spasms. It is recommended to take Flexeril at night, as it may cause drowsiness. Avoid taking it during activities that require mental alertness. -Heat and Cold Therapy:Apply cold packs to reduce inflammation and numb the affected area. -Use heat therapy, such as a warm compress or heating pad, to relax muscles and improve blood flow. -Avoid prolonged inactivity, as this can lead to stiffness and muscle weakness. Gradual movement and activity are encouraged to promote healing. -Engage in targeted exercises focusing on stretching and strengthening the muscles that support the lower back and core.  Follow up with orthopedic surgery if your symptoms do not improve with the current treatment regimen.  Attached with your AVS, you will find valuable resources for self-education. I highly recommend dedicating some time to thoroughly examine them.   Please continue to a heart-healthy diet and increase your physical activities. Try to exercise for at least five days a week.    It was a pleasure to see you and I look forward to continuing to work together on your health and well-being. Please do not hesitate to call the office if you need care or have questions about your care.  In case of emergency, please visit the Emergency Department for urgent care, or contact our clinic at [phone] to schedule an appointment. We're here to help you!   Have a wonderful day and week. With Gratitude, Gilmore Laroche MSN, FNP-BC

## 2023-06-08 NOTE — Progress Notes (Signed)
Established Patient Office Visit  Subjective:  Patient ID: Kathleen Erickson, female    DOB: May 02, 1961  Age: 62 y.o. MRN: [id number]  CC:  Chief Complaint  Patient presents with   Follow-up    2 week f/u    Hip Pain    Pt reports hip pain on left hip radiating down her leg    HPI Kathleen Erickson is a 62 y.o. female presents complaints of left hip pain.  Left hip pain: The patient reports no recent trauma or injury to the left hip but complains of low back pain radiating down her left hip and lower extremities. The pain is described as an achy sensation that worsens with inactivity and improves with movement. She also reports occasional numbness and tingling in her lower extremities. The pain is rated 7-8 out of 10. Range of motion in the lower extremities is intact.  Past Medical History:  Diagnosis Date   Allergy    Anemia    thalcemia   Anxiety    Arthritis    Cancer (HCC)    Cervical cancer   COPD (chronic obstructive pulmonary disease) (HCC)    Depression    Dyspnea    Family history of breast cancer    Family history of lung cancer    Family history of ovarian cancer    Family history of skin cancer    GERD (gastroesophageal reflux disease)    Hyperlipidemia    Hypothyroidism    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HCC)    patient report    Past Surgical History:  Procedure Laterality Date   BILATERAL CARPAL TUNNEL RELEASE Bilateral    at least 10 years ago   BIOPSY  08/22/2021   Procedure: BIOPSY;  Surgeon: Corbin Ade, MD;  Location: [facility];  Service: Endoscopy;;   carpel tunnel release     bilaterally   CESAREAN SECTION     COLONOSCOPY N/A 06/22/2014   Dr. Rourk:External and internal hemorrhoids-grade 4; otherwise normal rectum total colon and terminal ileum.   COLONOSCOPY WITH PROPOFOL N/A 11/26/2017   external and internal hemorrhoids, prominent anal papilla, normal rectum and colon, normal TI.    COLONOSCOPY WITH PROPOFOL N/A 08/22/2021   nonbleeding external  and internal hemorrhoids (severe large, and grade 4), 6 mm nonbleeding AVM in the base of the cecum (intervention performed APC, and clip placed), rest of the exam normal.   COLPOSCOPY     ESOPHAGOGASTRODUODENOSCOPY N/A 06/22/2014   Dr. Jena Gauss:The mucosa of the esophagus appeared normal  Single gastric ulcer ranging between 3-5 mm in size was found   Biopsy with negative H.pylori.    ESOPHAGOGASTRODUODENOSCOPY N/A 10/28/2014   Dr. Jena Gauss: patulous EG junction, small hiatal hernia, previous noted gastric ulcer healed   ESOPHAGOGASTRODUODENOSCOPY (EGD) WITH PROPOFOL N/A 11/26/2017   normal esophagus, normal stomach, normal duodenum.    ESOPHAGOGASTRODUODENOSCOPY (EGD) WITH PROPOFOL N/A 08/22/2021   normal esophagus, normal stomach (minimal polypoid mucosa), normal duodenal bulb, subtle finding somewhat suspicious for hemobilia or pancreaticus. Pathology revealed reactive gastropathy and negative for H. pylori.   GIVENS CAPSULE STUDY N/A 02/14/2021   Procedure: GIVENS CAPSULE STUDY;  Surgeon: Corbin Ade, MD;  Location: [facility];  Service: Endoscopy;  Laterality: N/A;  7:30am   HEMOSTASIS CLIP PLACEMENT  08/22/2021   Procedure: HEMOSTASIS CLIP PLACEMENT;  Surgeon: Corbin Ade, MD;  Location: [facility];  Service: Endoscopy;;   HOT HEMOSTASIS  08/22/2021   Procedure: HOT HEMOSTASIS (ARGON PLASMA COAGULATION/BICAP);  Surgeon:  Rourk, Gerrit Friends, MD;  Location: [facility];  Service: Endoscopy;;   VAGINAL HYSTERECTOMY     bleeding; she thinks it was a partial and has ovaries in place    Family History  Problem Relation Age of Onset   Uterine cancer Mother    Heart disease Mother    Alcohol abuse Mother    Arthritis Mother    COPD Mother    Depression Mother    Ovarian cancer Mother        dx 51s   Heart disease Father        age 60, family questions autopsy   Alcohol abuse Father    Arthritis Father    Depression Father    Early death Father    Cancer Other        ulcer,  aunt   Lung cancer Cousin        dx 56s, then again in her 31s (maternal first cousin)   Throat cancer Cousin    Asthma Son    COPD Sister    Lung cancer Maternal Aunt        dx 60s/70s, smoker, ulcer   Cancer Maternal Uncle        dx 95s, unknown primary (affected eye/head, colon, stomach), chewed tobacco   Gout Paternal Uncle    Seizures Paternal Uncle    Lung cancer Maternal Aunt        dx 79s, non smoker   Cancer Maternal Aunt        dx 6s, mouth cancer, chewed tobacco   Skin cancer Maternal Uncle    Skin cancer Maternal Uncle    Breast cancer Niece 67   Cancer Niece        unknown type, dx 30s   Lung cancer Cousin        dx late 42s (maternal first cousin)   Skin cancer Cousin        multiple (maternal first cousin)   Colon cancer Neg Hx    Ulcers Neg Hx     Social History   Socioeconomic History   Marital status: Legally Separated    Spouse name: Iantha Fallen   Number of children: 2   Years of education: 10   Highest education level: Not on file  Occupational History   Occupation: not working    Comment: cleaned houses    Comment: disabled from arthritis  Tobacco Use   Smoking status: Former    Current packs/day: 0.00    Average packs/day: 0.3 packs/day for 46.6 years (11.7 ttl pk-yrs)    Types: Cigarettes    Start date: 09/25/1973    Quit date: 05/17/2020    Years since quitting: 3.0   Smokeless tobacco: Never  Vaping Use   Vaping status: Never Used  Substance and Sexual Activity   Alcohol use: No   Drug use: No   Sexual activity: Yes    Birth control/protection: None  Other Topics Concern   Not on file  Social History Narrative   Lives alone , has family around her.    Social Determinants of Health   Financial Resource Strain: Medium Risk (03/12/2023)   Overall Financial Resource Strain (CARDIA)    Difficulty of Paying Living Expenses: Somewhat hard  Food Insecurity: Food Insecurity Present (03/12/2023)   Hunger Vital Sign    Worried About Running  Out of Food in the Last Year: Sometimes true    Ran Out of Food in the Last Year: Sometimes true  Transportation Needs:  No Transportation Needs (03/12/2023)   PRAPARE - Administrator, Civil Service (Medical): No    Lack of Transportation (Non-Medical): No  Physical Activity: Sufficiently Active (03/12/2023)   Exercise Vital Sign    Days of Exercise per Week: 7 days    Minutes of Exercise per Session: 100 min  Stress: No Stress Concern Present (03/12/2023)   [employer] of Occupational Health - Occupational Stress Questionnaire    Feeling of Stress : Only a little  Social Connections: Moderately Isolated (03/12/2023)   Social Connection and Isolation Panel [NHANES]    Frequency of Communication with Friends and Family: More than three times a week    Frequency of Social Gatherings with Friends and Family: Twice a week    Attends Religious Services: More than 4 times per year    Active Member of [employer] or Organizations: No    Attends Banker Meetings: Never    Marital Status: Separated  Intimate Partner Violence: Not At Risk (03/12/2023)   Humiliation, Afraid, Rape, and Kick questionnaire    Fear of Current or Ex-Partner: No    Emotionally Abused: No    Physically Abused: No    Sexually Abused: No    Outpatient Medications Prior to Visit  Medication Sig Dispense Refill   albuterol (VENTOLIN HFA) 108 (90 Base) MCG/ACT inhaler TAKE 2 PUFFS BY MOUTH EVERY 6 HOURS AS NEEDED FOR WHEEZE OR SHORTNESS OF BREATH 18 each 3   atorvastatin (LIPITOR) 20 MG tablet Take 0.5 tablets (10 mg total) by mouth daily. 90 tablet 2   bisacodyl (GENTLE LAXATIVE) 5 MG EC tablet Take 5 mg by mouth daily as needed for moderate constipation.     Cholecalciferol (VITAMIN D) 50 MCG (2000 UT) CAPS Take by mouth.     clobetasol ointment (TEMOVATE) 0.05 % APPLY TO AFFECTED AREA TWICE A DAY 30 g 0   desvenlafaxine (PRISTIQ) 50 MG 24 hr tablet TAKE 1 TABLET BY MOUTH EVERY DAY 90 tablet 1    hydrocortisone (ANUSOL-HC) 2.5 % rectal cream PLACE 1 APPLICATION RECTALLY 2 (TWO) TIMES DAILY. 30 g 1   levothyroxine (SYNTHROID) 50 MCG tablet Take 1 tablet (50 mcg total) by mouth daily. 90 tablet 3   levothyroxine (SYNTHROID) 75 MCG tablet TAKE 1 TABLET BY MOUTH EVERY DAY 90 tablet 0   LINZESS 290 MCG CAPS capsule TAKE 1 CAPSULE BY MOUTH EVERY DAY 30 capsule 5   meclizine (ANTIVERT) 25 MG tablet Take 1 tablet (25 mg total) by mouth 3 (three) times daily as needed for dizziness. 90 tablet 2   mometasone (ELOCON) 0.1 % cream APPLY DAILY AS DIRECTED-FOR AS NEEDED FOR DRY EAR 45 g 1   nitrofurantoin, macrocrystal-monohydrate, (MACROBID) 100 MG capsule TAKE 1 TABLET TWICE DAILY X 7 DAYS AND THEN 1 TABLET AT BEDTIME UNTIL GONE 35 capsule 11   Omega-3 Fatty Acids (FISH OIL) 1000 MG CAPS Take 2,000 mg by mouth daily.     prednisoLONE acetate (PRED FORTE) 1 % ophthalmic suspension Place 4 drops into both eyes 2 (two) times daily as needed. Administer 4 drops into each ear twice daily as needed for itching. 5 mL 2   Turmeric (QC TUMERIC COMPLEX PO) Take 3,000 mg by mouth once. Once daily     UNABLE TO FIND Take 1 Piece of gum by mouth daily. Med Name: SKIN & GUT essential probiotic     UNABLE TO FIND Med Name: GOLO     vitamin B-12 (CYANOCOBALAMIN) 250 MCG tablet Take  250 mcg by mouth daily.     No facility-administered medications prior to visit.    Allergies  Allergen Reactions   Venofer [Iron Sucrose] Other (See Comments)    Patient had a hypersensitivity reaction to Venofer.  See progress note from 04/25/21.   Feraheme [Ferumoxytol] Swelling and Rash   Penicillins Hives    Has patient had a PCN reaction causing immediate rash, facial/tongue/throat swelling, SOB or lightheadedness with hypotension: No Has patient had a PCN reaction causing severe rash involving mucus membranes or skin necrosis: Yes Has patient had a PCN reaction that required hospitalization: No Has patient had a PCN reaction  occurring within the last 10 years: No If all of the above answers are "NO", then may proceed with Cephalosporin use.    Morphine And Codeine Itching          ROS Review of Systems  Constitutional:  Negative for chills and fever.  Eyes:  Negative for visual disturbance.  Respiratory:  Negative for chest tightness and shortness of breath.   Musculoskeletal:  Positive for arthralgias and back pain.  Neurological:  Negative for dizziness and headaches.      Objective:    Physical Exam HENT:     Head: Normocephalic.     Mouth/Throat:     Mouth: Mucous membranes are moist.  Cardiovascular:     Rate and Rhythm: Normal rate.     Heart sounds: Normal heart sounds.  Pulmonary:     Effort: Pulmonary effort is normal.     Breath sounds: Normal breath sounds.  Musculoskeletal:     Lumbar back: Positive left straight leg raise test. Negative right straight leg raise test.     Left hip: Tenderness present. No deformity or lacerations. Normal range of motion.  Neurological:     Mental Status: She is alert.     BP 116/75   Pulse 73   Ht 5\' 1"  (1.549 m)   Wt 135 lb (61.2 kg)   SpO2 95%   BMI 25.51 kg/m  Wt Readings from Last 3 Encounters:  06/08/23 135 lb (61.2 kg)  04/13/23 132 lb (59.9 kg)  03/12/23 136 lb 12.8 oz (62.1 kg)    Lab Results  Component Value Date   TSH 0.085 (L) 04/06/2023   Lab Results  Component Value Date   WBC 4.0 02/26/2023   HGB 11.1 02/26/2023   HCT 37.2 02/26/2023   MCV 64 (L) 02/26/2023   PLT 264 02/26/2023   Lab Results  Component Value Date   NA 143 02/26/2023   K 4.4 02/26/2023   CO2 23 02/26/2023   GLUCOSE 79 02/26/2023   BUN 13 02/26/2023   CREATININE 0.61 02/26/2023   BILITOT 0.3 02/26/2023   ALKPHOS 84 02/26/2023   AST 18 02/26/2023   ALT 13 02/26/2023   PROT 6.8 02/26/2023   ALBUMIN 4.7 02/26/2023   CALCIUM 9.3 02/26/2023   ANIONGAP 9 02/11/2020   EGFR 100 02/26/2023   Lab Results  Component Value Date   CHOL 181  02/26/2023   Lab Results  Component Value Date   HDL 73 02/26/2023   Lab Results  Component Value Date   LDLCALC 97 02/26/2023   Lab Results  Component Value Date   TRIG 58 02/26/2023   Lab Results  Component Value Date   CHOLHDL 2.5 02/26/2023   Lab Results  Component Value Date   HGBA1C 5.7 (H) 02/26/2023      Assessment & Plan:  Left hip pain Assessment &  Plan: An X-ray will be ordered to rule out left hip pathology.  Treatment Plan:Naproxen: Start taking naproxen 500 mg twice daily for 7 days. Flexeril: Additionally, take Flexeril 5 mg twice daily as needed for muscle pain and spasms. It is recommended to take Flexeril at night, as it may cause drowsiness. Avoid taking it during activities requiring mental alertness. Heat and Cold Therapy:Apply cold packs to reduce inflammation and numb the affected area. Use heat therapy, such as a warm compress or heating pad, to relax muscles and improve blood flow. Activity: Avoid prolonged inactivity, as it can lead to stiffness and muscle weakness. Gradual movement and activity are encouraged to promote healing. Exercise: Engage in targeted exercises focusing on stretching and strengthening the muscles that support the lower back and core. Follow up with orthopedic surgery if your symptoms do not improve with the current treatment regimen.  Orders: -     Naproxen; Take 1 tablet (500 mg total) by mouth 2 (two) times daily with a meal for 7 days.  Dispense: 14 tablet; Refill: 0 -     Cyclobenzaprine HCl; Take 1 tablet (5 mg total) by mouth 3 (three) times daily as needed for muscle spasms.  Dispense: 30 tablet; Refill: 1 -     DG HIP UNILAT W OR W/O PELVIS 2-3 VIEWS LEFT  Immunization due -     Varicella-zoster vaccine IM  IFG (impaired fasting glucose) -     Hemoglobin A1c  Vitamin D deficiency -     VITAMIN D 25 Hydroxy (Vit-D Deficiency, Fractures)  Other specified hypothyroidism -     TSH + free T4  Other  hyperlipidemia -     Lipid panel -     CMP14+EGFR -     CBC with Differential/Platelet   Note: This chart has been completed using Engineer, civil (consulting) software, and while attempts have been made to ensure accuracy, certain words and phrases may not be transcribed as intended.   Follow-up: Return in about 4 months (around 10/08/2023).   Gilmore Laroche, FNP

## 2023-06-09 LAB — CBC WITH DIFFERENTIAL/PLATELET
Basophils Absolute: 0.1 10*3/uL (ref 0.0–0.2)
Basos: 1 %
EOS (ABSOLUTE): 0.2 10*3/uL (ref 0.0–0.4)
Eos: 5 %
Hematocrit: 39.2 % (ref 34.0–46.6)
Hemoglobin: 11.6 g/dL (ref 11.1–15.9)
Immature Grans (Abs): 0 10*3/uL (ref 0.0–0.1)
Immature Granulocytes: 0 %
Lymphocytes Absolute: 1.2 10*3/uL (ref 0.7–3.1)
Lymphs: 26 %
MCH: 18.7 pg — ABNORMAL LOW (ref 26.6–33.0)
MCHC: 29.6 g/dL — ABNORMAL LOW (ref 31.5–35.7)
MCV: 63 fL — ABNORMAL LOW (ref 79–97)
Monocytes Absolute: 0.6 10*3/uL (ref 0.1–0.9)
Monocytes: 12 %
Neutrophils Absolute: 2.6 10*3/uL (ref 1.4–7.0)
Neutrophils: 56 %
Platelets: 296 10*3/uL (ref 150–450)
RBC: 6.19 x10E6/uL — ABNORMAL HIGH (ref 3.77–5.28)
RDW: 17.5 % — ABNORMAL HIGH (ref 11.7–15.4)
WBC: 4.7 10*3/uL (ref 3.4–10.8)

## 2023-06-09 LAB — LIPID PANEL
Chol/HDL Ratio: 3 ratio (ref 0.0–4.4)
Cholesterol, Total: 226 mg/dL — ABNORMAL HIGH (ref 100–199)
HDL: 76 mg/dL (ref >39)
LDL Chol Calc (NIH): 135 mg/dL — ABNORMAL HIGH (ref 0–99)
Triglycerides: 87 mg/dL (ref 0–149)
VLDL Cholesterol Cal: 15 mg/dL (ref 5–40)

## 2023-06-09 LAB — CMP14+EGFR
ALT: 19 IU/L (ref 0–32)
AST: 23 IU/L (ref 0–40)
Albumin: 5 g/dL — ABNORMAL HIGH (ref 3.9–4.9)
Alkaline Phosphatase: 104 IU/L (ref 44–121)
BUN/Creatinine Ratio: 22 (ref 12–28)
BUN: 16 mg/dL (ref 8–27)
Bilirubin Total: 0.4 mg/dL (ref 0.0–1.2)
CO2: 25 mmol/L (ref 20–29)
Calcium: 9.5 mg/dL (ref 8.7–10.3)
Chloride: 100 mmol/L (ref 96–106)
Creatinine, Ser: 0.73 mg/dL (ref 0.57–1.00)
Globulin, Total: 2.4 g/dL (ref 1.5–4.5)
Glucose: 93 mg/dL (ref 70–99)
Potassium: 4.8 mmol/L (ref 3.5–5.2)
Sodium: 139 mmol/L (ref 134–144)
Total Protein: 7.4 g/dL (ref 6.0–8.5)
eGFR: 92 mL/min/{1.73_m2} (ref >59)

## 2023-06-09 LAB — HEMOGLOBIN A1C
Est. average glucose Bld gHb Est-mCnc: 117 mg/dL
Hgb A1c MFr Bld: 5.7 % — ABNORMAL HIGH (ref 4.8–5.6)

## 2023-06-09 LAB — TSH+FREE T4
Free T4: 0.96 ng/dL (ref 0.82–1.77)
TSH: 5.77 u[IU]/mL — ABNORMAL HIGH (ref 0.450–4.500)

## 2023-06-09 LAB — VITAMIN D 25 HYDROXY (VIT D DEFICIENCY, FRACTURES): Vit D, 25-Hydroxy: 47.9 ng/mL (ref 30.0–100.0)

## 2023-06-11 ENCOUNTER — Other Ambulatory Visit: Payer: Self-pay | Admitting: Family Medicine

## 2023-06-11 DIAGNOSIS — E038 Other specified hypothyroidism: Secondary | ICD-10-CM

## 2023-06-11 DIAGNOSIS — D649 Anemia, unspecified: Secondary | ICD-10-CM

## 2023-06-11 NOTE — Progress Notes (Signed)
The 10-year ASCVD risk score (Arnett DK, et al., 2019) is: 3.4%   Values used to calculate the score:     Age: 62 years     Sex: Female     Is Non-Hispanic African American: No     Diabetic: No     Tobacco smoker: No     Systolic Blood Pressure: 116 mmHg     Is BP treated: No     HDL Cholesterol: 76 mg/dL     Total Cholesterol: 226 mg/dL

## 2023-06-11 NOTE — Progress Notes (Signed)
Please inform the patient that her lab results indicate she is prediabetic. I recommend reducing her intake of high-sugar foods and beverages.  Her LDL cholesterol is elevated, and I suggest considering atorvastatin 20 mg daily. Additionally, decreasing her consumption of greasy and fatty foods, combined with increased physical activity, will be beneficial.  Advise the patient to return for laboratory tests in 6 weeks to reassess her thyroid levels, as her TSH is slightly elevated. Orders have been placed to check for iron deficiency anemia, and I recommend increasing her intake of iron-rich foods such as lean meats, leafy greens, and fortified cereals.

## 2023-06-18 DIAGNOSIS — L089 Local infection of the skin and subcutaneous tissue, unspecified: Secondary | ICD-10-CM | POA: Diagnosis not present

## 2023-06-18 DIAGNOSIS — S80861A Insect bite (nonvenomous), right lower leg, initial encounter: Secondary | ICD-10-CM | POA: Diagnosis not present

## 2023-06-26 NOTE — Progress Notes (Signed)
  Please inform the patient that her X-ray results indicate that her left hip is intact, with no displaced fractures or malalignment. However, the X-ray also shows that her bone density is weaker than normal, indicating an increased risk for fractures.  I recommend increasing her intake of calcium and vitamin D, which are essential for bone health. Specifically, I suggest a daily intake of 1200 mg of calcium and 600 IU of vitamin D.

## 2023-07-16 ENCOUNTER — Other Ambulatory Visit: Payer: Self-pay | Admitting: Family Medicine

## 2023-07-16 DIAGNOSIS — J302 Other seasonal allergic rhinitis: Secondary | ICD-10-CM

## 2023-07-16 DIAGNOSIS — L920 Granuloma annulare: Secondary | ICD-10-CM

## 2023-07-16 DIAGNOSIS — E038 Other specified hypothyroidism: Secondary | ICD-10-CM

## 2023-07-19 DIAGNOSIS — D649 Anemia, unspecified: Secondary | ICD-10-CM | POA: Diagnosis not present

## 2023-07-19 DIAGNOSIS — E038 Other specified hypothyroidism: Secondary | ICD-10-CM | POA: Diagnosis not present

## 2023-07-21 LAB — IRON,TIBC AND FERRITIN PANEL
Ferritin: 77 ng/mL (ref 15–150)
Iron Saturation: 27 % (ref 15–55)
Iron: 75 ug/dL (ref 27–139)
Total Iron Binding Capacity: 276 ug/dL (ref 250–450)
UIBC: 201 ug/dL (ref 118–369)

## 2023-07-21 LAB — HGB FRACTIONATION CASCADE
Hgb A2: 4.7 % — ABNORMAL HIGH (ref 1.8–3.2)
Hgb A: 95.3 % — ABNORMAL LOW (ref 96.4–98.8)
Hgb F: 0 % (ref 0.0–2.0)
Hgb S: 0 %

## 2023-07-21 LAB — TSH+FREE T4
Free T4: 0.96 ng/dL (ref 0.82–1.77)
TSH: 4.4 u[IU]/mL (ref 0.450–4.500)

## 2023-08-06 ENCOUNTER — Other Ambulatory Visit: Payer: Self-pay | Admitting: Family Medicine

## 2023-08-06 DIAGNOSIS — L920 Granuloma annulare: Secondary | ICD-10-CM

## 2023-09-07 ENCOUNTER — Telehealth: Payer: Self-pay | Admitting: Family Medicine

## 2023-09-07 NOTE — Telephone Encounter (Unsigned)
Copied from CRM [id number]. Topic: General - Other >> Sep 06, 2023  2:30 PM Geneva B wrote: Reason for CRM: pt is calling because she has a therapy appt but she says that she do not need the appointment for what it is saying she said she needs therapy for her knee and that she was never told about having therapy she would like to get clarification

## 2023-09-10 ENCOUNTER — Other Ambulatory Visit: Payer: Self-pay | Admitting: Family Medicine

## 2023-09-14 ENCOUNTER — Ambulatory Visit (HOSPITAL_COMMUNITY): Payer: Medicaid Other

## 2023-09-24 ENCOUNTER — Ambulatory Visit (HOSPITAL_COMMUNITY): Payer: Medicaid Other

## 2023-10-11 ENCOUNTER — Ambulatory Visit: Payer: Medicaid Other | Admitting: Internal Medicine

## 2023-10-11 ENCOUNTER — Encounter: Payer: Self-pay | Admitting: Internal Medicine

## 2023-10-11 ENCOUNTER — Ambulatory Visit: Payer: Self-pay | Admitting: Family Medicine

## 2023-10-11 VITALS — BP 112/61 | HR 71 | Ht 61.0 in | Wt 137.8 lb

## 2023-10-11 DIAGNOSIS — L03211 Cellulitis of face: Secondary | ICD-10-CM | POA: Insufficient documentation

## 2023-10-11 MED ORDER — CEPHALEXIN 500 MG PO CAPS
500.0000 mg | ORAL_CAPSULE | Freq: Four times a day (QID) | ORAL | 0 refills | Status: AC
Start: 2023-10-11 — End: 2023-10-16

## 2023-10-11 MED ORDER — DOXYCYCLINE HYCLATE 100 MG PO TABS
100.0000 mg | ORAL_TABLET | Freq: Two times a day (BID) | ORAL | 0 refills | Status: AC
Start: 2023-10-11 — End: 2023-10-16

## 2023-10-11 NOTE — Patient Instructions (Signed)
It was a pleasure to see you today.  Thank you for giving [place] the opportunity to be involved in your care.  Below is a brief recap of your visit and next steps.  We will plan to see you again Monday 10/15/23.  Summary Keflex / Doxycycline prescribed for cellulitis OK to take tylenol as needed for pain relief Follow up with Malachi Bonds as scheduled on Monday 10/15/23

## 2023-10-11 NOTE — Telephone Encounter (Signed)
Copied from CRM [id number]. Topic: Clinical - Red Word Triage >> Oct 11, 2023  8:05 AM Ivette P wrote: [facility] that prompted transfer to Nurse Triage: Rash on Chin, Diarrhea, right side of shoulder, body aches.   Chief Complaint: neck/shoulder pain Symptoms: shoulder pain and "pull in front," neck pain, pain/itching/pus to spot on chin, diarrhea, body aches Frequency: continual, intermittent Pertinent Negatives: Patient denies SOB, chest pain, blood in diarrhea, swelling to extremities/neck/shoulder Disposition: [] 911 / [] ED /[] Urgent Care (no appt availability in office) / [] Appointment(In office/virtual)/ []  [facility]/ [] Home Care/ [x] Refused Recommended Disposition /[]  Mobile Bus/ []  Follow-up with PCP Additional Notes: Pt reporting 6/10 pain in shoulder and neck started at 1 am this morning, "couldn't sleep none last night been up and down." Pt reporting "pain going down back of right shoulder in back when take deep breath, left side of neck when moving feels like something hard or sore from chin area to below collar bone."  Pt reporting "neck hurts when move it, shoulder hurts when breathe in, whole body hurts like been beat but not been beat, especially when walk like hips are just rubbing." Pt reporting "really bad spot on chin like something bit me really bad looking, face itching, been there almost a week, just where the bad spot is, getting bigger and bigger, seeps yellow fluid from it, running all the time," confirms redness and streaking from chin area. Pt confirms no known injury or overuse, no cardiac hx, confirms still able to get chin to chest, no weakness or swelling to extremities or neck/shoulder. Pt reporting "chest is tight but not pain but not like heart attack or nothing," also "feel pull in front too little bit, left front side where hardness is going up to neck." Pt also reporting "stomach was real sore too" with "diarrhea twice this morning." Advised pt be  examined in ED. Pt refusing "would so much rather come to my doctor." Pt reporting she thinks may have been bitten by something "may be poisoning my whole body." Nurse advised ED. Pt refusing. Nurse told pt nurse would send HP message to PCP office for them to call her back with recommendations or scheduling shortly. Pt verbalized understanding.   Reason for Disposition  [1] Age > 40 AND [2] no obvious cause AND [3] pain even when not moving the arm  (Exception: Pain is clearly made worse by moving arm or bending neck.)  Answer Assessment - Initial Assessment Questions 1. ONSET: "When did the pain start?"     1 am this morning 2. LOCATION: "Where is the pain located?"     pain going down back of right shoulder in back when take deep breath, left side of neck when moving feels like something hard or sore from chin area to below collar bone. Feel pull in front too little bit, left front side where hardness is going up to neck. Whole body hurts like been beat but not been beat, especially when walk like hips are just rubbing 3. PAIN: "How bad is the pain?" (Scale 1-10; or mild, moderate, severe)   - MILD (1-3): doesn't interfere with normal activities   - MODERATE (4-7): interferes with normal activities (e.g., work or school) or awakens from sleep   - SEVERE (8-10): excruciating pain, unable to do any normal activities, unable to move arm at all due to pain     6/10 4. WORK OR EXERCISE: "Has there been any recent work or exercise that involved this  part of the body?"     denies 5. CAUSE: "What do you think is causing the shoulder pain?"     Thinks may have been bitten by something "may be poisoning my whole body" 6. OTHER SYMPTOMS: "Do you have any other symptoms?" (e.g., neck pain, swelling, rash, fever, numbness, weakness)     Diarrhea x2 this morning,  really bad spot on chin like something bit me really bad looking, face itching, been there almost a week, just where the bad spot is, getting  bigger and bigger, seeps yellow fluid from it running all the time, confirms redness and streaking to chin area  Protocols used: Shoulder Pain-A-AH

## 2023-10-11 NOTE — Progress Notes (Signed)
Acute Office Visit  Subjective:     Patient ID: Kathleen Erickson, female    DOB: 01-19-1961, 63 y.o.   MRN: [id number]  Chief Complaint  Patient presents with   Rash    Patient got bit last Friday and a rash has formed on her chin   Generalized Body Aches    Patient states her body hurts all over , especially in right shoulder when taking a deep breath   [name] presents today for evaluation of a facial rash and generalized bodyaches.  She reports onset of an itching rash within the last few days.  There was initially a lesion on the right side of her chin that looked like a bug bite first noticed late last week.  Since that time the lesion has increased in size and has started to expel yellow material.  It itches and causes discomfort.  Today she noticed pain in the left side of her neck and across the top of her right shoulder.  She endorses chills but has not checked her temperature.  Review of Systems  Musculoskeletal:  Positive for joint pain and neck pain.  Skin:  Positive for rash (Facial rash, right side of chin).      Objective:    BP 112/61 (BP Location: Left Arm, Patient Position: Sitting, Cuff Size: Normal)   Pulse 71   Ht 5\' 1"  (1.549 m)   Wt 137 lb 12.8 oz (62.5 kg)   SpO2 95%   BMI 26.04 kg/m   Physical Exam Vitals reviewed.  Constitutional:      Appearance: She is not toxic-appearing.  Musculoskeletal:        General: No tenderness. Normal range of motion.  Skin:    Findings: Rash (Erythematous, cellulitic rash on right side of chin with crusted yellow material) present.  Neurological:     Mental Status: She is alert.       Assessment & Plan:   Problem List Items Addressed This Visit       Cellulitis of face - Primary   Presenting today for an acute visit for evaluation of a rash that has developed on the right side of her chin.  She endorses onset of joint pain and myalgias earlier today.  The rash appears most consistent with cellulitis.  Vital  signs are reassuring, low suspicion for sepsis. -Empirically treat for cellulitis with Keflex/doxycycline x 5 days -Okay to use Tylenol as needed for pain relief -She will return to care for previously scheduled follow-up with her PCP on Monday (1/20) -Of note, she has a documented penicillin allergy that she states occurred in childhood.  She is unaware of the reaction.  No event within the last 10 years.  No history of hospital admission because of an adverse reaction to penicillin.  No history of angioedema associated with penicillin.  With this in mind, Keflex was prescribed today.  She was instructed to present to the emergency department if she develops any adverse symptoms after taking Keflex.       Meds ordered this encounter  Medications   cephALEXin (KEFLEX) 500 MG capsule    Sig: Take 1 capsule (500 mg total) by mouth 4 (four) times daily for 5 days.    Dispense:  20 capsule    Refill:  0   doxycycline (VIBRA-TABS) 100 MG tablet    Sig: Take 1 tablet (100 mg total) by mouth 2 (two) times daily for 5 days.    Dispense:  10 tablet  Refill:  0    Return in about 4 days (around 10/15/2023).  Billie Lade, MD

## 2023-10-11 NOTE — Assessment & Plan Note (Signed)
Presenting today for an acute visit for evaluation of a rash that has developed on the right side of her chin.  She endorses onset of joint pain and myalgias earlier today.  The rash appears most consistent with cellulitis.  Vital signs are reassuring, low suspicion for sepsis. -Empirically treat for cellulitis with Keflex/doxycycline x 5 days -Okay to use Tylenol as needed for pain relief -She will return to care for previously scheduled follow-up with her PCP on Monday (1/20) -Of note, she has a documented penicillin allergy that she states occurred in childhood.  She is unaware of the reaction.  No event within the last 10 years.  No history of hospital admission because of an adverse reaction to penicillin.  No history of angioedema associated with penicillin.  With this in mind, Keflex was prescribed today.  She was instructed to present to the emergency department if she develops any adverse symptoms after taking Keflex.

## 2023-10-15 ENCOUNTER — Encounter: Payer: Self-pay | Admitting: Family Medicine

## 2023-10-15 ENCOUNTER — Ambulatory Visit: Payer: Medicaid Other | Admitting: Family Medicine

## 2023-10-15 VITALS — BP 112/66 | HR 68 | Ht 61.0 in | Wt 135.1 lb

## 2023-10-15 DIAGNOSIS — E038 Other specified hypothyroidism: Secondary | ICD-10-CM | POA: Diagnosis not present

## 2023-10-15 DIAGNOSIS — E785 Hyperlipidemia, unspecified: Secondary | ICD-10-CM | POA: Diagnosis not present

## 2023-10-15 DIAGNOSIS — E559 Vitamin D deficiency, unspecified: Secondary | ICD-10-CM

## 2023-10-15 DIAGNOSIS — R7301 Impaired fasting glucose: Secondary | ICD-10-CM | POA: Diagnosis not present

## 2023-10-15 DIAGNOSIS — Z23 Encounter for immunization: Secondary | ICD-10-CM

## 2023-10-15 DIAGNOSIS — R7303 Prediabetes: Secondary | ICD-10-CM | POA: Diagnosis not present

## 2023-10-15 NOTE — Assessment & Plan Note (Signed)
The patient reports taking Synthroid 50 MCG daily on empty stomach The patient is encouraged to follow up if experiencing symptoms of hypothyroidism, despite compliance with the current treatment regimen, such as fatigue, weight gain, constipation, dry skin, cold intolerance, hair loss, or depression.  Lab Results  Component Value Date   TSH 4.400 07/19/2023

## 2023-10-15 NOTE — Patient Instructions (Signed)
I appreciate the opportunity to provide care to you today!    Follow up:  4 months  Labs: please stop by the lab today to get your blood drawn (CBC, CMP, TSH, Lipid profile, HgA1c, Vit D)   Here are some foods to avoid or reduce in your diet to help manage cholesterol levels:  Fried Foods:Deep-fried items such as french fries, fried chicken, and fried snacks are high in unhealthy fats and can raise LDL (bad) cholesterol levels. Processed Meats:Foods like bacon, sausage, hot dogs, and deli meats are often high in saturated fat and cholesterol. Full-Fat Dairy Products:Whole milk, full-fat yogurt, butter, cream, and cheese are rich in saturated fats, which can increase cholesterol levels. Baked Goods and Sweets:Pastries, cakes, cookies, and donuts often contain trans fats and added sugars, which can raise LDL cholesterol and lower HDL (good) cholesterol. Red Meat:Beef, lamb, and pork are high in saturated fat. Lean cuts or plant-based protein alternatives are better options. Lard and Shortening:Used in some baked goods, lard and shortening are high in trans fats and should be avoided. Fast Food:Many fast food items are cooked with unhealthy oils and contain high amounts of saturated and trans fats. Processed Snacks:Chips, crackers, and certain microwave popcorns can contain trans fats and high levels of unhealthy oils. Shellfish:While nutritious in other ways, some shellfish like shrimp, lobster, and crab are high in cholesterol. They should be consumed in moderation. Coconut and Palm Oils:these oils are high in saturated fat and can raise cholesterol levels when used in cooking or baking.    For managing prediabetes, I recommend the following lifestyle changes:  Reduce Intake of High-Sugar Foods and Beverages: Limit foods and drinks high in sugar to help regulate blood sugar levels. Increase Consumption of Nutrient-Rich Foods: Focus on incorporating more fruits, vegetables, and whole grains  into your diet. Choose Lean Proteins: Opt for lean proteins such as chicken, fish, beans, and legumes. Select Low-Fat Dairy Products: Choose low-fat or non-fat dairy options. Minimize Saturated Fats, Trans Fats, and Cholesterol: Reduce intake of foods high in saturated fats, trans fatty acids, and cholesterol. Engage in Regular Physical Activity: Aim for at least 30 minutes of brisk walking or other moderate activity at least 5 days a week.  Lifestyle modifications for prediabetes were discussed, including adopting a heart-healthy diet and increasing physical activity. The patient was encouraged to decrease her intake of high-sugar foods and beverages. She verbalized understanding and is aware of the plan of care.      Please continue to a heart-healthy diet and increase your physical activities. Try to exercise for at least five days a week.    It was a pleasure to see you and I look forward to continuing to work together on your health and well-being. Please do not hesitate to call the office if you need care or have questions about your care.  In case of emergency, please visit the Emergency Department for urgent care, or contact our clinic at [phone] to schedule an appointment. We're here to help you!   Have a wonderful day and week. With Gratitude, Gilmore Laroche MSN, FNP-BC

## 2023-10-15 NOTE — Assessment & Plan Note (Signed)
For managing prediabetes, I recommend the following lifestyle changes:  Reduce Intake of High-Sugar Foods and Beverages: Limit foods and drinks high in sugar to help regulate blood sugar levels. Increase Consumption of Nutrient-Rich Foods: Focus on incorporating more fruits, vegetables, and whole grains into your diet. Choose Lean Proteins: Opt for lean proteins such as chicken, fish, beans, and legumes. Select Low-Fat Dairy Products: Choose low-fat or non-fat dairy options. Minimize Saturated Fats, Trans Fats, and Cholesterol: Reduce intake of foods high in saturated fats, trans fatty acids, and cholesterol. Engage in Regular Physical Activity: Aim for at least 30 minutes of brisk walking or other moderate activity at least 5 days a week.  Lifestyle modifications for prediabetes were discussed, including adopting a heart-healthy diet and increasing physical activity. The patient was encouraged to decrease her intake of high-sugar foods and beverages. She verbalized understanding and is aware of the plan of care.

## 2023-10-15 NOTE — Progress Notes (Signed)
Established Patient Office Visit  Subjective:  Patient ID: Kathleen Erickson, female    DOB: July 22, 1961  Age: 63 y.o. MRN: [id number]  CC:  Chief Complaint  Patient presents with   Follow-up    4 month chronic follow up     HPI Kathleen Erickson is a 63 y.o. female with past medical history of prediabetes, GERD, hypothyroidism, hyperlipidemia presents for f/u of  chronic medical conditions. For the details of today's visit, please refer to the assessment and plan.     Past Medical History:  Diagnosis Date   Allergy    Anemia    thalcemia   Anxiety    Arthritis    Cancer (HCC)    Cervical cancer   COPD (chronic obstructive pulmonary disease) (HCC)    Depression    Dyspnea    Family history of breast cancer    Family history of lung cancer    Family history of ovarian cancer    Family history of skin cancer    GERD (gastroesophageal reflux disease)    Hyperlipidemia    Hypothyroidism    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HCC)    patient report    Past Surgical History:  Procedure Laterality Date   BILATERAL CARPAL TUNNEL RELEASE Bilateral    at least 10 years ago   BIOPSY  08/22/2021   Procedure: BIOPSY;  Surgeon: Corbin Ade, MD;  Location: [facility];  Service: Endoscopy;;   carpel tunnel release     bilaterally   CESAREAN SECTION     COLONOSCOPY N/A 06/22/2014   Dr. Rourk:External and internal hemorrhoids-grade 4; otherwise normal rectum total colon and terminal ileum.   COLONOSCOPY WITH PROPOFOL N/A 11/26/2017   external and internal hemorrhoids, prominent anal papilla, normal rectum and colon, normal TI.    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   COLPOSCOPY     ESOPHAGOGASTRODUODENOSCOPY N/A 06/22/2014   Dr. Jena Gauss:The mucosa of the esophagus appeared normal  Single gastric  ulcer ranging between 3-5 mm in size was found   Biopsy with negative H.pylori.    ESOPHAGOGASTRODUODENOSCOPY N/A 10/28/2014   Dr. Jena Gauss: patulous EG junction, small hiatal hernia, previous noted gastric ulcer healed   ESOPHAGOGASTRODUODENOSCOPY (EGD) WITH PROPOFOL N/A 11/26/2017   normal esophagus, normal stomach, normal duodenum.    ESOPHAGOGASTRODUODENOSCOPY (EGD) WITH PROPOFOL N/A 08/22/2021   normal esophagus, normal stomach (minimal polypoid mucosa), normal duodenal bulb, subtle finding somewhat suspicious for hemobilia or pancreaticus. Pathology revealed reactive gastropathy and negative for H. pylori.   GIVENS CAPSULE STUDY N/A 02/14/2021   Procedure: GIVENS CAPSULE STUDY;  Surgeon: Corbin Ade, MD;  Location: [facility];  Service: Endoscopy;  Laterality: N/A;  7:30am   HEMOSTASIS CLIP PLACEMENT  08/22/2021   Procedure: HEMOSTASIS CLIP PLACEMENT;  Surgeon: Corbin Ade, MD;  Location: [facility];  Service: Endoscopy;;   HOT HEMOSTASIS  08/22/2021   Procedure: HOT HEMOSTASIS (ARGON PLASMA COAGULATION/BICAP);  Surgeon: Corbin Ade, MD;  Location: [facility];  Service: Endoscopy;;   VAGINAL HYSTERECTOMY     bleeding; she thinks it was a partial and has ovaries in place    Family History  Problem Relation Age of Onset   Uterine cancer Mother    Heart disease Mother    Alcohol abuse Mother  Arthritis Mother    COPD Mother    Depression Mother    Ovarian cancer Mother        dx 53s   Heart disease Father        age 31, family questions autopsy   Alcohol abuse Father    Arthritis Father    Depression Father    Early death Father    Cancer Other        ulcer, aunt   Lung cancer Cousin        dx 85s, then again in her 78s (maternal first cousin)   Throat cancer Cousin    Asthma Son    COPD Sister    Lung cancer Maternal Aunt        dx 60s/70s, smoker, ulcer   Cancer Maternal Uncle        dx 98s, unknown primary (affected eye/head, colon, stomach),  chewed tobacco   Gout Paternal Uncle    Seizures Paternal Uncle    Lung cancer Maternal Aunt        dx 19s, non smoker   Cancer Maternal Aunt        dx 61s, mouth cancer, chewed tobacco   Skin cancer Maternal Uncle    Skin cancer Maternal Uncle    Breast cancer Niece 61   Cancer Niece        unknown type, dx 30s   Lung cancer Cousin        dx late 64s (maternal first cousin)   Skin cancer Cousin        multiple (maternal first cousin)   Colon cancer Neg Hx    Ulcers Neg Hx     Social History   Socioeconomic History   Marital status: Legally Separated    Spouse name: Iantha Fallen   Number of children: 2   Years of education: 10   Highest education level: Not on file  Occupational History   Occupation: not working    Comment: cleaned houses    Comment: disabled from arthritis  Tobacco Use   Smoking status: Former    Current packs/day: 0.00    Average packs/day: 0.3 packs/day for 46.6 years (11.7 ttl pk-yrs)    Types: Cigarettes    Start date: 09/25/1973    Quit date: 05/17/2020    Years since quitting: 3.4   Smokeless tobacco: Never  Vaping Use   Vaping status: Never Used  Substance and Sexual Activity   Alcohol use: No   Drug use: No   Sexual activity: Yes    Birth control/protection: None  Other Topics Concern   Not on file  Social History Narrative   Lives alone , has family around her.    Social Drivers of Health   Financial Resource Strain: Medium Risk (03/12/2023)   Overall Financial Resource Strain (CARDIA)    Difficulty of Paying Living Expenses: Somewhat hard  Food Insecurity: Food Insecurity Present (03/12/2023)   Hunger Vital Sign    Worried About Running Out of Food in the Last Year: Sometimes true    Ran Out of Food in the Last Year: Sometimes true  Transportation Needs: No Transportation Needs (03/12/2023)   PRAPARE - Administrator, Civil Service (Medical): No    Lack of Transportation (Non-Medical): No  Physical Activity: Sufficiently  Active (03/12/2023)   Exercise Vital Sign    Days of Exercise per Week: 7 days    Minutes of Exercise per Session: 100 min  Stress: No Stress Concern Present (03/12/2023)  [employer] of Occupational Health - Occupational Stress Questionnaire    Feeling of Stress : Only a little  Social Connections: Moderately Isolated (03/12/2023)   Social Connection and Isolation Panel [NHANES]    Frequency of Communication with Friends and Family: More than three times a week    Frequency of Social Gatherings with Friends and Family: Twice a week    Attends Religious Services: More than 4 times per year    Active Member of [employer] or Organizations: No    Attends Banker Meetings: Never    Marital Status: Separated  Intimate Partner Violence: Not At Risk (03/12/2023)   Humiliation, Afraid, Rape, and Kick questionnaire    Fear of Current or Ex-Partner: No    Emotionally Abused: No    Physically Abused: No    Sexually Abused: No    Outpatient Medications Prior to Visit  Medication Sig Dispense Refill   albuterol (VENTOLIN HFA) 108 (90 Base) MCG/ACT inhaler TAKE 2 PUFFS BY MOUTH EVERY 6 HOURS AS NEEDED FOR WHEEZE OR SHORTNESS OF BREATH 18 each 3   atorvastatin (LIPITOR) 20 MG tablet Take 0.5 tablets (10 mg total) by mouth daily. 90 tablet 2   bisacodyl (GENTLE LAXATIVE) 5 MG EC tablet Take 5 mg by mouth daily as needed for moderate constipation.     cephALEXin (KEFLEX) 500 MG capsule Take 1 capsule (500 mg total) by mouth 4 (four) times daily for 5 days. 20 capsule 0   Cholecalciferol (VITAMIN D) 50 MCG (2000 UT) CAPS Take 100 capsules by mouth 1 day or 1 dose.     clobetasol ointment (TEMOVATE) 0.05 % APPLY TO AFFECTED AREA TWICE A DAY 30 g 0   desvenlafaxine (PRISTIQ) 50 MG 24 hr tablet TAKE 1 TABLET BY MOUTH EVERY DAY 90 tablet 1   doxycycline (VIBRA-TABS) 100 MG tablet Take 1 tablet (100 mg total) by mouth 2 (two) times daily for 5 days. 10 tablet 0   fenoprofen (NALFON) 600 MG TABS  tablet Take 600 mg by mouth 1 day or 1 dose.     hydrocortisone (ANUSOL-HC) 2.5 % rectal cream PLACE 1 APPLICATION RECTALLY 2 (TWO) TIMES DAILY. 30 g 1   meclizine (ANTIVERT) 25 MG tablet Take 1 tablet (25 mg total) by mouth 3 (three) times daily as needed for dizziness. 90 tablet 2   mometasone (ELOCON) 0.1 % cream APPLY DAILY AS DIRECTED-FOR AS NEEDED FOR DRY EAR 45 g 1   Omega-3 Fatty Acids (FISH OIL) 1000 MG CAPS Take 2,000 mg by mouth daily.     prednisoLONE acetate (PRED FORTE) 1 % ophthalmic suspension Place 4 drops into both eyes 2 (two) times daily as needed. Administer 4 drops into each ear twice daily as needed for itching. 5 mL 2   Turmeric (QC TUMERIC COMPLEX PO) Take 3,000 mg by mouth once. Once daily     cyclobenzaprine (FLEXERIL) 5 MG tablet Take 1 tablet (5 mg total) by mouth 3 (three) times daily as needed for muscle spasms. (Patient not taking: Reported on 10/15/2023) 30 tablet 1   levothyroxine (SYNTHROID) 50 MCG tablet Take 1 tablet (50 mcg total) by mouth daily. (Patient not taking: Reported on 10/15/2023) 90 tablet 3   LINZESS 290 MCG CAPS capsule TAKE 1 CAPSULE BY MOUTH EVERY DAY (Patient not taking: Reported on 10/15/2023) 30 capsule 5   UNABLE TO FIND Take 1 Piece of gum by mouth daily. Med Name: SKIN & GUT essential probiotic     UNABLE TO FIND Med  Name: GOLO (Patient not taking: Reported on 10/15/2023)     vitamin B-12 (CYANOCOBALAMIN) 250 MCG tablet Take 250 mcg by mouth daily. (Patient not taking: Reported on 10/15/2023)     levothyroxine (SYNTHROID) 75 MCG tablet TAKE 1 TABLET BY MOUTH EVERY DAY (Patient not taking: Reported on 10/15/2023) 90 tablet 0   No facility-administered medications prior to visit.    Allergies  Allergen Reactions   Venofer [Iron Sucrose] Other (See Comments)    Patient had a hypersensitivity reaction to Venofer.  See progress note from 04/25/21.   Feraheme [Ferumoxytol] Swelling and Rash   Penicillins Hives    Has patient had a PCN reaction  causing immediate rash, facial/tongue/throat swelling, SOB or lightheadedness with hypotension: No Has patient had a PCN reaction causing severe rash involving mucus membranes or skin necrosis: Yes Has patient had a PCN reaction that required hospitalization: No Has patient had a PCN reaction occurring within the last 10 years: No If all of the above answers are "NO", then may proceed with Cephalosporin use.    Morphine And Codeine Itching          ROS Review of Systems  Constitutional:  Negative for chills and fever.  Eyes:  Negative for visual disturbance.  Respiratory:  Negative for chest tightness and shortness of breath.   Neurological:  Negative for dizziness and headaches.      Objective:    Physical Exam HENT:     Head: Normocephalic.     Mouth/Throat:     Mouth: Mucous membranes are moist.  Cardiovascular:     Rate and Rhythm: Normal rate.     Heart sounds: Normal heart sounds.  Pulmonary:     Effort: Pulmonary effort is normal.     Breath sounds: Normal breath sounds.  Neurological:     Mental Status: She is alert.     BP 112/66   Pulse 68   Ht 5\' 1"  (1.549 m)   Wt 135 lb 1.9 oz (61.3 kg)   SpO2 94%   BMI 25.53 kg/m  Wt Readings from Last 3 Encounters:  10/15/23 135 lb 1.9 oz (61.3 kg)  10/11/23 137 lb 12.8 oz (62.5 kg)  06/08/23 135 lb (61.2 kg)    Lab Results  Component Value Date   TSH 4.400 07/19/2023   Lab Results  Component Value Date   WBC 4.7 06/08/2023   HGB 11.6 06/08/2023   HCT 39.2 06/08/2023   MCV 63 (L) 06/08/2023   PLT 296 06/08/2023   Lab Results  Component Value Date   NA 139 06/08/2023   K 4.8 06/08/2023   CO2 25 06/08/2023   GLUCOSE 93 06/08/2023   BUN 16 06/08/2023   CREATININE 0.73 06/08/2023   BILITOT 0.4 06/08/2023   ALKPHOS 104 06/08/2023   AST 23 06/08/2023   ALT 19 06/08/2023   PROT 7.4 06/08/2023   ALBUMIN 5.0 (H) 06/08/2023   CALCIUM 9.5 06/08/2023   ANIONGAP 9 02/11/2020   EGFR 92 06/08/2023   Lab  Results  Component Value Date   CHOL 226 (H) 06/08/2023   Lab Results  Component Value Date   HDL 76 06/08/2023   Lab Results  Component Value Date   LDLCALC 135 (H) 06/08/2023   Lab Results  Component Value Date   TRIG 87 06/08/2023   Lab Results  Component Value Date   CHOLHDL 3.0 06/08/2023   Lab Results  Component Value Date   HGBA1C 5.7 (H) 06/08/2023  Assessment & Plan:  Prediabetes Assessment & Plan: For managing prediabetes, I recommend the following lifestyle changes:  Reduce Intake of High-Sugar Foods and Beverages: Limit foods and drinks high in sugar to help regulate blood sugar levels. Increase Consumption of Nutrient-Rich Foods: Focus on incorporating more fruits, vegetables, and whole grains into your diet. Choose Lean Proteins: Opt for lean proteins such as chicken, fish, beans, and legumes. Select Low-Fat Dairy Products: Choose low-fat or non-fat dairy options. Minimize Saturated Fats, Trans Fats, and Cholesterol: Reduce intake of foods high in saturated fats, trans fatty acids, and cholesterol. Engage in Regular Physical Activity: Aim for at least 30 minutes of brisk walking or other moderate activity at least 5 days a week.  Lifestyle modifications for prediabetes were discussed, including adopting a heart-healthy diet and increasing physical activity. The patient was encouraged to decrease her intake of high-sugar foods and beverages. She verbalized understanding and is aware of the plan of care.   Orders: -     Hemoglobin A1c  Other specified hypothyroidism Assessment & Plan: The patient reports taking Synthroid 50 MCG daily on empty stomach The patient is encouraged to follow up if experiencing symptoms of hypothyroidism, despite compliance with the current treatment regimen, such as fatigue, weight gain, constipation, dry skin, cold intolerance, hair loss, or depression.  Lab Results  Component Value Date   TSH 4.400 07/19/2023      Orders: -     TSH + free T4  Hyperlipidemia, unspecified hyperlipidemia type Assessment & Plan: She takes atorvastatin 20 mg daily and reports treatment compliance The patient was encouraged to make lifestyle changes, including avoiding simple carbohydrates such as cakes, sweet desserts, ice cream, soda (diet or regular), sweet tea, candies, chips, cookies, store-bought juices, excessive alcohol (more than 1-2 drinks per day), lemonade, artificial sweeteners, donuts, coffee creamers, and sugar-free products. Additionally, reducing the consumption of greasy, fatty foods and increasing physical activity were recommended. The patient verbalized understanding and is aware of the plan of care.   Lab Results  Component Value Date   CHOL 226 (H) 06/08/2023   HDL 76 06/08/2023   LDLCALC 135 (H) 06/08/2023   TRIG 87 06/08/2023   CHOLHDL 3.0 06/08/2023     Orders: -     Lipid panel -     CMP14+EGFR -     CBC with Differential/Platelet  Vitamin D deficiency -     VITAMIN D 25 Hydroxy (Vit-D Deficiency, Fractures)  Note: This chart has been completed using Engineer, civil (consulting) software, and while attempts have been made to ensure accuracy, certain words and phrases may not be transcribed as intended.    Follow-up: Return in about 4 months (around 02/12/2024).   Gilmore Laroche, FNP

## 2023-10-15 NOTE — Assessment & Plan Note (Signed)
She takes atorvastatin 20 mg daily and reports treatment compliance The patient was encouraged to make lifestyle changes, including avoiding simple carbohydrates such as cakes, sweet desserts, ice cream, soda (diet or regular), sweet tea, candies, chips, cookies, store-bought juices, excessive alcohol (more than 1-2 drinks per day), lemonade, artificial sweeteners, donuts, coffee creamers, and sugar-free products. Additionally, reducing the consumption of greasy, fatty foods and increasing physical activity were recommended. The patient verbalized understanding and is aware of the plan of care.   Lab Results  Component Value Date   CHOL 226 (H) 06/08/2023   HDL 76 06/08/2023   LDLCALC 135 (H) 06/08/2023   TRIG 87 06/08/2023   CHOLHDL 3.0 06/08/2023

## 2023-10-16 ENCOUNTER — Other Ambulatory Visit: Payer: Self-pay | Admitting: Family Medicine

## 2023-10-16 DIAGNOSIS — E038 Other specified hypothyroidism: Secondary | ICD-10-CM

## 2023-10-16 LAB — CMP14+EGFR
ALT: 11 [IU]/L (ref 0–32)
AST: 18 [IU]/L (ref 0–40)
Albumin: 4.3 g/dL (ref 3.9–4.9)
Alkaline Phosphatase: 81 [IU]/L (ref 44–121)
BUN/Creatinine Ratio: 31 — ABNORMAL HIGH (ref 12–28)
BUN: 20 mg/dL (ref 8–27)
Bilirubin Total: 0.4 mg/dL (ref 0.0–1.2)
CO2: 25 mmol/L (ref 20–29)
Calcium: 9.3 mg/dL (ref 8.7–10.3)
Chloride: 102 mmol/L (ref 96–106)
Creatinine, Ser: 0.64 mg/dL (ref 0.57–1.00)
Globulin, Total: 2 g/dL (ref 1.5–4.5)
Glucose: 83 mg/dL (ref 70–99)
Potassium: 4.3 mmol/L (ref 3.5–5.2)
Sodium: 142 mmol/L (ref 134–144)
Total Protein: 6.3 g/dL (ref 6.0–8.5)
eGFR: 99 mL/min/{1.73_m2} (ref >59)

## 2023-10-16 LAB — CBC WITH DIFFERENTIAL/PLATELET
Basophils Absolute: 0 10*3/uL (ref 0.0–0.2)
Basos: 0 %
EOS (ABSOLUTE): 0.1 10*3/uL (ref 0.0–0.4)
Eos: 3 %
Hematocrit: 35.4 % (ref 34.0–46.6)
Hemoglobin: 10.5 g/dL — ABNORMAL LOW (ref 11.1–15.9)
Immature Grans (Abs): 0 10*3/uL (ref 0.0–0.1)
Immature Granulocytes: 0 %
Lymphocytes Absolute: 1.1 10*3/uL (ref 0.7–3.1)
Lymphs: 35 %
MCH: 18.8 pg — ABNORMAL LOW (ref 26.6–33.0)
MCHC: 29.7 g/dL — ABNORMAL LOW (ref 31.5–35.7)
MCV: 63 fL — ABNORMAL LOW (ref 79–97)
Monocytes Absolute: 0.3 10*3/uL (ref 0.1–0.9)
Monocytes: 10 %
Neutrophils Absolute: 1.6 10*3/uL (ref 1.4–7.0)
Neutrophils: 52 %
Platelets: 264 10*3/uL (ref 150–450)
RBC: 5.59 x10E6/uL — ABNORMAL HIGH (ref 3.77–5.28)
RDW: 14.9 % (ref 11.7–15.4)
WBC: 3.1 10*3/uL — ABNORMAL LOW (ref 3.4–10.8)

## 2023-10-16 LAB — LIPID PANEL
Chol/HDL Ratio: 3 {ratio} (ref 0.0–4.4)
Cholesterol, Total: 152 mg/dL (ref 100–199)
HDL: 51 mg/dL (ref >39)
LDL Chol Calc (NIH): 89 mg/dL (ref 0–99)
Triglycerides: 59 mg/dL (ref 0–149)
VLDL Cholesterol Cal: 12 mg/dL (ref 5–40)

## 2023-10-16 LAB — VITAMIN D 25 HYDROXY (VIT D DEFICIENCY, FRACTURES): Vit D, 25-Hydroxy: 68.7 ng/mL (ref 30.0–100.0)

## 2023-10-16 LAB — TSH+FREE T4
Free T4: 1.04 ng/dL (ref 0.82–1.77)
TSH: 8.49 u[IU]/mL — ABNORMAL HIGH (ref 0.450–4.500)

## 2023-10-16 LAB — HEMOGLOBIN A1C
Est. average glucose Bld gHb Est-mCnc: 111 mg/dL
Hgb A1c MFr Bld: 5.5 % (ref 4.8–5.6)

## 2023-10-16 MED ORDER — LEVOTHYROXINE SODIUM 75 MCG PO TABS: 75.0000 ug | ORAL_TABLET | Freq: Every day | ORAL | 3 refills | Status: DC

## 2023-10-16 NOTE — Progress Notes (Signed)
Please inform the patient to increase her Synthroid dosage to 70 mcg daily, to be taken on an empty stomach, as her thyroid levels indicate subclinical hypothyroidism. She should return for lab work in six weeks, around November 27, 2023, to reassess her thyroid levels.

## 2023-10-29 ENCOUNTER — Other Ambulatory Visit: Payer: Self-pay | Admitting: Family Medicine

## 2023-10-29 DIAGNOSIS — E782 Mixed hyperlipidemia: Secondary | ICD-10-CM

## 2023-10-29 DIAGNOSIS — F419 Anxiety disorder, unspecified: Secondary | ICD-10-CM

## 2023-11-22 ENCOUNTER — Ambulatory Visit: Payer: Medicaid Other | Admitting: Family Medicine

## 2023-11-22 ENCOUNTER — Encounter: Payer: Self-pay | Admitting: Family Medicine

## 2023-11-22 VITALS — BP 136/72 | HR 76 | Temp 98.6°F | Ht 61.0 in | Wt 135.0 lb

## 2023-11-22 DIAGNOSIS — J028 Acute pharyngitis due to other specified organisms: Secondary | ICD-10-CM | POA: Diagnosis not present

## 2023-11-22 DIAGNOSIS — B9789 Other viral agents as the cause of diseases classified elsewhere: Secondary | ICD-10-CM

## 2023-11-22 DIAGNOSIS — B349 Viral infection, unspecified: Secondary | ICD-10-CM | POA: Diagnosis not present

## 2023-11-22 MED ORDER — AZELASTINE-FLUTICASONE 137-50 MCG/ACT NA SUSP: 1.0000 | Freq: Two times a day (BID) | NASAL | 1 refills | Status: AC

## 2023-11-22 MED ORDER — LEVOCETIRIZINE DIHYDROCHLORIDE 5 MG PO TABS
5.0000 mg | ORAL_TABLET | Freq: Every evening | ORAL | 2 refills | Status: DC
Start: 2023-11-22 — End: 2024-03-26

## 2023-11-22 MED ORDER — PROMETHAZINE-DM 6.25-15 MG/5ML PO SYRP: 5.0000 mL | ORAL_SOLUTION | Freq: Four times a day (QID) | ORAL | 0 refills | Status: AC | PRN

## 2023-11-22 NOTE — Progress Notes (Signed)
 Established Patient Office Visit   Subjective  Patient ID: Kathleen Erickson, female    DOB: [birth year]  Age: 63 y.o. MRN: [id number]  Chief Complaint  Patient presents with   Acute Visit    Sore throat, coughing up mucus, causing chest to hurt , fever, ear pain all symptoms started Monday pt. States the ear pain radiates down to throat  pt. Has a sick grandchild she is caring for that has pneumonia she is afraid she has caught the virus     She  has a past medical history of Allergy, Anemia, Anxiety, Arthritis, Cancer (HCC), COPD (chronic obstructive pulmonary disease) (HCC), Depression, Dyspnea, Family history of breast cancer, Family history of lung cancer, Family history of ovarian cancer, Family history of skin cancer, GERD (gastroesophageal reflux disease), Hyperlipidemia, Hypothyroidism, and 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HCC).  Patient complains of persistent cough. Patient describes symptoms of cough, fatigue, malaise, myalgias, left ear pain,  sore throat, sputum production, sweats, and wheezing. Symptoms began 3 days ago and are gradually worsening since that time. Patient denies nausea and vomiting. Treatment thus far includes OTC analgesics/antipyretics: somewhat effective and anti-tussive: somewhat effective Past pulmonary history is significant for  bronchitis and pneumonia     Review of Systems  Constitutional:  Positive for fever and malaise/fatigue.  HENT:  Positive for congestion and sore throat.   Respiratory:  Positive for cough and sputum production. Negative for shortness of breath.   Cardiovascular:  Negative for palpitations.  Neurological:  Negative for headaches.      Objective:     BP 136/72   Pulse 76   Temp 98.6 F (37 C)   Ht 5\' 1"  (1.549 m)   Wt 135 lb 0.1 oz (61.2 kg)   SpO2 96%   BMI 25.51 kg/m  BP Readings from Last 3 Encounters:  11/22/23 136/72  10/15/23 112/66  10/11/23 112/61      Physical Exam Vitals reviewed.  Constitutional:      General:  She is not in acute distress.    Appearance: Normal appearance. She is not ill-appearing, toxic-appearing or diaphoretic.  HENT:     Head: Normocephalic.     Right Ear: Tympanic membrane normal.     Left Ear: Tympanic membrane normal.     Nose: Congestion and rhinorrhea present.     Mouth/Throat:     Mouth: Mucous membranes are moist.     Pharynx: No oropharyngeal exudate or posterior oropharyngeal erythema.  Eyes:     General:        Right eye: No discharge.        Left eye: No discharge.     Conjunctiva/sclera: Conjunctivae normal.  Cardiovascular:     Rate and Rhythm: Normal rate.     Pulses: Normal pulses.     Heart sounds: Normal heart sounds.  Pulmonary:     Effort: Pulmonary effort is normal. No respiratory distress.     Breath sounds: Normal breath sounds.  Musculoskeletal:     Cervical back: Normal range of motion.  Lymphadenopathy:     Cervical: No cervical adenopathy.  Skin:    General: Skin is warm and dry.     Capillary Refill: Capillary refill takes less than 2 seconds.  Neurological:     Mental Status: She is alert.     Coordination: Coordination normal.     Gait: Gait normal.  Psychiatric:        Mood and Affect: Mood normal.        Behavior:  Behavior normal.      No results found for any visits on 11/22/23.  The 10-year ASCVD risk score (Arnett DK, et al., 2019) is: 5%    Assessment & Plan:  Viral illness Assessment & Plan: COVID-19, Flu A+B RSV and Rapid Strep test ordered   Azelastine-fluticasone nasal spray PRN, promethazine syrup PRN, Xyzal 5 mg at bedtime. Advise patient to rest to support your body's recovery. Stay hydrated by drinking water, tea, or broth. Using a humidifier can help soothe throat irritation and ease nasal congestion. For fever or pain, acetaminophen (Tylenol) is recommended. To relieve other symptoms, try saline nasal sprays, throat lozenges, or gargling with saltwater. Focus on eating light, healthy meals like fruits and  vegetables to keep your strength up. Practice good hygiene by washing your hands frequently and covering your mouth when coughing or sneezing.Follow-up for worsening or persistent symptoms. Patient verbalizes understanding regarding plan of care and all questions answered      Orders: -     COVID-19, Flu A+B and RSV -     Promethazine-DM; Take 5 mLs by mouth 4 (four) times daily as needed.  Dispense: 118 mL; Refill: 0 -     Levocetirizine Dihydrochloride; Take 1 tablet (5 mg total) by mouth every evening.  Dispense: 30 tablet; Refill: 2 -     Azelastine-Fluticasone; Place 1 spray into the nose every 12 (twelve) hours.  Dispense: 23 g; Refill: 1  Sore throat (viral) -     Rapid Strep Screen ([facility] ONLY)    Return if symptoms worsen or fail to improve.   Cruzita Lederer Newman Nip, FNP

## 2023-11-22 NOTE — Assessment & Plan Note (Signed)
 COVID-19, Flu A+B RSV and Rapid Strep test ordered   Azelastine-fluticasone nasal spray PRN, promethazine syrup PRN, Xyzal 5 mg at bedtime. Advise patient to rest to support your body's recovery. Stay hydrated by drinking water, tea, or broth. Using a humidifier can help soothe throat irritation and ease nasal congestion. For fever or pain, acetaminophen (Tylenol) is recommended. To relieve other symptoms, try saline nasal sprays, throat lozenges, or gargling with saltwater. Focus on eating light, healthy meals like fruits and vegetables to keep your strength up. Practice good hygiene by washing your hands frequently and covering your mouth when coughing or sneezing.Follow-up for worsening or persistent symptoms. Patient verbalizes understanding regarding plan of care and all questions answered

## 2023-11-22 NOTE — Patient Instructions (Signed)

## 2023-11-24 LAB — COVID-19, FLU A+B AND RSV
Influenza A, NAA: NOT DETECTED
Influenza B, NAA: NOT DETECTED
RSV, NAA: DETECTED — AB
SARS-CoV-2, NAA: NOT DETECTED

## 2023-11-27 LAB — CULTURE, GROUP A STREP

## 2023-11-27 LAB — RAPID STREP SCREEN (MED CTR MEBANE ONLY): Strep Gp A Ag, IA W/Reflex: NEGATIVE

## 2023-11-28 NOTE — Progress Notes (Signed)
 Please inform patient,  Your results are positive for RSV. Treatment is supportive:  Rest and stay hydrated Take Tylenol or Ibuprofen for fever or pain Use saline nasal spray and a humidifier for congestion Monitor for worsening symptoms like difficulty breathing or high fever

## 2023-12-19 ENCOUNTER — Ambulatory Visit: Payer: Self-pay
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
## 2023-12-22 ENCOUNTER — Other Ambulatory Visit: Payer: Self-pay | Admitting: Family Medicine

## 2023-12-22 DIAGNOSIS — L920 Granuloma annulare: Secondary | ICD-10-CM

## 2023-12-26 ENCOUNTER — Ambulatory Visit: Payer: Self-pay | Admitting: Family Medicine

## 2023-12-26 ENCOUNTER — Ambulatory Visit (HOSPITAL_COMMUNITY)
Admission: RE | Admit: 2023-12-26 | Discharge: 2023-12-26 | Disposition: A | Discharge status: Home / Self Care | Source: Ambulatory Visit | Attending: Family Medicine | Admitting: Family Medicine

## 2023-12-26 VITALS — BP 122/74 | HR 76 | Resp 18 | Ht 61.0 in | Wt 137.0 lb

## 2023-12-26 DIAGNOSIS — M5442 Lumbago with sciatica, left side: Secondary | ICD-10-CM | POA: Diagnosis not present

## 2023-12-26 DIAGNOSIS — G8929 Other chronic pain: Secondary | ICD-10-CM | POA: Diagnosis not present

## 2023-12-26 DIAGNOSIS — M542 Cervicalgia: Secondary | ICD-10-CM | POA: Diagnosis not present

## 2023-12-26 DIAGNOSIS — M47816 Spondylosis without myelopathy or radiculopathy, lumbar region: Secondary | ICD-10-CM | POA: Diagnosis not present

## 2023-12-26 DIAGNOSIS — M545 Low back pain, unspecified: Secondary | ICD-10-CM | POA: Diagnosis not present

## 2023-12-26 DIAGNOSIS — M25551 Pain in right hip: Secondary | ICD-10-CM

## 2023-12-26 DIAGNOSIS — M47812 Spondylosis without myelopathy or radiculopathy, cervical region: Secondary | ICD-10-CM | POA: Diagnosis not present

## 2023-12-26 DIAGNOSIS — M546 Pain in thoracic spine: Secondary | ICD-10-CM | POA: Diagnosis not present

## 2023-12-26 MED ORDER — TIZANIDINE HCL 4 MG PO TABS: 4.0000 mg | ORAL_TABLET | Freq: Every day | ORAL | 0 refills | Status: DC

## 2023-12-26 MED ORDER — MELOXICAM 7.5 MG PO TABS: 7.5000 mg | ORAL_TABLET | Freq: Every day | ORAL | 0 refills | Status: DC

## 2023-12-26 NOTE — Patient Instructions (Signed)
 F/u with pCP as before, call if you need to be seen sooner  X rays at hospital today  You are refderred to Dr Dallas Schimke , orthopedics  Meloxicam and tiznadine are prescribed, pai n and muscle relaxer  Thanks for choosing [facility], we consider it a privelige to serve you.

## 2023-12-27 ENCOUNTER — Encounter: Payer: Self-pay | Admitting: Family Medicine

## 2023-12-27 DIAGNOSIS — M25551 Pain in right hip: Secondary | ICD-10-CM | POA: Insufficient documentation

## 2023-12-27 DIAGNOSIS — M542 Cervicalgia: Secondary | ICD-10-CM | POA: Insufficient documentation

## 2023-12-27 DIAGNOSIS — G8929 Other chronic pain: Secondary | ICD-10-CM | POA: Insufficient documentation
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
## 2023-12-27 NOTE — Assessment & Plan Note (Signed)
 X ray hip and Ortho eval
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
## 2023-12-27 NOTE — Assessment & Plan Note (Signed)
 Meloxica, zanaflex, x ray and ortho eval

## 2023-12-28 ENCOUNTER — Ambulatory Visit: Payer: Self-pay

## 2023-12-28 NOTE — Telephone Encounter (Signed)
 Patient requesting to go over imaging results. This RN advised agent to transfer patient to office.   Copied from CRM [id number]. Topic: Clinical - Lab/Test Results >> Dec 28, 2023 11:01 AM Priscille Loveless wrote: Reason for CRM:Pt wants to talk about xray results Reason for Disposition . Health Information question, no triage required and triager able to answer question  Protocols used: Information Only Call - No Triage-A-AH

## 2023-12-31 NOTE — Telephone Encounter (Signed)
Called pt and informed of xray results.

## 2024-01-03 ENCOUNTER — Other Ambulatory Visit: Payer: Self-pay | Admitting: Family Medicine

## 2024-01-03 DIAGNOSIS — G8929 Other chronic pain: Secondary | ICD-10-CM

## 2024-01-03 MED ORDER — TIZANIDINE HCL 4 MG PO TABS: 4.0000 mg | ORAL_TABLET | Freq: Every day | ORAL | 1 refills | Status: AC

## 2024-01-21 DIAGNOSIS — L089 Local infection of the skin and subcutaneous tissue, unspecified: Secondary | ICD-10-CM | POA: Diagnosis not present

## 2024-01-21 DIAGNOSIS — S81801A Unspecified open wound, right lower leg, initial encounter: Secondary | ICD-10-CM | POA: Diagnosis not present

## 2024-01-22 ENCOUNTER — Ambulatory Visit: Admitting: Orthopedic Surgery

## 2024-01-22 DIAGNOSIS — M7061 Trochanteric bursitis, right hip: Secondary | ICD-10-CM | POA: Diagnosis not present

## 2024-01-23 ENCOUNTER — Encounter: Payer: Self-pay | Admitting: Orthopedic Surgery

## 2024-01-23 ENCOUNTER — Other Ambulatory Visit: Payer: Self-pay | Admitting: Internal Medicine
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
## 2024-02-15 ENCOUNTER — Ambulatory Visit: Payer: Medicaid Other | Admitting: Family Medicine

## 2024-02-15 ENCOUNTER — Encounter: Payer: Self-pay | Admitting: Family Medicine

## 2024-02-15 VITALS — BP 129/73 | HR 73 | Ht 61.0 in | Wt 136.1 lb

## 2024-02-15 DIAGNOSIS — E782 Mixed hyperlipidemia: Secondary | ICD-10-CM

## 2024-02-15 DIAGNOSIS — E559 Vitamin D deficiency, unspecified: Secondary | ICD-10-CM | POA: Diagnosis not present

## 2024-02-15 DIAGNOSIS — E7849 Other hyperlipidemia: Secondary | ICD-10-CM | POA: Diagnosis not present

## 2024-02-15 DIAGNOSIS — R7301 Impaired fasting glucose: Secondary | ICD-10-CM | POA: Diagnosis not present

## 2024-02-15 DIAGNOSIS — E038 Other specified hypothyroidism: Secondary | ICD-10-CM | POA: Diagnosis not present

## 2024-02-15 MED ORDER — ATORVASTATIN CALCIUM 20 MG PO TABS: 10.0000 mg | ORAL_TABLET | Freq: Every day | ORAL | 2 refills | Status: DC
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
## 2024-02-15 NOTE — Assessment & Plan Note (Signed)
 The patient reports taking Synthroid  75 MCG daily on empty stomach The patient is encouraged to follow up if experiencing symptoms of hypothyroidism, despite compliance with the current treatment regimen, such as fatigue, weight gain, constipation, dry skin, cold intolerance, hair loss, or depression.  Lab Results  Component Value Date   TSH 8.490 (H) 10/15/2023

## 2024-02-15 NOTE — Assessment & Plan Note (Signed)
 She takes atorvastatin  10 mg daily and reports treatment compliance The patient was encouraged to make lifestyle changes, including avoiding simple carbohydrates such as cakes, sweet desserts, ice cream, soda (diet or regular), sweet tea, candies, chips, cookies, store-bought juices, excessive alcohol (more than 1-2 drinks per day), lemonade, artificial sweeteners, donuts, coffee creamers, and sugar-free products. Additionally, reducing the consumption of greasy, fatty foods and increasing physical activity were recommended. The patient verbalized understanding and is aware of the plan of care.   Lab Results  Component Value Date   CHOL 152 10/15/2023   HDL 51 10/15/2023   LDLCALC 89 10/15/2023   TRIG 59 10/15/2023   CHOLHDL 3.0 10/15/2023

## 2024-02-15 NOTE — Patient Instructions (Addendum)
 I appreciate the opportunity to provide care to you today!    Follow up:  5 months  Labs: please stop by the lab today/ during the week to get your blood drawn (CBC, CMP, TSH, Lipid profile, HgA1c, Vit D)  For a Healthier YOU, I Recommend: Reducing your intake of sugar, sodium, carbohydrates, and saturated fats. Increasing your fiber intake by incorporating more whole grains, fruits, and vegetables into your meals. Setting healthy goals with a focus on lowering your consumption of carbs, sugar, and unhealthy fats. Adding variety to your diet by including a wide range of fruits and vegetables. Cutting back on soda and limiting processed foods as much as possible. Staying active: In addition to taking your weight loss medication, aim for at least 150 minutes of moderate-intensity physical activity each week for optimal results.     Please continue to a heart-healthy diet and increase your physical activities. Try to exercise for at least five days a week.    It was a pleasure to see you and I look forward to continuing to work together on your health and well-being. Please do not hesitate to call the office if you need care or have questions about your care.  In case of emergency, please visit the Emergency Department for urgent care, or contact our clinic at [phone] to schedule an appointment. We're here to help you!   Have a wonderful day and week. With Gratitude, Reyanna Baley MSN, FNP-BC

## 2024-02-16 ENCOUNTER — Ambulatory Visit: Payer: Self-pay | Admitting: Family Medicine

## 2024-02-16 DIAGNOSIS — E038 Other specified hypothyroidism: Secondary | ICD-10-CM

## 2024-02-16 LAB — HEMOGLOBIN A1C
Est. average glucose Bld gHb Est-mCnc: 108 mg/dL
Hgb A1c MFr Bld: 5.4 % (ref 4.8–5.6)

## 2024-02-16 LAB — LIPID PANEL
Chol/HDL Ratio: 3 ratio (ref 0.0–4.4)
Cholesterol, Total: 200 mg/dL — ABNORMAL HIGH (ref 100–199)
HDL: 66 mg/dL (ref >39)
LDL Chol Calc (NIH): 120 mg/dL — ABNORMAL HIGH (ref 0–99)
Triglycerides: 78 mg/dL (ref 0–149)
VLDL Cholesterol Cal: 14 mg/dL (ref 5–40)

## 2024-02-16 LAB — CMP14+EGFR
ALT: 14 IU/L (ref 0–32)
AST: 19 IU/L (ref 0–40)
Albumin: 4.7 g/dL (ref 3.9–4.9)
Alkaline Phosphatase: 81 IU/L (ref 44–121)
BUN/Creatinine Ratio: 24 (ref 12–28)
BUN: 14 mg/dL (ref 8–27)
Bilirubin Total: 0.5 mg/dL (ref 0.0–1.2)
CO2: 24 mmol/L (ref 20–29)
Calcium: 9.3 mg/dL (ref 8.7–10.3)
Chloride: 103 mmol/L (ref 96–106)
Creatinine, Ser: 0.59 mg/dL (ref 0.57–1.00)
Globulin, Total: 2.1 g/dL (ref 1.5–4.5)
Glucose: 88 mg/dL (ref 70–99)
Potassium: 4.7 mmol/L (ref 3.5–5.2)
Sodium: 141 mmol/L (ref 134–144)
Total Protein: 6.8 g/dL (ref 6.0–8.5)
eGFR: 101 mL/min/{1.73_m2} (ref >59)

## 2024-02-16 LAB — CBC WITH DIFFERENTIAL/PLATELET
Basophils Absolute: 0 10*3/uL (ref 0.0–0.2)
Basos: 1 %
EOS (ABSOLUTE): 0.1 10*3/uL (ref 0.0–0.4)
Eos: 2 %
Hematocrit: 39.4 % (ref 34.0–46.6)
Hemoglobin: 11.4 g/dL (ref 11.1–15.9)
Immature Grans (Abs): 0 10*3/uL (ref 0.0–0.1)
Immature Granulocytes: 0 %
Lymphocytes Absolute: 1.3 10*3/uL (ref 0.7–3.1)
Lymphs: 40 %
MCH: 18.8 pg — ABNORMAL LOW (ref 26.6–33.0)
MCHC: 28.9 g/dL — ABNORMAL LOW (ref 31.5–35.7)
MCV: 65 fL — ABNORMAL LOW (ref 79–97)
Monocytes Absolute: 0.5 10*3/uL (ref 0.1–0.9)
Monocytes: 14 %
Neutrophils Absolute: 1.4 10*3/uL (ref 1.4–7.0)
Neutrophils: 43 %
Platelets: 274 10*3/uL (ref 150–450)
RBC: 6.07 x10E6/uL — ABNORMAL HIGH (ref 3.77–5.28)
RDW: 17.5 % — ABNORMAL HIGH (ref 11.7–15.4)
WBC: 3.3 10*3/uL — ABNORMAL LOW (ref 3.4–10.8)

## 2024-02-16 LAB — TSH+FREE T4
Free T4: 1.34 ng/dL (ref 0.82–1.77)
TSH: 0.391 u[IU]/mL — ABNORMAL LOW (ref 0.450–4.500)

## 2024-02-16 LAB — VITAMIN D 25 HYDROXY (VIT D DEFICIENCY, FRACTURES): Vit D, 25-Hydroxy: 78.2 ng/mL (ref 30.0–100.0)

## 2024-03-13 DIAGNOSIS — J069 Acute upper respiratory infection, unspecified: Secondary | ICD-10-CM | POA: Diagnosis not present

## 2024-03-13 DIAGNOSIS — J40 Bronchitis, not specified as acute or chronic: Secondary | ICD-10-CM | POA: Diagnosis not present

## 2024-03-13 DIAGNOSIS — J329 Chronic sinusitis, unspecified: Secondary | ICD-10-CM | POA: Diagnosis not present

## 2024-03-26 ENCOUNTER — Other Ambulatory Visit: Payer: Self-pay | Admitting: Family Medicine

## 2024-03-26 DIAGNOSIS — B349 Viral infection, unspecified: Secondary | ICD-10-CM

## 2024-04-29 ENCOUNTER — Other Ambulatory Visit: Payer: Self-pay | Admitting: Family Medicine

## 2024-04-29 DIAGNOSIS — F419 Anxiety disorder, unspecified: Secondary | ICD-10-CM

## 2024-04-29 DIAGNOSIS — E038 Other specified hypothyroidism: Secondary | ICD-10-CM

## 2024-05-05 ENCOUNTER — Other Ambulatory Visit: Payer: Self-pay | Admitting: *Deleted

## 2024-05-19 ENCOUNTER — Other Ambulatory Visit: Payer: Self-pay | Admitting: Family Medicine

## 2024-05-19 DIAGNOSIS — H9201 Otalgia, right ear: Secondary | ICD-10-CM

## 2024-05-19 DIAGNOSIS — E038 Other specified hypothyroidism: Secondary | ICD-10-CM

## 2024-05-25 ENCOUNTER — Other Ambulatory Visit: Payer: Self-pay | Admitting: Family Medicine

## 2024-05-25 DIAGNOSIS — L920 Granuloma annulare: Secondary | ICD-10-CM

## 2024-05-25 DIAGNOSIS — E038 Other specified hypothyroidism: Secondary | ICD-10-CM

## 2024-05-25 MED ORDER — LEVOTHYROXINE SODIUM 75 MCG PO TABS: 75.0000 ug | ORAL_TABLET | Freq: Every day | ORAL | 3 refills | Status: AC

## 2024-05-25 MED ORDER — CLOBETASOL PROPIONATE 0.05 % EX OINT: TOPICAL_OINTMENT | CUTANEOUS | 0 refills | Status: AC

## 2024-05-28 DIAGNOSIS — J441 Chronic obstructive pulmonary disease with (acute) exacerbation: Secondary | ICD-10-CM | POA: Diagnosis not present

## 2024-05-28 DIAGNOSIS — U071 COVID-19: Secondary | ICD-10-CM | POA: Diagnosis not present

## 2024-06-19 DIAGNOSIS — B370 Candidal stomatitis: Secondary | ICD-10-CM | POA: Diagnosis not present

## 2024-06-27 ENCOUNTER — Other Ambulatory Visit: Payer: Self-pay | Admitting: Gastroenterology

## 2024-07-14 ENCOUNTER — Other Ambulatory Visit: Payer: Self-pay | Admitting: Gastroenterology

## 2024-07-14 ENCOUNTER — Other Ambulatory Visit: Payer: Self-pay | Admitting: Family Medicine

## 2024-07-14 DIAGNOSIS — Z780 Asymptomatic menopausal state: Secondary | ICD-10-CM

## 2024-07-18 ENCOUNTER — Ambulatory Visit (INDEPENDENT_AMBULATORY_CARE_PROVIDER_SITE_OTHER): Admitting: Family Medicine

## 2024-07-18 ENCOUNTER — Encounter: Payer: Self-pay | Admitting: Family Medicine

## 2024-07-18 VITALS — BP 110/68 | HR 71 | Ht 61.0 in | Wt 135.0 lb

## 2024-07-18 DIAGNOSIS — E559 Vitamin D deficiency, unspecified: Secondary | ICD-10-CM

## 2024-07-18 DIAGNOSIS — Z780 Asymptomatic menopausal state: Secondary | ICD-10-CM

## 2024-07-18 DIAGNOSIS — M7062 Trochanteric bursitis, left hip: Secondary | ICD-10-CM | POA: Diagnosis not present

## 2024-07-18 DIAGNOSIS — R7301 Impaired fasting glucose: Secondary | ICD-10-CM | POA: Diagnosis not present

## 2024-07-18 DIAGNOSIS — M21611 Bunion of right foot: Secondary | ICD-10-CM | POA: Diagnosis not present

## 2024-07-18 DIAGNOSIS — F419 Anxiety disorder, unspecified: Secondary | ICD-10-CM

## 2024-07-18 DIAGNOSIS — E782 Mixed hyperlipidemia: Secondary | ICD-10-CM | POA: Diagnosis not present

## 2024-07-18 DIAGNOSIS — M25552 Pain in left hip: Secondary | ICD-10-CM | POA: Diagnosis not present

## 2024-07-18 DIAGNOSIS — Z23 Encounter for immunization: Secondary | ICD-10-CM | POA: Diagnosis not present

## 2024-07-18 DIAGNOSIS — E038 Other specified hypothyroidism: Secondary | ICD-10-CM

## 2024-07-18 MED ORDER — DESVENLAFAXINE SUCCINATE ER 50 MG PO TB24
50.0000 mg | ORAL_TABLET | Freq: Every day | ORAL | 1 refills | Status: AC
Start: 2024-07-18 — End: ?

## 2024-07-18 MED ORDER — MECLIZINE HCL 25 MG PO TABS
25.0000 mg | ORAL_TABLET | Freq: Three times a day (TID) | ORAL | 2 refills | Status: AC | PRN
Start: 2024-07-18 — End: ?

## 2024-07-18 MED ORDER — ATORVASTATIN CALCIUM 20 MG PO TABS: 10.0000 mg | ORAL_TABLET | Freq: Every day | ORAL | 2 refills | Status: AC

## 2024-07-18 MED ORDER — NAPROXEN 500 MG PO TABS: 500.0000 mg | ORAL_TABLET | Freq: Two times a day (BID) | ORAL | 0 refills | Status: DC | PRN

## 2024-07-18 NOTE — Progress Notes (Signed)
 Established Patient Office Visit  Subjective:  Patient ID: Kathleen Erickson, female    DOB: December 13, 1960  Age: 63 y.o. MRN: [id number]  CC:  Chief Complaint  Patient presents with   Medical Management of Chronic Issues    Follow up   Referral    Referral to podiatry    Leg Pain    Left leg pain     HPI Kathleen Erickson is a 63 y.o. female with past medical history of GERD, Hypothyroidism, Hyperlipidemia, prediabetes presents for f/u of  chronic medical conditions.  For the details of today's visit, please refer to the assessment and plan.    Past Medical History:  Diagnosis Date   Allergy    Anemia    thalcemia   Anxiety    Arthritis    Cancer (HCC)    Cervical cancer   COPD (chronic obstructive pulmonary disease) (HCC)    Depression    Dyspnea    Family history of breast cancer    Family history of lung cancer    Family history of ovarian cancer    Family history of skin cancer    GERD (gastroesophageal reflux disease)    Hyperlipidemia    Hypothyroidism    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HCC)    patient report    Past Surgical History:  Procedure Laterality Date   BILATERAL CARPAL TUNNEL RELEASE Bilateral    at least 10 years ago   BIOPSY  08/22/2021   Procedure: BIOPSY;  Surgeon: Shaaron Lamar HERO, MD;  Location: [facility];  Service: Endoscopy;;   carpel tunnel release     bilaterally   CESAREAN SECTION     COLONOSCOPY N/A 06/22/2014   Dr. Rourk:External and internal hemorrhoids-grade 4; otherwise normal rectum total colon and terminal ileum.   COLONOSCOPY WITH PROPOFOL  N/A 11/26/2017   external and internal hemorrhoids, prominent anal papilla, normal rectum and colon, normal TI.    COLONOSCOPY WITH PROPOFOL  N/A 08/22/2021   nonbleeding external and internal hemorrhoids (severe large, and grade 4), 6 mm nonbleeding AVM in the base of the cecum (intervention performed APC, and clip placed), rest of the exam normal.   COLPOSCOPY     ESOPHAGOGASTRODUODENOSCOPY N/A 06/22/2014    Dr. Shaaron:The mucosa of the esophagus appeared normal  Single gastric ulcer ranging between 3-5 mm in size was found   Biopsy with negative H.pylori.    ESOPHAGOGASTRODUODENOSCOPY N/A 10/28/2014   Dr. Shaaron: patulous EG junction, small hiatal hernia, previous noted gastric ulcer healed   ESOPHAGOGASTRODUODENOSCOPY (EGD) WITH PROPOFOL  N/A 11/26/2017   normal esophagus, normal stomach, normal duodenum.    ESOPHAGOGASTRODUODENOSCOPY (EGD) WITH PROPOFOL  N/A 08/22/2021   normal esophagus, normal stomach (minimal polypoid mucosa), normal duodenal bulb, subtle finding somewhat suspicious for hemobilia or pancreaticus. Pathology revealed reactive gastropathy and negative for H. pylori.   GIVENS CAPSULE STUDY N/A 02/14/2021   Procedure: GIVENS CAPSULE STUDY;  Surgeon: Shaaron Lamar HERO, MD;  Location: [facility];  Service: Endoscopy;  Laterality: N/A;  7:30am   HEMOSTASIS CLIP PLACEMENT  08/22/2021   Procedure: HEMOSTASIS CLIP PLACEMENT;  Surgeon: Shaaron Lamar HERO, MD;  Location: [facility];  Service: Endoscopy;;   HOT HEMOSTASIS  08/22/2021   Procedure: HOT HEMOSTASIS (ARGON PLASMA COAGULATION/BICAP);  Surgeon: Shaaron Lamar HERO, MD;  Location: [facility];  Service: Endoscopy;;   VAGINAL HYSTERECTOMY     bleeding; she thinks it was a partial and has ovaries in place    Family History  Problem Relation Age of  Onset   Uterine cancer Mother    Heart disease Mother    Alcohol abuse Mother    Arthritis Mother    COPD Mother    Depression Mother    Ovarian cancer Mother        dx 49s   Heart disease Father        age 64, family questions autopsy   Alcohol abuse Father    Arthritis Father    Depression Father    Early death Father    Cancer Other        ulcer, aunt   Lung cancer Cousin        dx 69s, then again in her 5s (maternal first cousin)   Throat cancer Cousin    Asthma Son    COPD Sister    Lung cancer Maternal Aunt        dx 60s/70s, smoker, ulcer   Cancer Maternal Uncle         dx 15s, unknown primary (affected eye/head, colon, stomach), chewed tobacco   Gout Paternal Uncle    Seizures Paternal Uncle    Lung cancer Maternal Aunt        dx 53s, non smoker   Cancer Maternal Aunt        dx 20s, mouth cancer, chewed tobacco   Skin cancer Maternal Uncle    Skin cancer Maternal Uncle    Breast cancer Niece 26   Cancer Niece        unknown type, dx 30s   Lung cancer Cousin        dx late 24s (maternal first cousin)   Skin cancer Cousin        multiple (maternal first cousin)   Colon cancer Neg Hx    Ulcers Neg Hx     Social History   Socioeconomic History   Marital status: Legally Separated    Spouse name: Vinie   Number of children: 2   Years of education: 10   Highest education level: Not on file  Occupational History   Occupation: not working    Comment: cleaned houses    Comment: disabled from arthritis  Tobacco Use   Smoking status: Former    Current packs/day: 0.00    Average packs/day: 0.3 packs/day for 46.6 years (11.7 ttl pk-yrs)    Types: Cigarettes    Start date: 09/25/1973    Quit date: 05/17/2020    Years since quitting: 4.1   Smokeless tobacco: Never  Vaping Use   Vaping status: Never Used  Substance and Sexual Activity   Alcohol use: No   Drug use: No   Sexual activity: Yes    Birth control/protection: None  Other Topics Concern   Not on file  Social History Narrative   Lives alone , has family around her.    Social Drivers of Health   Financial Resource Strain: Medium Risk (03/12/2023)   Overall Financial Resource Strain (CARDIA)    Difficulty of Paying Living Expenses: Somewhat hard  Food Insecurity: Food Insecurity Present (03/12/2023)   Hunger Vital Sign    Worried About Running Out of Food in the Last Year: Sometimes true    Ran Out of Food in the Last Year: Sometimes true  Transportation Needs: No Transportation Needs (03/12/2023)   PRAPARE - Administrator, Civil Service (Medical): No    Lack of  Transportation (Non-Medical): No  Physical Activity: Sufficiently Active (03/12/2023)   Exercise Vital Sign    Days of Exercise  per Week: 7 days    Minutes of Exercise per Session: 100 min  Stress: No Stress Concern Present (03/12/2023)   [employer] of Occupational Health - Occupational Stress Questionnaire    Feeling of Stress : Only a little  Social Connections: Moderately Isolated (03/12/2023)   Social Connection and Isolation Panel    Frequency of Communication with Friends and Family: More than three times a week    Frequency of Social Gatherings with Friends and Family: Twice a week    Attends Religious Services: More than 4 times per year    Active Member of [employer] or Organizations: No    Attends Banker Meetings: Never    Marital Status: Separated  Intimate Partner Violence: Not At Risk (03/12/2023)   Humiliation, Afraid, Rape, and Kick questionnaire    Fear of Current or Ex-Partner: No    Emotionally Abused: No    Physically Abused: No    Sexually Abused: No    Outpatient Medications Prior to Visit  Medication Sig Dispense Refill   levocetirizine (XYZAL ) 5 MG tablet TAKE 1 TABLET BY MOUTH EVERY DAY IN THE EVENING 30 tablet 2   levothyroxine  (SYNTHROID ) 75 MCG tablet Take 1 tablet (75 mcg total) by mouth daily. 90 tablet 3   atorvastatin  (LIPITOR) 20 MG tablet Take 0.5 tablets (10 mg total) by mouth daily. 90 tablet 2   meclizine  (ANTIVERT ) 25 MG tablet TAKE 1 TABLET BY MOUTH 3 TIMES DAILY AS NEEDED FOR DIZZINESS. 90 tablet 2   albuterol  (VENTOLIN  HFA) 108 (90 Base) MCG/ACT inhaler TAKE 2 PUFFS BY MOUTH EVERY 6 HOURS AS NEEDED FOR WHEEZE OR SHORTNESS OF BREATH 18 each 3   Azelastine -Fluticasone  137-50 MCG/ACT SUSP Place 1 spray into the nose every 12 (twelve) hours. 23 g 1   bisacodyl (GENTLE LAXATIVE) 5 MG EC tablet Take 5 mg by mouth daily as needed for moderate constipation.     Cholecalciferol (VITAMIN D ) 50 MCG (2000 UT) CAPS Take 100 capsules by mouth 1  day or 1 dose.     clobetasol  ointment (TEMOVATE ) 0.05 % APPLY TO AFFECTED AREA TWICE A DAY 30 g 0   hydrocortisone  (ANUSOL -HC) 2.5 % rectal cream PLACE 1 APPLICATION RECTALLY 2 (TWO) TIMES DAILY. 30 g 1   LINZESS  290 MCG CAPS capsule TAKE 1 CAPSULE BY MOUTH EVERY DAY 30 capsule 5   meloxicam  (MOBIC ) 7.5 MG tablet TAKE 1 TABLET BY MOUTH EVERY DAY (Patient not taking: Reported on 02/15/2024) 30 tablet 0   mometasone  (ELOCON ) 0.1 % cream APPLY DAILY AS DIRECTED-FOR AS NEEDED FOR DRY EAR 45 g 1   Omega-3 Fatty Acids (FISH OIL) 1000 MG CAPS Take 2,000 mg by mouth daily.     promethazine -dextromethorphan (PROMETHAZINE -DM) 6.25-15 MG/5ML syrup Take 5 mLs by mouth 4 (four) times daily as needed. 118 mL 0   tiZANidine  (ZANAFLEX ) 4 MG tablet Take 1 tablet (4 mg total) by mouth at bedtime. (Patient not taking: Reported on 02/15/2024) 90 tablet 1   Turmeric (QC TUMERIC COMPLEX PO) Take 3,000 mg by mouth once. Once daily     UNABLE TO FIND Take 1 Piece of gum by mouth daily. Med Name: SKIN & GUT essential probiotic     UNABLE TO FIND Med Name: GOLO     vitamin B-12 (CYANOCOBALAMIN ) 250 MCG tablet Take 250 mcg by mouth daily.     desvenlafaxine  (PRISTIQ ) 50 MG 24 hr tablet TAKE 1 TABLET BY MOUTH EVERY DAY 90 tablet 1   No facility-administered medications  prior to visit.    Allergies  Allergen Reactions   Venofer  [Iron  Sucrose] Other (See Comments)    Patient had a hypersensitivity reaction to Venofer .  See progress note from 04/25/21.   Feraheme  [Ferumoxytol ] Swelling and Rash   Penicillins Hives    Has patient had a PCN reaction causing immediate rash, facial/tongue/throat swelling, SOB or lightheadedness with hypotension: No Has patient had a PCN reaction causing severe rash involving mucus membranes or skin necrosis: Yes Has patient had a PCN reaction that required hospitalization: No Has patient had a PCN reaction occurring within the last 10 years: No If all of the above answers are NO, then may  proceed with Cephalosporin use.    Morphine And Codeine Itching          ROS Review of Systems  Constitutional:  Negative for chills and fever.  Eyes:  Negative for visual disturbance.  Respiratory:  Negative for chest tightness and shortness of breath.   Musculoskeletal:        Left hip pain  Neurological:  Negative for dizziness and headaches.      Objective:    Physical Exam HENT:     Head: Normocephalic.     Mouth/Throat:     Mouth: Mucous membranes are moist.  Cardiovascular:     Rate and Rhythm: Normal rate.     Heart sounds: Normal heart sounds.  Pulmonary:     Effort: Pulmonary effort is normal.     Breath sounds: Normal breath sounds.  Musculoskeletal:     Right foot: Bunion (of the great toe) present.  Neurological:     Mental Status: She is alert.     BP 110/68   Pulse 71   Ht 5' 1 (1.549 m)   Wt 135 lb (61.2 kg)   SpO2 94%   BMI 25.51 kg/m  Wt Readings from Last 3 Encounters:  07/18/24 135 lb (61.2 kg)  02/15/24 136 lb 1.9 oz (61.7 kg)  12/26/23 137 lb 0.6 oz (62.2 kg)    Lab Results  Component Value Date   TSH 0.586 07/18/2024   Lab Results  Component Value Date   WBC 3.8 07/18/2024   HGB 11.5 07/18/2024   HCT 39.7 07/18/2024   MCV 66 (L) 07/18/2024   PLT 266 07/18/2024   Lab Results  Component Value Date   NA 139 07/18/2024   K 4.8 07/18/2024   CO2 26 07/18/2024   GLUCOSE 89 07/18/2024   BUN 14 07/18/2024   CREATININE 0.64 07/18/2024   BILITOT 0.6 07/18/2024   ALKPHOS 88 07/18/2024   AST 22 07/18/2024   ALT 11 07/18/2024   PROT 6.8 07/18/2024   ALBUMIN 4.7 07/18/2024   CALCIUM  9.5 07/18/2024   ANIONGAP 9 02/11/2020   EGFR 98 07/18/2024   Lab Results  Component Value Date   CHOL 184 07/18/2024   Lab Results  Component Value Date   HDL 68 07/18/2024   Lab Results  Component Value Date   LDLCALC 104 (H) 07/18/2024   Lab Results  Component Value Date   TRIG 65 07/18/2024   Lab Results  Component Value Date    CHOLHDL 2.7 07/18/2024   Lab Results  Component Value Date   HGBA1C 5.3 07/18/2024      Assessment & Plan:  Left hip pain Assessment & Plan: The patient reports increased cleaning and organizing activities at home, which elicited pain in the left hip.  Recommendations:  -Rest & Activity: Avoid aggravating movements such as climbing  stairs or lying on the affected side. Gradually resume normal activity as tolerated. -Ice/Heat: Apply ice for 15-20 minutes, 2-3 times daily to reduce inflammation. Transition to gentle heat therapy later to promote muscle relaxation. -Medications: May use NSAIDs (e.g., ibuprofen, naproxen , or diclofenac) or topical diclofenac gel as needed for pain relief. -Physical Therapy: Engage in stretching and strengthening exercises targeting the gluteal muscles, iliotibial (IT) band, and core. Address gait or posture abnormalities as needed.    Bunion of great toe of right foot Assessment & Plan: The patient reports a bunion on the right foot present for 2 or more years. She notes increasing discomfort when wearing shoes, particularly after walking or having shoes on for an extended period.   Orders: -     Ambulatory referral to Podiatry  Trochanteric bursitis of left hip -     Naproxen ; Take 1 tablet (500 mg total) by mouth 2 (two) times daily as needed for moderate pain (pain score 4-6).  Dispense: 30 tablet; Refill: 0  Encounter for immunization Assessment & Plan: Patient educated on CDC recommendation for the vaccine. Verbal consent was obtained from the patient, vaccine administered by nurse, no sign of adverse reactions noted at this time. Patient education on arm soreness and use of tylenol  or ibuprofen for this patient  was discussed. Patient educated on the signs and symptoms of adverse effect and advise to contact the office if they occur.   Orders: -     Flu vaccine HIGH DOSE PF(Fluzone Trivalent)  Menopause -     Meclizine  HCl; Take 1 tablet (25  mg total) by mouth 3 (three) times daily as needed for dizziness.  Dispense: 90 tablet; Refill: 2  Anxiety -     Desvenlafaxine  Succinate ER; Take 1 tablet (50 mg total) by mouth daily.  Dispense: 90 tablet; Refill: 1  Mixed hyperlipidemia -     Atorvastatin  Calcium ; Take 0.5 tablets (10 mg total) by mouth daily.  Dispense: 90 tablet; Refill: 2  IFG (impaired fasting glucose) -     Hemoglobin A1c  Vitamin D  deficiency -     VITAMIN D  25 Hydroxy (Vit-D Deficiency, Fractures)  TSH (thyroid -stimulating hormone deficiency) -     TSH + free T4 -     Lipid panel -     CMP14+EGFR -     CBC with Differential/Platelet  Note: This chart has been completed using Engineer, Civil (consulting) software, and while attempts have been made to ensure accuracy, certain words and phrases may not be transcribed as intended.    Follow-up: Return in about 4 months (around 11/18/2024).   Jakobie Henslee  Z Bacchus, FNP

## 2024-07-18 NOTE — Patient Instructions (Addendum)
 I appreciate the opportunity to provide care to you today!    Follow up: 4 months  Labs: please stop by the lab today to get your blood drawn (CBC, CMP, TSH, Lipid profile, HgA1c, Vit D)   Schedule medicare annual wellness  Greater trochanteric bursitis of left hip  -Rest & Activity: Avoid aggravating movements (stairs, lying on affected side); resume gradually. -Ice/Heat: Ice 15-20 min, 2-3/day for inflammation; gentle heat later for muscle relaxation. -Medications: Use NSAIDs (ibuprofen, naproxen , diclofenac) or topical diclofenac gel for pain relief. -Physical Therapy: Stretch and strengthen gluteal, IT band, and core muscles; correct gait/posture; consider ultrasound or shockwave therapy.  Nonpharmacologic Treatment - Bunion (Right Great Toe) -Footwear:  wear wide-toe shoes; avoid heels and tight shoes. -Padding/Orthotics: Use bunion pads, toe spacers, or custom insoles. -Activity: Limit pressure on the toe; maintain healthy weight. -Exercises: Toe stretches, towel scrunches, and ROM exercises. -Splints: Night splints to align the toe. -Ice: 10-15 min for pain/swelling relief. -Foot Care: Keep feet clean; check for irritation or calluses.   Please follow up if your symptoms worsen or fail to improve.   Referrals today-  Podiatry   Please continue to a heart-healthy diet and increase your physical activities. Try to exercise for at least five days a week.    It was a pleasure to see you and I look forward to continuing to work together on your health and well-being. Please do not hesitate to call the office if you need care or have questions about your care.  In case of emergency, please visit the Emergency Department for urgent care, or contact our clinic at [phone] to schedule an appointment. We're here to help you!   Have a wonderful day and week. With Gratitude, Meade JENEANE Gerlach MSN, FNP-BC, PMHNP-BC

## 2024-07-19 LAB — CMP14+EGFR
ALT: 11 IU/L (ref 0–32)
AST: 22 IU/L (ref 0–40)
Albumin: 4.7 g/dL (ref 3.9–4.9)
Alkaline Phosphatase: 88 IU/L (ref 49–135)
BUN/Creatinine Ratio: 22 (ref 12–28)
BUN: 14 mg/dL (ref 8–27)
Bilirubin Total: 0.6 mg/dL (ref 0.0–1.2)
CO2: 26 mmol/L (ref 20–29)
Calcium: 9.5 mg/dL (ref 8.7–10.3)
Chloride: 101 mmol/L (ref 96–106)
Creatinine, Ser: 0.64 mg/dL (ref 0.57–1.00)
Globulin, Total: 2.1 g/dL (ref 1.5–4.5)
Glucose: 89 mg/dL (ref 70–99)
Potassium: 4.8 mmol/L (ref 3.5–5.2)
Sodium: 139 mmol/L (ref 134–144)
Total Protein: 6.8 g/dL (ref 6.0–8.5)
eGFR: 98 mL/min/1.73 (ref >59)

## 2024-07-19 LAB — LIPID PANEL
Chol/HDL Ratio: 2.7 ratio (ref 0.0–4.4)
Cholesterol, Total: 184 mg/dL (ref 100–199)
HDL: 68 mg/dL (ref >39)
LDL Chol Calc (NIH): 104 mg/dL — ABNORMAL HIGH (ref 0–99)
Triglycerides: 65 mg/dL (ref 0–149)
VLDL Cholesterol Cal: 12 mg/dL (ref 5–40)

## 2024-07-19 LAB — VITAMIN D 25 HYDROXY (VIT D DEFICIENCY, FRACTURES): Vit D, 25-Hydroxy: 48.7 ng/mL (ref 30.0–100.0)

## 2024-07-19 LAB — CBC WITH DIFFERENTIAL/PLATELET
Basophils Absolute: 0 x10E3/uL (ref 0.0–0.2)
Basos: 1 %
EOS (ABSOLUTE): 0.1 x10E3/uL (ref 0.0–0.4)
Eos: 2 %
Hematocrit: 39.7 % (ref 34.0–46.6)
Hemoglobin: 11.5 g/dL (ref 11.1–15.9)
Immature Grans (Abs): 0 x10E3/uL (ref 0.0–0.1)
Immature Granulocytes: 0 %
Lymphocytes Absolute: 1.6 x10E3/uL (ref 0.7–3.1)
Lymphs: 42 %
MCH: 19 pg — ABNORMAL LOW (ref 26.6–33.0)
MCHC: 29 g/dL — ABNORMAL LOW (ref 31.5–35.7)
MCV: 66 fL — ABNORMAL LOW (ref 79–97)
Monocytes Absolute: 0.4 x10E3/uL (ref 0.1–0.9)
Monocytes: 11 %
Neutrophils Absolute: 1.7 x10E3/uL (ref 1.4–7.0)
Neutrophils: 44 %
Platelets: 266 x10E3/uL (ref 150–450)
RBC: 6.06 x10E6/uL — ABNORMAL HIGH (ref 3.77–5.28)
RDW: 17.3 % — ABNORMAL HIGH (ref 11.7–15.4)
WBC: 3.8 x10E3/uL (ref 3.4–10.8)

## 2024-07-19 LAB — TSH+FREE T4
Free T4: 1.3 ng/dL (ref 0.82–1.77)
TSH: 0.586 u[IU]/mL (ref 0.450–4.500)

## 2024-07-19 LAB — HEMOGLOBIN A1C
Est. average glucose Bld gHb Est-mCnc: 105 mg/dL
Hgb A1c MFr Bld: 5.3 % (ref 4.8–5.6)

## 2024-07-20 DIAGNOSIS — Z23 Encounter for immunization: Secondary | ICD-10-CM | POA: Insufficient documentation

## 2024-07-20 DIAGNOSIS — M21611 Bunion of right foot: Secondary | ICD-10-CM | POA: Insufficient documentation

## 2024-07-20 NOTE — Assessment & Plan Note (Signed)
 Patient educated on CDC recommendation for the vaccine. Verbal consent was obtained from the patient, vaccine administered by nurse, no sign of adverse reactions noted at this time. Patient education on arm soreness and use of tylenol or ibuprofen for this patient  was discussed. Patient educated on the signs and symptoms of adverse effect and advise to contact the office if they occur.

## 2024-07-20 NOTE — Assessment & Plan Note (Addendum)
 The patient reports increased cleaning and organizing activities at home, which elicited pain in the left hip.  Recommendations:  -Rest & Activity: Avoid aggravating movements such as climbing stairs or lying on the affected side. Gradually resume normal activity as tolerated. -Ice/Heat: Apply ice for 15-20 minutes, 2-3 times daily to reduce inflammation. Transition to gentle heat therapy later to promote muscle relaxation. -Medications: May use NSAIDs (e.g., ibuprofen, naproxen , or diclofenac) or topical diclofenac gel as needed for pain relief. -Physical Therapy: Engage in stretching and strengthening exercises targeting the gluteal muscles, iliotibial (IT) band, and core. Address gait or posture abnormalities as needed.

## 2024-07-20 NOTE — Assessment & Plan Note (Signed)
 The patient reports a bunion on the right foot present for 2 or more years. She notes increasing discomfort when wearing shoes, particularly after walking or having shoes on for an extended period.

## 2024-07-24 ENCOUNTER — Telehealth: Payer: Self-pay | Admitting: Family Medicine

## 2024-07-24 NOTE — Telephone Encounter (Signed)
 Copied from CRM #[id number]. Topic: Clinical - Lab/Test Results >> Jul 24, 2024  8:43 AM Olam RAMAN wrote: Reason for CRM: Pt calling for her lab results from last week. CB [phone] (M) Does not have access to mychart did not want a reset pw >> Jul 24, 2024  8:53 AM Geni HERO wrote: Sent message to [facility] Primary clinical staff to results of labs

## 2024-07-24 NOTE — Telephone Encounter (Signed)
 Provider out of office until Monday, will call once provider reviews

## 2024-07-31 ENCOUNTER — Ambulatory Visit (INDEPENDENT_AMBULATORY_CARE_PROVIDER_SITE_OTHER): Admitting: Podiatry

## 2024-07-31 ENCOUNTER — Encounter: Payer: Self-pay | Admitting: Podiatry

## 2024-07-31 ENCOUNTER — Ambulatory Visit (INDEPENDENT_AMBULATORY_CARE_PROVIDER_SITE_OTHER)

## 2024-07-31 DIAGNOSIS — M21621 Bunionette of right foot: Secondary | ICD-10-CM

## 2024-07-31 DIAGNOSIS — L600 Ingrowing nail: Secondary | ICD-10-CM

## 2024-07-31 DIAGNOSIS — M21619 Bunion of unspecified foot: Secondary | ICD-10-CM

## 2024-07-31 DIAGNOSIS — M21611 Bunion of right foot: Secondary | ICD-10-CM

## 2024-07-31 NOTE — Progress Notes (Signed)
 Subjective:   Patient ID: Kathleen Erickson, female   DOB: 63 y.o.   MRN: [id number]   HPI Patient states that she has a painful bunion of the right first metatarsal and right fifth metatarsal and states that she has family history of this and has been going on for several years.  States she has tried wider shoes she has tried soaks without relief of symptoms and is looking for a more permanent correction of the deformity.  Patient does not smoke likes to be active   Review of Systems  All other systems reviewed and are negative.       Objective:  Physical Exam Vitals and nursing note reviewed.  Constitutional:      Appearance: She is well-developed.  Pulmonary:     Effort: Pulmonary effort is normal.  Musculoskeletal:        General: Normal range of motion.  Skin:    General: Skin is warm.  Neurological:     Mental Status: She is alert.     Neurovascular status intact muscle strength was found to be adequate range of motion adequate with patient found to have hyperostosis with enlargement of the first metatarsal head right with redness around the joint surface and hyperostosis around the fifth metatarsal head right with redness and pain.  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  I did discuss different treatment options and she has tried numerous conservative and would like to get this corrected not recommended distal osteotomy right first metatarsal and osteotomy fifth metatarsal right.  She will come back in December to go over in greater detail and tentatively is scheduled for surgery in January and I reviewed the correction and recovery with her today  X-rays indicate elevation of the 1 2 intermetatarsal angle of approximately 15 degrees right  and elevation of the 4 5 angle right

## 2024-08-01 ENCOUNTER — Other Ambulatory Visit: Payer: Self-pay | Admitting: Family Medicine

## 2024-08-01 DIAGNOSIS — M7062 Trochanteric bursitis, left hip: Secondary | ICD-10-CM

## 2024-08-06 ENCOUNTER — Other Ambulatory Visit: Payer: Self-pay | Admitting: Family Medicine

## 2024-08-06 DIAGNOSIS — J302 Other seasonal allergic rhinitis: Secondary | ICD-10-CM

## 2024-08-08 ENCOUNTER — Telehealth: Payer: Self-pay | Admitting: Podiatry

## 2024-08-08 NOTE — Telephone Encounter (Signed)
 Called and spoke with patient to try and set date for surgery. She was not home at the time so she stated she would contact me back to do so.

## 2024-08-12 ENCOUNTER — Telehealth: Payer: Self-pay

## 2024-08-12 NOTE — Telephone Encounter (Signed)
 Copied from CRM [id number]. Topic: Clinical - Lab/Test Results >> Aug 12, 2024  8:42 AM Lonell PEDLAR wrote: Reason for CRM: Patient is requesting someone to contact her regarding recent labs [phone]

## 2024-08-12 NOTE — Telephone Encounter (Signed)
 Kathleen Erickson has not reviewed labs

## 2024-08-12 NOTE — Telephone Encounter (Signed)
 Called and left voicemail to schedule surgery, patient had called and left message to schedule.

## 2024-08-17 ENCOUNTER — Ambulatory Visit: Payer: Self-pay | Admitting: Family Medicine

## 2024-09-12 ENCOUNTER — Ambulatory Visit: Admitting: Podiatry

## 2024-09-22 ENCOUNTER — Other Ambulatory Visit: Payer: Self-pay | Admitting: Family Medicine

## 2024-09-22 DIAGNOSIS — M7062 Trochanteric bursitis, left hip: Secondary | ICD-10-CM

## 2024-11-21 ENCOUNTER — Ambulatory Visit: Admitting: Family Medicine
